# Patient Record
Sex: Female | Born: 1985 | Race: Black or African American | Hispanic: No | Marital: Married | Smoking: Never smoker
Health system: Southern US, Community
[De-identification: ages and names within clinical notes are randomized; demographics above are authoritative.]

## PROBLEM LIST (undated history)

## (undated) ENCOUNTER — Inpatient Hospital Stay (HOSPITAL_COMMUNITY): Payer: Self-pay

## (undated) DIAGNOSIS — F32A Depression, unspecified: Secondary | ICD-10-CM

## (undated) DIAGNOSIS — F419 Anxiety disorder, unspecified: Secondary | ICD-10-CM

## (undated) DIAGNOSIS — K219 Gastro-esophageal reflux disease without esophagitis: Secondary | ICD-10-CM

## (undated) DIAGNOSIS — R87629 Unspecified abnormal cytological findings in specimens from vagina: Secondary | ICD-10-CM

## (undated) DIAGNOSIS — B009 Herpesviral infection, unspecified: Secondary | ICD-10-CM

## (undated) HISTORY — DX: Gastro-esophageal reflux disease without esophagitis: K21.9

## (undated) HISTORY — DX: Anxiety disorder, unspecified: F41.9

## (undated) HISTORY — PX: WISDOM TOOTH EXTRACTION: SHX21

## (undated) HISTORY — PX: CERVICAL BIOPSY  W/ LOOP ELECTRODE EXCISION: SUR135

## (undated) HISTORY — DX: Herpesviral infection, unspecified: B00.9

---

## 2013-04-21 ENCOUNTER — Encounter (HOSPITAL_COMMUNITY): Payer: Self-pay | Admitting: Emergency Medicine

## 2013-04-21 ENCOUNTER — Emergency Department (HOSPITAL_COMMUNITY)
Admission: EM | Admit: 2013-04-21 | Discharge: 2013-04-21 | Disposition: A | Discharge status: Home / Self Care | Payer: 59 | Attending: Emergency Medicine | Admitting: Emergency Medicine

## 2013-04-21 DIAGNOSIS — Z3202 Encounter for pregnancy test, result negative: Secondary | ICD-10-CM | POA: Insufficient documentation

## 2013-04-21 DIAGNOSIS — R197 Diarrhea, unspecified: Secondary | ICD-10-CM | POA: Insufficient documentation

## 2013-04-21 DIAGNOSIS — B349 Viral infection, unspecified: Secondary | ICD-10-CM

## 2013-04-21 DIAGNOSIS — R5381 Other malaise: Secondary | ICD-10-CM | POA: Insufficient documentation

## 2013-04-21 DIAGNOSIS — R5383 Other fatigue: Secondary | ICD-10-CM

## 2013-04-21 DIAGNOSIS — B9789 Other viral agents as the cause of diseases classified elsewhere: Secondary | ICD-10-CM | POA: Insufficient documentation

## 2013-04-21 LAB — COMPREHENSIVE METABOLIC PANEL
ALBUMIN: 3.5 g/dL (ref 3.5–5.2)
ALT: 21 U/L (ref 0–35)
AST: 21 U/L (ref 0–37)
Alkaline Phosphatase: 57 U/L (ref 39–117)
BUN: 12 mg/dL (ref 6–23)
CALCIUM: 9.2 mg/dL (ref 8.4–10.5)
CO2: 21 meq/L (ref 19–32)
CREATININE: 0.77 mg/dL (ref 0.50–1.10)
Chloride: 99 mEq/L (ref 96–112)
GFR calc Af Amer: >90 mL/min (ref >90)
Glucose, Bld: 88 mg/dL (ref 70–99)
Potassium: 3.7 mEq/L (ref 3.7–5.3)
SODIUM: 136 meq/L — AB (ref 137–147)
TOTAL PROTEIN: 7.6 g/dL (ref 6.0–8.3)
Total Bilirubin: 0.2 mg/dL — ABNORMAL LOW (ref 0.3–1.2)

## 2013-04-21 LAB — CBC WITH DIFFERENTIAL/PLATELET
BASOS ABS: 0 10*3/uL (ref 0.0–0.1)
Basophils Relative: 0 % (ref 0–1)
EOS ABS: 0 10*3/uL (ref 0.0–0.7)
Eosinophils Relative: 1 % (ref 0–5)
HCT: 40.8 % (ref 36.0–46.0)
Hemoglobin: 14 g/dL (ref 12.0–15.0)
LYMPHS PCT: 25 % (ref 12–46)
Lymphs Abs: 1.6 10*3/uL (ref 0.7–4.0)
MCH: 30.2 pg (ref 26.0–34.0)
MCHC: 34.3 g/dL (ref 30.0–36.0)
MCV: 88.1 fL (ref 78.0–100.0)
Monocytes Absolute: 0.6 10*3/uL (ref 0.1–1.0)
Monocytes Relative: 10 % (ref 3–12)
Neutro Abs: 4.2 10*3/uL (ref 1.7–7.7)
Neutrophils Relative %: 64 % (ref 43–77)
PLATELETS: 191 10*3/uL (ref 150–400)
RBC: 4.63 MIL/uL (ref 3.87–5.11)
RDW: 12.4 % (ref 11.5–15.5)
WBC: 6.5 10*3/uL (ref 4.0–10.5)

## 2013-04-21 LAB — LIPASE, BLOOD: Lipase: 21 U/L (ref 11–59)

## 2013-04-21 LAB — URINALYSIS, ROUTINE W REFLEX MICROSCOPIC
Bilirubin Urine: NEGATIVE
Glucose, UA: NEGATIVE mg/dL
Hgb urine dipstick: NEGATIVE
Ketones, ur: NEGATIVE mg/dL
LEUKOCYTES UA: NEGATIVE
Nitrite: NEGATIVE
PH: 6 (ref 5.0–8.0)
PROTEIN: NEGATIVE mg/dL
Specific Gravity, Urine: 1.022 (ref 1.005–1.030)
Urobilinogen, UA: 0.2 mg/dL (ref 0.0–1.0)

## 2013-04-21 LAB — PREGNANCY, URINE: Preg Test, Ur: NEGATIVE

## 2013-04-21 MED ORDER — LOPERAMIDE HCL 2 MG PO CAPS: 2.0000 mg | ORAL_CAPSULE | ORAL | Status: DC | PRN

## 2013-04-21 MED ORDER — SODIUM CHLORIDE 0.9 % IV SOLN
INTRAVENOUS | Status: DC
  Administered 2013-04-21: 13:00:00 via INTRAVENOUS

## 2013-04-21 MED ORDER — ONDANSETRON 8 MG PO TBDP: 8.0000 mg | ORAL_TABLET | Freq: Three times a day (TID) | ORAL | Status: DC | PRN

## 2013-04-21 MED ORDER — ONDANSETRON HCL 4 MG/2ML IJ SOLN
4.0000 mg | Freq: Once | INTRAMUSCULAR | Status: AC
  Administered 2013-04-21: 4 mg via INTRAVENOUS
  Filled 2013-04-21: qty 2

## 2013-04-21 MED ORDER — SODIUM CHLORIDE 0.9 % IV BOLUS (SEPSIS)
1000.0000 mL | Freq: Once | INTRAVENOUS | Status: AC
  Administered 2013-04-21: 1000 mL via INTRAVENOUS

## 2013-04-21 NOTE — Discharge Instructions (Signed)
Viral Gastroenteritis Viral gastroenteritis is also known as stomach flu. This condition affects the stomach and intestinal tract. It can cause sudden diarrhea and vomiting. The illness typically lasts 3 to 8 days. Most people develop an immune response that eventually gets rid of the virus. While this natural response develops, the virus can make you quite ill. CAUSES  Many different viruses can cause gastroenteritis, such as rotavirus or noroviruses. You can catch one of these viruses by consuming contaminated food or water. You may also catch a virus by sharing utensils or other personal items with an infected person or by touching a contaminated surface. SYMPTOMS  The most common symptoms are diarrhea and vomiting. These problems can cause a severe loss of body fluids (dehydration) and a body salt (electrolyte) imbalance. Other symptoms may include:  Fever.  Headache.  Fatigue.  Abdominal pain. DIAGNOSIS  Your caregiver can usually diagnose viral gastroenteritis based on your symptoms and a physical exam. A stool sample may also be taken to test for the presence of viruses or other infections. TREATMENT  This illness typically goes away on its own. Treatments are aimed at rehydration. The most serious cases of viral gastroenteritis involve vomiting so severely that you are not able to keep fluids down. In these cases, fluids must be given through an intravenous line (IV). HOME CARE INSTRUCTIONS   Drink enough fluids to keep your urine clear or pale yellow. Drink small amounts of fluids frequently and increase the amounts as tolerated.  Ask your caregiver for specific rehydration instructions.  Avoid:  Foods high in sugar.  Alcohol.  Carbonated drinks.  Tobacco.  Juice.  Caffeine drinks.  Extremely hot or cold fluids.  Fatty, greasy foods.  Too much intake of anything at one time.  Dairy products until 24 to 48 hours after diarrhea stops.  You may consume probiotics.  Probiotics are active cultures of beneficial bacteria. They may lessen the amount and number of diarrheal stools in adults. Probiotics can be found in yogurt with active cultures and in supplements.  Wash your hands well to avoid spreading the virus.  Only take over-the-counter or prescription medicines for pain, discomfort, or fever as directed by your caregiver. Do not give aspirin to children. Antidiarrheal medicines are not recommended.  Ask your caregiver if you should continue to take your regular prescribed and over-the-counter medicines.  Keep all follow-up appointments as directed by your caregiver. SEEK IMMEDIATE MEDICAL CARE IF:   You are unable to keep fluids down.  You do not urinate at least once every 6 to 8 hours.  You develop shortness of breath.  You notice blood in your stool or vomit. This may look like coffee grounds.  You have abdominal pain that increases or is concentrated in one small area (localized).  You have persistent vomiting or diarrhea.  You have a fever.  The patient is a child younger than 3 months, and he or she has a fever.  The patient is a child older than 3 months, and he or she has a fever and persistent symptoms.  The patient is a child older than 3 months, and he or she has a fever and symptoms suddenly get worse.  The patient is a baby, and he or she has no tears when crying. MAKE SURE YOU:   Understand these instructions.  Will watch your condition.  Will get help right away if you are not doing well or get worse. Document Released: 02/05/2005 Document Revised: 04/30/2011 Document Reviewed: 11/22/2010   [employer] Patient Information 2014 [employer]  

## 2013-04-21 NOTE — ED Notes (Signed)
Pt states taht she has been having NV since Saturday and diarrhea that started today.

## 2013-04-21 NOTE — ED Provider Notes (Signed)
CSN: [id number]     Arrival date & time 04/21/13  1125 History   First MD Initiated Contact with Patient 04/21/13 1142     Chief Complaint  Patient presents with  . Nausea  . Emesis  . Diarrhea     (Consider location/radiation/quality/duration/timing/severity/associated sxs/prior Treatment) Patient is a 28 y.o. female presenting with vomiting and diarrhea. The history is provided by the patient.  Emesis Associated symptoms: diarrhea   Diarrhea Associated symptoms: vomiting    patient here with 3 days of watery diarrhea and emesis x1. Notes some weakness without fever or chills. No URI symptoms. Denies any dysuria or hematuria. Had a normal menstrual cycle week ago. Possible bad food exposure. Symptoms persisted and no treatment used for them prior to arrival. No prior history of same. Denies any rashes. Denies any syncope or near-syncope.  History reviewed. No pertinent past medical history. History reviewed. No pertinent past surgical history. History reviewed. No pertinent family history. History  Substance Use Topics  . Smoking status: Never Smoker   . Smokeless tobacco: Not on file  . Alcohol Use: No   OB History   Grav Para Term Preterm Abortions TAB SAB Ect Mult Living                 Review of Systems  Gastrointestinal: Positive for vomiting and diarrhea.  All other systems reviewed and are negative.      Allergies  Review of patient's allergies indicates no known allergies.  Home Medications   Current Outpatient Rx  Name  Route  Sig  Dispense  Refill  . etonogestrel-ethinyl estradiol (NUVARING) 0.12-0.015 MG/24HR vaginal ring   Vaginal   Place 1 each vaginally every 28 (twenty-eight) days. Insert vaginally and leave in place for 3 consecutive weeks, then remove for 1 week.         . Multiple Vitamins-Minerals (MULTIVITAMIN WITH MINERALS) tablet   Oral   Take 1 tablet by mouth daily.         . valACYclovir (VALTREX) 500 MG tablet   Oral   Take 500  mg by mouth daily as needed (outbreak).          BP 127/75  Pulse 106  Temp(Src) 98.4 F (36.9 C) (Oral)  Resp 18  SpO2 97%  LMP 04/16/2013 Physical Exam  Nursing note and vitals reviewed. Constitutional: She is oriented to person, place, and time. She appears well-developed and well-nourished.  Non-toxic appearance. No distress.  HENT:  Head: Normocephalic and atraumatic.  Eyes: Conjunctivae, EOM and lids are normal. Pupils are equal, round, and reactive to light.  Neck: Normal range of motion. Neck supple. No tracheal deviation present. No mass present.  Cardiovascular: Normal rate, regular rhythm and normal heart sounds.  Exam reveals no gallop.   No murmur heard. Pulmonary/Chest: Effort normal and breath sounds normal. No stridor. No respiratory distress. She has no decreased breath sounds. She has no wheezes. She has no rhonchi. She has no rales.  Abdominal: Soft. Normal appearance and bowel sounds are normal. She exhibits no distension. There is no tenderness. There is no rebound and no CVA tenderness.  Musculoskeletal: Normal range of motion. She exhibits no edema and no tenderness.  Neurological: She is alert and oriented to person, place, and time. She has normal strength. No cranial nerve deficit or sensory deficit. GCS eye subscore is 4. GCS verbal subscore is 5. GCS motor subscore is 6.  Skin: Skin is warm and dry. No abrasion and no rash noted.  Psychiatric: She has a normal mood and affect. Her speech is normal and behavior is normal.    ED Course  Procedures (including critical care time) Labs Review Labs Reviewed - No data to display Imaging Review No results found.   EKG Interpretation None      MDM   Final diagnoses:  None    She was given IV fluids and anti-medics here. She feels better at this time. Suspect viral illness and she is stable for discharge    Toy Baker, MD 04/21/13 1407

## 2013-12-04 ENCOUNTER — Other Ambulatory Visit (HOSPITAL_COMMUNITY)
Admission: RE | Admit: 2013-12-04 | Discharge: 2013-12-04 | Disposition: A | Discharge status: Home / Self Care | Payer: 59 | Source: Ambulatory Visit | Attending: Obstetrics & Gynecology | Admitting: Obstetrics & Gynecology

## 2013-12-04 ENCOUNTER — Other Ambulatory Visit: Payer: Self-pay | Admitting: Obstetrics & Gynecology

## 2013-12-04 DIAGNOSIS — Z113 Encounter for screening for infections with a predominantly sexual mode of transmission: Secondary | ICD-10-CM | POA: Diagnosis present

## 2013-12-04 DIAGNOSIS — Z01411 Encounter for gynecological examination (general) (routine) with abnormal findings: Secondary | ICD-10-CM | POA: Insufficient documentation

## 2013-12-04 DIAGNOSIS — Z1151 Encounter for screening for human papillomavirus (HPV): Secondary | ICD-10-CM | POA: Diagnosis present

## 2013-12-07 LAB — CYTOLOGY - PAP

## 2015-11-04 ENCOUNTER — Other Ambulatory Visit (INDEPENDENT_AMBULATORY_CARE_PROVIDER_SITE_OTHER): Payer: 59

## 2015-11-04 ENCOUNTER — Ambulatory Visit (INDEPENDENT_AMBULATORY_CARE_PROVIDER_SITE_OTHER): Payer: 59 | Admitting: Nurse Practitioner

## 2015-11-04 ENCOUNTER — Encounter: Payer: Self-pay | Admitting: Nurse Practitioner

## 2015-11-04 VITALS — BP 112/80 | HR 84 | Ht 64.0 in | Wt 184.0 lb

## 2015-11-04 DIAGNOSIS — G4700 Insomnia, unspecified: Secondary | ICD-10-CM

## 2015-11-04 DIAGNOSIS — Z0001 Encounter for general adult medical examination with abnormal findings: Secondary | ICD-10-CM | POA: Diagnosis not present

## 2015-11-04 DIAGNOSIS — R6889 Other general symptoms and signs: Secondary | ICD-10-CM

## 2015-11-04 DIAGNOSIS — Z0000 Encounter for general adult medical examination without abnormal findings: Secondary | ICD-10-CM

## 2015-11-04 DIAGNOSIS — F40243 Fear of flying: Secondary | ICD-10-CM

## 2015-11-04 DIAGNOSIS — F339 Major depressive disorder, recurrent, unspecified: Secondary | ICD-10-CM | POA: Insufficient documentation

## 2015-11-04 DIAGNOSIS — F411 Generalized anxiety disorder: Secondary | ICD-10-CM | POA: Insufficient documentation

## 2015-11-04 LAB — LIPID PANEL
CHOL/HDL RATIO: 3
Cholesterol: 180 mg/dL (ref 0–200)
HDL: 55.4 mg/dL (ref >39.00)
LDL CALC: 114 mg/dL — AB (ref 0–99)
NonHDL: 124.34
Triglycerides: 54 mg/dL (ref 0.0–149.0)
VLDL: 10.8 mg/dL (ref 0.0–40.0)

## 2015-11-04 LAB — TSH: TSH: 1.87 u[IU]/mL (ref 0.35–4.50)

## 2015-11-04 LAB — CBC WITH DIFFERENTIAL/PLATELET
BASOS ABS: 0 10*3/uL (ref 0.0–0.1)
Basophils Relative: 0.3 % (ref 0.0–3.0)
EOS ABS: 0.1 10*3/uL (ref 0.0–0.7)
Eosinophils Relative: 1.2 % (ref 0.0–5.0)
HCT: 40 % (ref 36.0–46.0)
Hemoglobin: 13.6 g/dL (ref 12.0–15.0)
Lymphocytes Relative: 38.1 % (ref 12.0–46.0)
Lymphs Abs: 3.5 10*3/uL (ref 0.7–4.0)
MCHC: 33.9 g/dL (ref 30.0–36.0)
MCV: 90 fl (ref 78.0–100.0)
MONOS PCT: 6.9 % (ref 3.0–12.0)
Monocytes Absolute: 0.6 10*3/uL (ref 0.1–1.0)
NEUTROS ABS: 4.9 10*3/uL (ref 1.4–7.7)
Neutrophils Relative %: 53.5 % (ref 43.0–77.0)
PLATELETS: 291 10*3/uL (ref 150.0–400.0)
RBC: 4.44 Mil/uL (ref 3.87–5.11)
RDW: 13.8 % (ref 11.5–15.5)
WBC: 9.2 10*3/uL (ref 4.0–10.5)

## 2015-11-04 LAB — COMPREHENSIVE METABOLIC PANEL
ALT: 12 U/L (ref 0–35)
AST: 14 U/L (ref 0–37)
Albumin: 4.1 g/dL (ref 3.5–5.2)
Alkaline Phosphatase: 58 U/L (ref 39–117)
BILIRUBIN TOTAL: 0.4 mg/dL (ref 0.2–1.2)
BUN: 11 mg/dL (ref 6–23)
CHLORIDE: 103 meq/L (ref 96–112)
CO2: 29 meq/L (ref 19–32)
Calcium: 9.2 mg/dL (ref 8.4–10.5)
Creatinine, Ser: 0.86 mg/dL (ref 0.40–1.20)
GFR: 100.75 mL/min (ref >60.00)
GLUCOSE: 89 mg/dL (ref 70–99)
Potassium: 4.3 mEq/L (ref 3.5–5.1)
Sodium: 139 mEq/L (ref 135–145)
Total Protein: 7.7 g/dL (ref 6.0–8.3)

## 2015-11-04 LAB — HEMOGLOBIN A1C: Hgb A1c MFr Bld: 5.4 % (ref 4.6–6.5)

## 2015-11-04 MED ORDER — SUVOREXANT 10 MG PO TABS: 10.0000 mg | ORAL_TABLET | Freq: Every evening | ORAL | 1 refills | Status: DC | PRN

## 2015-11-04 NOTE — Patient Instructions (Signed)
Insomnia Insomnia is a sleep disorder that makes it difficult to fall asleep or to stay asleep. Insomnia can cause tiredness (fatigue), low energy, difficulty concentrating, mood swings, and poor performance at work or school.  There are three different ways to classify insomnia:  Difficulty falling asleep.  Difficulty staying asleep.  Waking up too early in the morning. Any type of insomnia can be long-term (chronic) or short-term (acute). Both are common. Short-term insomnia usually lasts for three months or less. Chronic insomnia occurs at least three times a week for longer than three months. CAUSES  Insomnia may be caused by another condition, situation, or substance, such as:  Anxiety.  Certain medicines.  Gastroesophageal reflux disease (GERD) or other gastrointestinal conditions.  Asthma or other breathing conditions.  Restless legs syndrome, sleep apnea, or other sleep disorders.  Chronic pain.  Menopause. This may include hot flashes.  Stroke.  Abuse of alcohol, tobacco, or illegal drugs.  Depression.  Caffeine.   Neurological disorders, such as Alzheimer disease.  An overactive thyroid (hyperthyroidism). The cause of insomnia may not be known. RISK FACTORS Risk factors for insomnia include:  Gender. Women are more commonly affected than men.  Age. Insomnia is more common as you get older.  Stress. This may involve your professional or personal life.  Income. Insomnia is more common in people with lower income.  Lack of exercise.   Irregular work schedule or night shifts.  Traveling between different time zones. SIGNS AND SYMPTOMS If you have insomnia, trouble falling asleep or trouble staying asleep is the main symptom. This may lead to other symptoms, such as:  Feeling fatigued.  Feeling nervous about going to sleep.  Not feeling rested in the morning.  Having trouble concentrating.  Feeling irritable, anxious, or depressed. TREATMENT   Treatment for insomnia depends on the cause. If your insomnia is caused by an underlying condition, treatment will focus on addressing the condition. Treatment may also include:   Medicines to help you sleep.  Counseling or therapy.  Lifestyle adjustments. HOME CARE INSTRUCTIONS   Take medicines only as directed by your health care provider.  Keep regular sleeping and waking hours. Avoid naps.  Keep a sleep diary to help you and your health care provider figure out what could be causing your insomnia. Include:   When you sleep.  When you wake up during the night.  How well you sleep.   How rested you feel the next day.  Any side effects of medicines you are taking.  What you eat and drink.   Make your bedroom a comfortable place where it is easy to fall asleep:  Put up shades or special blackout curtains to block light from outside.  Use a white noise machine to block noise.  Keep the temperature cool.   Exercise regularly as directed by your health care provider. Avoid exercising right before bedtime.  Use relaxation techniques to manage stress. Ask your health care provider to suggest some techniques that may work well for you. These may include:  Breathing exercises.  Routines to release muscle tension.  Visualizing peaceful scenes.  Cut back on alcohol, caffeinated beverages, and cigarettes, especially close to bedtime. These can disrupt your sleep.  Do not overeat or eat spicy foods right before bedtime. This can lead to digestive discomfort that can make it hard for you to sleep.  Limit screen use before bedtime. This includes:  Watching TV.  Using your smartphone, tablet, and computer.  Stick to a routine. This  can help you fall asleep faster. Try to do a quiet activity, brush your teeth, and go to bed at the same time each night.  Get out of bed if you are still awake after 15 minutes of trying to sleep. Keep the lights down, but try reading or  doing a quiet activity. When you feel sleepy, go back to bed.  Make sure that you drive carefully. Avoid driving if you feel very sleepy.  Keep all follow-up appointments as directed by your health care provider. This is important. SEEK MEDICAL CARE IF:   You are tired throughout the day or have trouble in your daily routine due to sleepiness.  You continue to have sleep problems or your sleep problems get worse. SEEK IMMEDIATE MEDICAL CARE IF:   You have serious thoughts about hurting yourself or someone else.   This information is not intended to replace advice given to you by your health care provider. Make sure you discuss any questions you have with your health care provider.   Document Released: 02/03/2000 Document Revised: 10/27/2014 Document Reviewed: 11/06/2013 Elsevier Interactive Patient Education 2016 [place] Maintenance, Female Adopting a healthy lifestyle and getting preventive care can go a long way to promote health and wellness. Talk with your health care provider about what schedule of regular examinations is right for you. This is a good chance for you to check in with your provider about disease prevention and staying healthy. In between checkups, there are plenty of things you can do on your own. Experts have done a lot of research about which lifestyle changes and preventive measures are most likely to keep you healthy. Ask your health care provider for more information. WEIGHT AND DIET  Eat a healthy diet  Be sure to include plenty of vegetables, fruits, low-fat dairy products, and lean protein.  Do not eat a lot of foods high in solid fats, added sugars, or salt.  Get regular exercise. This is one of the most important things you can do for your health.  Most adults should exercise for at least 150 minutes each week. The exercise should increase your heart rate and make you sweat (moderate-intensity exercise).  Most adults should also do  strengthening exercises at least twice a week. This is in addition to the moderate-intensity exercise.  Maintain a healthy weight  Body mass index (BMI) is a measurement that can be used to identify possible weight problems. It estimates body fat based on height and weight. Your health care provider can help determine your BMI and help you achieve or maintain a healthy weight.  For females 68 years of age and older:   A BMI below 18.5 is considered underweight.  A BMI of 18.5 to 24.9 is normal.  A BMI of 25 to 29.9 is considered overweight.  A BMI of 30 and above is considered obese.  Watch levels of cholesterol and blood lipids  You should start having your blood tested for lipids and cholesterol at 28 years of age, then have this test every 5 years.  You may need to have your cholesterol levels checked more often if:  Your lipid or cholesterol levels are high.  You are older than 28 years of age.  You are at high risk for heart disease.  CANCER SCREENING   Lung Cancer  Lung cancer screening is recommended for adults 20-6 years old who are at high risk for lung cancer because of a history of smoking.  A yearly low-dose  CT scan of the lungs is recommended for people who:  Currently smoke.  Have quit within the past 15 years.  Have at least a 30-pack-year history of smoking. A pack year is smoking an average of one pack of cigarettes a day for 1 year.  Yearly screening should continue until it has been 15 years since you quit.  Yearly screening should stop if you develop a health problem that would prevent you from having lung cancer treatment.  Breast Cancer  Practice breast self-awareness. This means understanding how your breasts normally appear and feel.  It also means doing regular breast self-exams. Let your health care provider know about any changes, no matter how small.  If you are in your 20s or 30s, you should have a clinical breast exam (CBE) by a  health care provider every 1-3 years as part of a regular health exam.  If you are 9 or older, have a CBE every year. Also consider having a breast X-ray (mammogram) every year.  If you have a family history of breast cancer, talk to your health care provider about genetic screening.  If you are at high risk for breast cancer, talk to your health care provider about having an MRI and a mammogram every year.  Breast cancer gene (BRCA) assessment is recommended for women who have family members with BRCA-related cancers. BRCA-related cancers include:  Breast.  Ovarian.  Tubal.  Peritoneal cancers.  Results of the assessment will determine the need for genetic counseling and BRCA1 and BRCA2 testing. Cervical Cancer Your health care provider may recommend that you be screened regularly for cancer of the pelvic organs (ovaries, uterus, and vagina). This screening involves a pelvic examination, including checking for microscopic changes to the surface of your cervix (Pap test). You may be encouraged to have this screening done every 3 years, beginning at age 67.  For women ages 31-65, health care providers may recommend pelvic exams and Pap testing every 3 years, or they may recommend the Pap and pelvic exam, combined with testing for human papilloma virus (HPV), every 5 years. Some types of HPV increase your risk of cervical cancer. Testing for HPV may also be done on women of any age with unclear Pap test results.  Other health care providers may not recommend any screening for nonpregnant women who are considered low risk for pelvic cancer and who do not have symptoms. Ask your health care provider if a screening pelvic exam is right for you.  If you have had past treatment for cervical cancer or a condition that could lead to cancer, you need Pap tests and screening for cancer for at least 20 years after your treatment. If Pap tests have been discontinued, your risk factors (such as having a  new sexual partner) need to be reassessed to determine if screening should resume. Some women have medical problems that increase the chance of getting cervical cancer. In these cases, your health care provider may recommend more frequent screening and Pap tests. Colorectal Cancer  This type of cancer can be detected and often prevented.  Routine colorectal cancer screening usually begins at 28 years of age and continues through 28 years of age.  Your health care provider may recommend screening at an earlier age if you have risk factors for colon cancer.  Your health care provider may also recommend using home test kits to check for hidden blood in the stool.  A small camera at the end of a tube can be  used to examine your colon directly (sigmoidoscopy or colonoscopy). This is done to check for the earliest forms of colorectal cancer.  Routine screening usually begins at age 27.  Direct examination of the colon should be repeated every 5-10 years through 28 years of age. However, you may need to be screened more often if early forms of precancerous polyps or small growths are found. Skin Cancer  Check your skin from head to toe regularly.  Tell your health care provider about any new moles or changes in moles, especially if there is a change in a mole's shape or color.  Also tell your health care provider if you have a mole that is larger than the size of a pencil eraser.  Always use sunscreen. Apply sunscreen liberally and repeatedly throughout the day.  Protect yourself by wearing long sleeves, pants, a wide-brimmed hat, and sunglasses whenever you are outside. HEART DISEASE, DIABETES, AND HIGH BLOOD PRESSURE   High blood pressure causes heart disease and increases the risk of stroke. High blood pressure is more likely to develop in:  People who have blood pressure in the high end of the normal range (130-139/85-89 mm Hg).  People who are overweight or obese.  People who are  African American.  If you are 68-17 years of age, have your blood pressure checked every 3-5 years. If you are 107 years of age or older, have your blood pressure checked every year. You should have your blood pressure measured twice--once when you are at a hospital or clinic, and once when you are not at a hospital or clinic. Record the average of the two measurements. To check your blood pressure when you are not at a hospital or clinic, you can use:  An automated blood pressure machine at a pharmacy.  A home blood pressure monitor.  If you are between 1 years and 74 years old, ask your health care provider if you should take aspirin to prevent strokes.  Have regular diabetes screenings. This involves taking a blood sample to check your fasting blood sugar level.  If you are at a normal weight and have a low risk for diabetes, have this test once every three years after 28 years of age.  If you are overweight and have a high risk for diabetes, consider being tested at a younger age or more often. PREVENTING INFECTION  Hepatitis B  If you have a higher risk for hepatitis B, you should be screened for this virus. You are considered at high risk for hepatitis B if:  You were born in a country where hepatitis B is common. Ask your health care provider which countries are considered high risk.  Your parents were born in a high-risk country, and you have not been immunized against hepatitis B (hepatitis B vaccine).  You have HIV or AIDS.  You use needles to inject street drugs.  You live with someone who has hepatitis B.  You have had sex with someone who has hepatitis B.  You get hemodialysis treatment.  You take certain medicines for conditions, including cancer, organ transplantation, and autoimmune conditions. Hepatitis C  Blood testing is recommended for:  Everyone born from [birth year] through 1963.  Anyone with known risk factors for hepatitis C. Sexually transmitted infections  (STIs)  You should be screened for sexually transmitted infections (STIs) including gonorrhea and chlamydia if:  You are sexually active and are younger than 28 years of age.  You are older than 28 years of age and  your health care provider tells you that you are at risk for this type of infection.  Your sexual activity has changed since you were last screened and you are at an increased risk for chlamydia or gonorrhea. Ask your health care provider if you are at risk.  If you do not have HIV, but are at risk, it may be recommended that you take a prescription medicine daily to prevent HIV infection. This is called pre-exposure prophylaxis (PrEP). You are considered at risk if:  You are sexually active and do not regularly use condoms or know the HIV status of your partner(s).  You take drugs by injection.  You are sexually active with a partner who has HIV. Talk with your health care provider about whether you are at high risk of being infected with HIV. If you choose to begin PrEP, you should first be tested for HIV. You should then be tested every 3 months for as long as you are taking PrEP.  PREGNANCY   If you are premenopausal and you may become pregnant, ask your health care provider about preconception counseling.  If you may become pregnant, take 400 to 800 micrograms (mcg) of folic acid every day.  If you want to prevent pregnancy, talk to your health care provider about birth control (contraception). OSTEOPOROSIS AND MENOPAUSE   Osteoporosis is a disease in which the bones lose minerals and strength with aging. This can result in serious bone fractures. Your risk for osteoporosis can be identified using a bone density scan.  If you are 71 years of age or older, or if you are at risk for osteoporosis and fractures, ask your health care provider if you should be screened.  Ask your health care provider whether you should take a calcium or vitamin D supplement to lower your risk  for osteoporosis.  Menopause may have certain physical symptoms and risks.  Hormone replacement therapy may reduce some of these symptoms and risks. Talk to your health care provider about whether hormone replacement therapy is right for you.  HOME CARE INSTRUCTIONS   Schedule regular health, dental, and eye exams.  Stay current with your immunizations.   Do not use any tobacco products including cigarettes, chewing tobacco, or electronic cigarettes.  If you are pregnant, do not drink alcohol.  If you are breastfeeding, limit how much and how often you drink alcohol.  Limit alcohol intake to no more than 1 drink per day for nonpregnant women. One drink equals 12 ounces of beer, 5 ounces of wine, or 1 ounces of hard liquor.  Do not use street drugs.  Do not share needles.  Ask your health care provider for help if you need support or information about quitting drugs.  Tell your health care provider if you often feel depressed.  Tell your health care provider if you have ever been abused or do not feel safe at home.   This information is not intended to replace advice given to you by your health care provider. Make sure you discuss any questions you have with your health care provider.   Document Released: 08/21/2010 Document Revised: 02/26/2014 Document Reviewed: 01/07/2013 Elsevier Interactive Patient Education [employer].

## 2015-11-04 NOTE — Assessment & Plan Note (Signed)
Normal TSH, CBC and CMP. epworth sleepiness scale of 0. Start Belsomra. Follow-up in 6 weeks.

## 2015-11-04 NOTE — Assessment & Plan Note (Signed)
Prescribe Xanax 0.25mg  (#4tabs) during next office visit

## 2015-11-04 NOTE — Progress Notes (Signed)
Subjective:    Patient ID: Amy [name], female    DOB: 10/04/1985, 30 y.o.   MRN: [id number]  Patient presents today for complete physical or establish care (new patient) and insomnia  Insomnia  Primary symptoms: fragmented sleep, sleep disturbance, no difficulty falling asleep, no somnolence, frequent awakening, premature morning awakening, no malaise/fatigue, napping.   The current episode started more than one month (over 4yrs). The onset quality is gradual. The problem occurs nightly. The problem is unchanged. The symptoms are aggravated by work stress and caffeine. How many beverages per day that contain caffeine: 0 - 1.  Types of beverages you drink: coffee. Past treatments include medication (benadryl and melatonin). The treatment provided no relief. Typical bedtime:  Shift work-varies.  How long after going to bed to you fall asleep: 15-30 minutes.   Duration of naps:  Two to four hours.  PMH includes: no hypertension, no depression, no family stress or anxiety, no restless leg syndrome, work related stressors, no chronic pain, no apnea. Prior diagnostic workup includes:  No prior workup.  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 Has difficulty staying at asleep even on her days off.  Also reports the fear of flying; associated symptoms are diaphoresis, palpitations, throat fullness, and uncontrollable crying. Has an upcoming trip to last status in November, will like to have a prescription to help cope with trip. Denies any suicide or homicidal ideation, no history depression, no history of substance abuse.  Immunizations: (TDAP, Hep C screen, Pneumovax, Influenza, zoster)  Health Maintenance  Topic Date Due  . Flu Shot  05/19/2016*  . HIV Screening  10/20/2016*  . Tetanus Vaccine  10/26/2016*  . Pap Smear  11/21/2017  *Topic was postponed. The date shown is not the original due date.   Diet:heart healthy Weight: not concerned Wt Readings  from Last 3 Encounters:  11/04/15 184 lb (83.5 kg)   Exercise:3-4times a week, running, she is training for 5K coming up in October 2017 Home Safety:no concern Depression/Suicide:denies No flowsheet data found. No flowsheet data found. Pap Smear (every 75yrs for >21-29 without HPV, every 36yrs for >30-25yrs with HPV):up to date, last done 2016 (normal per patient), had an abnormal 67yrs ago, LIPS procedure done. Vision:up to date, corrective lens used Dental:up todate Sexual History (birth control, marital status, STD):married, no children, sexually active, use of condoms  Medications and allergies reviewed with patient and updated if appropriate.  Patient Active Problem List   Diagnosis Date Noted  . Insomnia 11/04/2015  . 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 Multiple Vitamins-Minerals (MULTIVITAMIN WITH MINERALS) tablet Take 1 tablet by mouth daily.    . valACYclovir (VALTREX) 500 MG tablet Take 500 mg by mouth daily as needed (outbreak).    . ondansetron (ZOFRAN ODT) 8 MG disintegrating tablet Take 1 tablet (8 mg total) by mouth every 8 (eight) hours as needed for nausea or vomiting. (Patient not taking: Reported on 11/04/2015) 20 tablet 0   No current facility-administered medications on file prior to visit.     Past Medical History:  Diagnosis Date  . Anxiety    with flying only  . Herpes     Past Surgical History:  Procedure Laterality Date  . CERVICAL BIOPSY  W/ LOOP ELECTRODE EXCISION      Social History   Social History  . Marital status: Married  Spouse name: N/A  . Number of children: N/A  . Years of education: N/A   Social History Main Topics  . Smoking status: Never Smoker  . Smokeless tobacco: Never Used  . Alcohol use Yes     Comment: social  . Drug use: No  . Sexual activity: Yes    Birth control/ protection: Condom   Other Topics Concern  . None   Social  History Narrative  . None    Family History  Problem Relation Age of Onset  . Hypertension Mother   . Hyperlipidemia Mother   . Hyperlipidemia Father   . Alcohol abuse Father   . Cirrhosis Father   . Hypertension Maternal Aunt   . Heart disease Paternal Grandmother         Review of Systems  Constitutional: Negative for fever, malaise/fatigue and weight loss.  HENT: Negative for congestion and sore throat.   Eyes:       Negative for visual changes  Respiratory: Negative for apnea, cough and shortness of breath.   Cardiovascular: Negative for chest pain, palpitations and leg swelling.  Gastrointestinal: Negative for abdominal pain, blood in stool, constipation, diarrhea, heartburn, melena, nausea and vomiting.  Genitourinary: Negative for dysuria, frequency and urgency.  Musculoskeletal: Negative for falls, joint pain and myalgias.  Skin: Negative for rash.  Neurological: Negative for dizziness, sensory change and headaches.  Endo/Heme/Allergies: Negative for environmental allergies. Does not bruise/bleed easily.  Psychiatric/Behavioral: Positive for sleep disturbance. Negative for depression, substance abuse and suicidal ideas. The patient is nervous/anxious and has insomnia.        Has anxiety and panic attacks when on plane. [place] upcoming [place]    Objective:   Vitals:   11/04/15 0858  BP: 112/80  Pulse: 84    Body mass index is 31.58 kg/m.   Physical Examination:  Physical Exam  Constitutional: She is oriented to person, place, and time and well-developed, well-nourished, and in no distress. No distress.  HENT:  Right Ear: External ear normal.  Left Ear: External ear normal.  Nose: Nose normal.  Mouth/Throat: No oropharyngeal exudate.  Eyes: Conjunctivae and EOM are normal. Pupils are equal, round, and reactive to light. No scleral icterus.  Neck: Normal range of motion. Neck supple. No thyromegaly present.  Cardiovascular: Normal  rate, regular rhythm, normal heart sounds and intact distal pulses.   Pulmonary/Chest: Effort normal and breath sounds normal. Right breast exhibits no mass, no nipple discharge and no skin change. Left breast exhibits no mass, no nipple discharge and no skin change. Breasts are symmetrical.  Abdominal: Soft. Bowel sounds are normal. She exhibits no distension. There is no tenderness.  Genitourinary: Rectum normal and vulva normal. Cervix exhibits no motion tenderness.  Musculoskeletal: Normal range of motion. She exhibits no edema or tenderness.  Lymphadenopathy:    She has no cervical adenopathy.  Neurological: She is alert and oriented to person, place, and time. Gait normal.  Skin: Skin is warm and dry.  Psychiatric: Affect and judgment normal.  Vitals reviewed.   ASSESSMENT and PLAN:  Amy [name] was seen today for new patient (initial visit).  Diagnoses and all orders for this visit:  Encounter for preventative adult health care exam with abnormal findings -     TSH; Future -     CBC w/Diff; Future -     Comprehensive metabolic panel; Future -     Lipid Profile; Future -     Hemoglobin A1c; Future  Insomnia -  Suvorexant (BELSOMRA) 10 MG TABS; Take 10 mg by mouth at bedtime as needed.  Fear of flying   Insomnia Normal TSH, CBC and CMP. epworth sleepiness scale of 0. Start Belsomra. Follow-up in 6 weeks.  Fear of flying Prescribe Xanax 0.25mg  (#4tabs) during next office visit     Follow up: Return in about 6 weeks (around 12/16/2015) for insominia, anxiety. Give prescription of xanax for panic attacks during flights.  Alysia Penna, NP

## 2015-11-04 NOTE — Progress Notes (Signed)
Abnormal Normal CBC. Normal CMP Hemoglobin A1c indicates prediabetes. Lipid panel normal except mild elevation of LDL. Encourage heart healthy diet, regular exercise. Will Repeat labs in 6 months.

## 2015-11-04 NOTE — Progress Notes (Signed)
Pre visit review using our clinic review tool, if applicable. No additional management support is needed unless otherwise documented below in the visit note. 

## 2015-12-16 ENCOUNTER — Ambulatory Visit (INDEPENDENT_AMBULATORY_CARE_PROVIDER_SITE_OTHER): Payer: 59 | Admitting: Nurse Practitioner

## 2015-12-16 ENCOUNTER — Encounter: Payer: Self-pay | Admitting: Nurse Practitioner

## 2015-12-16 VITALS — BP 108/76 | HR 88 | Temp 97.6°F | Ht 64.0 in | Wt 183.0 lb

## 2015-12-16 DIAGNOSIS — F40243 Fear of flying: Secondary | ICD-10-CM | POA: Diagnosis not present

## 2015-12-16 MED ORDER — ALPRAZOLAM 0.5 MG PO TABS: 0.5000 mg | ORAL_TABLET | Freq: Two times a day (BID) | ORAL | 0 refills | Status: DC | PRN

## 2015-12-16 NOTE — Progress Notes (Signed)
Subjective:  Patient ID: Amy [name], female    DOB: October 31, 1985  Age: 30 y.o. MRN: [id number]  CC: Medication Refill (Pt stated need medication )   Anxiety  Presents for initial visit. Onset was at an unknown time. The problem has been unchanged. Symptoms include feeling of choking, hyperventilation, muscle tension, nausea, nervous/anxious behavior, palpitations, panic and restlessness. Patient reports no suicidal ideas. Episode frequency: when flying. The severity of symptoms is causing significant distress. The symptoms are aggravated by specific phobias (flying). The quality of sleep is good. Nighttime awakenings: none.   There are no known risk factors. Her past medical history is significant for anxiety/panic attacks. There is no history of asthma, chronic lung disease, depression or suicide attempts.  Amy [name] has an upcoming flight in 2 weeks to [facility] and another trip to [place] next year. In the past she has not been able to tolerate trips by plane without significant distress.  Insomnia: Current use of Belsomra 10mg  which has been very helpful. She denies any adverse side effects. She reports improved sleep.  Outpatient Medications Prior to Visit  Medication Sig Dispense Refill  . Multiple Vitamins-Minerals (MULTIVITAMIN WITH MINERALS) tablet Take 1 tablet by mouth daily.    . Suvorexant (BELSOMRA) 10 MG TABS Take 10 mg by mouth at bedtime as needed. 30 tablet 1  . valACYclovir (VALTREX) 500 MG tablet Take 500 mg by mouth daily as needed (outbreak).    . ondansetron (ZOFRAN ODT) 8 MG disintegrating tablet Take 1 tablet (8 mg total) by mouth every 8 (eight) hours as needed for nausea or vomiting. (Patient not taking: Reported on 11/04/2015) 20 tablet 0   No facility-administered medications prior to visit.     ROS See HPI  Objective:  BP 108/76 (BP Location: Left Arm, Patient Position: Sitting, Cuff Size: Normal)   Pulse 88   Temp 97.6 F (36.4 C)   Ht  5\' 4"  (1.626 m)   Wt 183 lb (83 kg)   SpO2 99%   BMI 31.41 kg/m   BP Readings from Last 3 Encounters:  12/16/15 108/76  11/04/15 112/80  04/21/13 113/61    Wt Readings from Last 3 Encounters:  12/16/15 183 lb (83 kg)  11/04/15 184 lb (83.5 kg)    Physical Exam  Constitutional: She is oriented to person, place, and time.  Neck: Normal range of motion. Neck supple.  Cardiovascular: Normal rate.   Pulmonary/Chest: Effort normal.  Musculoskeletal: Normal range of motion. She exhibits no edema.  Neurological: She is alert and oriented to person, place, and time.  Skin: Skin is warm and dry.  Psychiatric: She has a normal mood and affect. Her behavior is normal.  Vitals reviewed.   Lab Results  Component Value Date   WBC 9.2 11/04/2015   HGB 13.6 11/04/2015   HCT 40.0 11/04/2015   PLT 291.0 11/04/2015   GLUCOSE 89 11/04/2015   CHOL 180 11/04/2015   TRIG 54.0 11/04/2015   HDL 55.40 11/04/2015   LDLCALC 114 (H) 11/04/2015   ALT 12 11/04/2015   AST 14 11/04/2015   NA 139 11/04/2015   K 4.3 11/04/2015   CL 103 11/04/2015   CREATININE 0.86 11/04/2015   BUN 11 11/04/2015   CO2 29 11/04/2015   TSH 1.87 11/04/2015   HGBA1C 5.4 11/04/2015    No results found.  Assessment & Plan:   Amy [name] was seen today for medication refill.  Diagnoses and all orders for this visit:  Fear of  flying -     ALPRAZolam (XANAX) 0.5 MG tablet; Take 1 tablet (0.5 mg total) by mouth 2 (two) times daily as needed for anxiety.   I have discontinued [name]'s ondansetron. I am also having her start on ALPRAZolam. Additionally, I am having her maintain her valACYclovir, multivitamin with minerals, and Suvorexant.  Meds ordered this encounter  Medications  . ALPRAZolam (XANAX) 0.5 MG tablet    Sig: Take 1 tablet (0.5 mg total) by mouth 2 (two) times daily as needed for anxiety.    Dispense:  10 tablet    Refill:  0    Order Specific Question:   Supervising Provider    Answer:    Tresa GarterPLOTNIKOV, ALEKSEI V [[id number]]    Follow-up: Return if symptoms worsen or fail to improve.  Amy Pennaharlotte Louden Houseworth, NP

## 2015-12-16 NOTE — Progress Notes (Signed)
Pre visit review using our clinic review tool, if applicable. No additional management support is needed unless otherwise documented below in the visit note. 

## 2016-01-02 ENCOUNTER — Telehealth: Payer: Self-pay | Admitting: Emergency Medicine

## 2016-01-02 NOTE — Telephone Encounter (Signed)
Pt called and stated some of her medications is working right. She asked that you give her a call back to go over this with her. I informed her [name] is out of the office but would let the nurse know. Please advise thanks.

## 2016-01-03 NOTE — Telephone Encounter (Signed)
Please advise patient to make an office visit appointment for reevaluation of anxiety. Thank you

## 2016-01-03 NOTE — Telephone Encounter (Signed)
Patient called back stating that the alprazolam was not working.  States she has already started doubling dosage and that is not helping.  Would like call back on what to do.

## 2016-01-04 NOTE — Telephone Encounter (Signed)
Left message asking patient to call back to schedule appt.

## 2016-01-09 ENCOUNTER — Ambulatory Visit (INDEPENDENT_AMBULATORY_CARE_PROVIDER_SITE_OTHER): Payer: 59 | Admitting: Nurse Practitioner

## 2016-01-09 ENCOUNTER — Encounter: Payer: Self-pay | Admitting: Nurse Practitioner

## 2016-01-09 VITALS — BP 122/82 | HR 76 | Temp 97.0°F | Ht 64.0 in | Wt 188.0 lb

## 2016-01-09 DIAGNOSIS — F411 Generalized anxiety disorder: Secondary | ICD-10-CM

## 2016-01-09 DIAGNOSIS — Z30015 Encounter for initial prescription of vaginal ring hormonal contraceptive: Secondary | ICD-10-CM | POA: Diagnosis not present

## 2016-01-09 LAB — POCT URINE PREGNANCY: Preg Test, Ur: NEGATIVE

## 2016-01-09 MED ORDER — ESCITALOPRAM OXALATE 10 MG PO TABS: 10.0000 mg | ORAL_TABLET | Freq: Every day | ORAL | 3 refills | Status: DC

## 2016-01-09 MED ORDER — ETONOGESTREL-ETHINYL ESTRADIOL 0.12-0.015 MG/24HR VA RING: VAGINAL_RING | VAGINAL | 12 refills | Status: DC

## 2016-01-09 MED ORDER — CLONAZEPAM 0.5 MG PO TABS: 0.5000 mg | ORAL_TABLET | Freq: Two times a day (BID) | ORAL | 0 refills | Status: DC | PRN

## 2016-01-09 NOTE — Progress Notes (Signed)
Reviewed with patient in office. See office note

## 2016-01-09 NOTE — Progress Notes (Signed)
Pre visit review using our clinic review tool, if applicable. No additional management support is needed unless otherwise documented below in the visit note. 

## 2016-01-09 NOTE — Patient Instructions (Signed)
I instructed pt to start 1/2 tablet once daily for 1 week and then increase to a full tablet once daily on week two as tolerated.   We discussed common side effects such as nausea, drowsiness and weight gain.  Also discussed rare but serious side effect of suicide ideation.  She is instructed to discontinue medication go directly to ED if this occurs.  Pt verbalizes understanding.   Plan follow up in 1month to evaluate progress.     

## 2016-01-09 NOTE — Assessment & Plan Note (Signed)
uncotrolled with xanax prn. Start lexapro 10mg  daily, use klonopin prn x 1week only, referral to psychology. F/up in 33month

## 2016-01-09 NOTE — Progress Notes (Signed)
Subjective:  Patient ID: Amy [name], female    DOB: [birth year]  Age: 30 y.o. MRN: [id number]  CC: Anxiety (Pt stated her anxiety is getting worse.)   Anxiety  Presents for follow-up visit. Symptoms include decreased concentration, depressed mood, excessive worry, hyperventilation, insomnia, irritability, nervous/anxious behavior, panic and restlessness. Patient reports no chest pain, compulsions, confusion, dizziness, dry mouth, feeling of choking, impotence, malaise, muscle tension, nausea, obsessions, palpitations, shortness of breath or suicidal ideas. Symptoms occur most days. The severity of symptoms is interfering with daily activities. The quality of sleep is fair. Nighttime awakenings: none (none with use of belsomra prn).   Compliance with medications is 76-100%.  Had to increase xanax dose to 1.5mg  during flight to and from [facility].  has not taken xanax since return from [facility] last Thursday. States she had hard time enjoying her stay in [place] due to uncontrollable crying and feeling withdrawn.   Outpatient Medications Prior to Visit  Medication Sig Dispense Refill  . Multiple Vitamins-Minerals (MULTIVITAMIN WITH MINERALS) tablet Take 1 tablet by mouth daily.    . Suvorexant (BELSOMRA) 10 MG TABS Take 10 mg by mouth at bedtime as needed. 30 tablet 1  . valACYclovir (VALTREX) 500 MG tablet Take 500 mg by mouth daily as needed (outbreak).    . ALPRAZolam (XANAX) 0.5 MG tablet Take 1 tablet (0.5 mg total) by mouth 2 (two) times daily as needed for anxiety. 10 tablet 0   No facility-administered medications prior to visit.     ROS See HPI  Objective:  BP 122/82 (BP Location: Left Arm, Patient Position: Sitting, Cuff Size: Normal)   Pulse 76   Temp 97 F (36.1 C)   Ht 5\' 4"  (1.626 m)   Wt 188 lb (85.3 kg)   LMP 01/02/2016 (Approximate)   SpO2 98%   BMI 32.27 kg/m   BP Readings from Last 3 Encounters:  01/09/16 122/82  12/16/15 108/76  11/04/15 112/80      Wt Readings from Last 3 Encounters:  01/09/16 188 lb (85.3 kg)  12/16/15 183 lb (83 kg)  11/04/15 184 lb (83.5 kg)    Physical Exam  Constitutional: She is oriented to person, place, and time.  Neck: Normal range of motion. Neck supple.  Cardiovascular: Normal rate and normal heart sounds.   Pulmonary/Chest: Effort normal and breath sounds normal.  Musculoskeletal: She exhibits no edema.  Neurological: She is alert and oriented to person, place, and time.  Skin: Skin is warm and dry.  Psychiatric:  tearful  Vitals reviewed.   Lab Results  Component Value Date   WBC 9.2 11/04/2015   HGB 13.6 11/04/2015   HCT 40.0 11/04/2015   PLT 291.0 11/04/2015   GLUCOSE 89 11/04/2015   CHOL 180 11/04/2015   TRIG 54.0 11/04/2015   HDL 55.40 11/04/2015   LDLCALC 114 (H) 11/04/2015   ALT 12 11/04/2015   AST 14 11/04/2015   NA 139 11/04/2015   K 4.3 11/04/2015   CL 103 11/04/2015   CREATININE 0.86 11/04/2015   BUN 11 11/04/2015   CO2 29 11/04/2015   TSH 1.87 11/04/2015   HGBA1C 5.4 11/04/2015    No results found.  Assessment & Plan:   Amy [name] was seen today for anxiety.  Diagnoses and all orders for this visit:  Generalized anxiety disorder -     clonazePAM (KLONOPIN) 0.5 MG tablet; Take 1 tablet (0.5 mg total) by mouth 2 (two) times daily as needed for anxiety. -  escitalopram (LEXAPRO) 10 MG tablet; Take 1 tablet (10 mg total) by mouth daily. -     Ambulatory referral to Psychology  Encounter for initial prescription of vaginal ring hormonal contraceptive -     POCT urine pregnancy -     etonogestrel-ethinyl estradiol (NUVARING) 0.12-0.015 MG/24HR vaginal ring; Insert vaginally and leave in place for 3 consecutive weeks, then remove for 1 week.   I have discontinued [name]'s ALPRAZolam. I am also having her start on clonazePAM, escitalopram, and etonogestrel-ethinyl estradiol. Additionally, I am having her maintain her valACYclovir, multivitamin with  minerals, and Suvorexant.  Meds ordered this encounter  Medications  . clonazePAM (KLONOPIN) 0.5 MG tablet    Sig: Take 1 tablet (0.5 mg total) by mouth 2 (two) times daily as needed for anxiety.    Dispense:  14 tablet    Refill:  0    Order Specific Question:   Supervising Provider    Answer:   Tresa GarterPLOTNIKOV, ALEKSEI V [[id number]]  . escitalopram (LEXAPRO) 10 MG tablet    Sig: Take 1 tablet (10 mg total) by mouth daily.    Dispense:  30 tablet    Refill:  3    Order Specific Question:   Supervising Provider    Answer:   Tresa GarterPLOTNIKOV, ALEKSEI V [[id number]]  . etonogestrel-ethinyl estradiol (NUVARING) 0.12-0.015 MG/24HR vaginal ring    Sig: Insert vaginally and leave in place for 3 consecutive weeks, then remove for 1 week.    Dispense:  3 each    Refill:  12    Order Specific Question:   Supervising Provider    Answer:   Tresa GarterPLOTNIKOV, ALEKSEI V [[id number]]   Recent Results (from the past 2160 hour(s))  TSH     Status: None   Collection Time: 11/04/15 10:02 AM  Result Value Ref Range   TSH 1.87 0.35 - 4.50 uIU/mL  CBC w/Diff     Status: None   Collection Time: 11/04/15 10:02 AM  Result Value Ref Range   WBC 9.2 4.0 - 10.5 K/uL   RBC 4.44 3.87 - 5.11 Mil/uL   Hemoglobin 13.6 12.0 - 15.0 g/dL   HCT 11.940.0 14.736.0 - 82.946.0 %   MCV 90.0 78.0 - 100.0 fl   MCHC 33.9 30.0 - 36.0 g/dL   RDW 56.213.8 13.011.5 - 86.515.5 %   Platelets 291.0 150.0 - 400.0 K/uL   Neutrophils Relative % 53.5 43.0 - 77.0 %   Lymphocytes Relative 38.1 12.0 - 46.0 %   Monocytes Relative 6.9 3.0 - 12.0 %   Eosinophils Relative 1.2 0.0 - 5.0 %   Basophils Relative 0.3 0.0 - 3.0 %   Neutro Abs 4.9 1.4 - 7.7 K/uL   Lymphs Abs 3.5 0.7 - 4.0 K/uL   Monocytes Absolute 0.6 0.1 - 1.0 K/uL   Eosinophils Absolute 0.1 0.0 - 0.7 K/uL   Basophils Absolute 0.0 0.0 - 0.1 K/uL  Comprehensive metabolic panel     Status: None   Collection Time: 11/04/15 10:02 AM  Result Value Ref Range   Sodium 139 135 - 145 mEq/L   Potassium 4.3 3.5 - 5.1 mEq/L   Chloride  103 96 - 112 mEq/L   CO2 29 19 - 32 mEq/L   Glucose, Bld 89 70 - 99 mg/dL   BUN 11 6 - 23 mg/dL   Creatinine, Ser 7.840.86 0.40 - 1.20 mg/dL   Total Bilirubin 0.4 0.2 - 1.2 mg/dL   Alkaline Phosphatase 58 39 - 117 U/L   AST 14 0 -  37 U/L   ALT 12 0 - 35 U/L   Total Protein 7.7 6.0 - 8.3 g/dL   Albumin 4.1 3.5 - 5.2 g/dL   Calcium 9.2 8.4 - 16.110.5 mg/dL   GFR 096.04100.75 >54.09>60.00 mL/min  Lipid Profile     Status: Abnormal   Collection Time: 11/04/15 10:02 AM  Result Value Ref Range   Cholesterol 180 0 - 200 mg/dL    Comment: ATP III Classification       Desirable:  < 200 mg/dL               Borderline High:  200 - 239 mg/dL          High:  > = 811240 mg/dL   Triglycerides 91.454.0 0.0 - 149.0 mg/dL    Comment: Normal:  <782<150 mg/dLBorderline High:  150 - 199 mg/dL   HDL 95.6255.40 >13.08>39.00 mg/dL   VLDL 65.710.8 0.0 - 84.640.0 mg/dL   LDL Cholesterol 962114 (H) 0 - 99 mg/dL   Total CHOL/HDL Ratio 3     Comment:                Men          Women1/2 Average Risk     3.4          3.3Average Risk          5.0          4.42X Average Risk          9.6          7.13X Average Risk          15.0          11.0                       NonHDL 124.34     Comment: NOTE:  Non-HDL goal should be 30 mg/dL higher than patient's LDL goal (i.e. LDL goal of < 70 mg/dL, would have non-HDL goal of < 100 mg/dL)  Hemoglobin X5MA1c     Status: None   Collection Time: 11/04/15 10:02 AM  Result Value Ref Range   Hgb A1c MFr Bld 5.4 4.6 - 6.5 %    Comment: Glycemic Control Guidelines for People with Diabetes:Non Diabetic:  <6%Goal of Therapy: <7%Additional Action Suggested:  >8%   POCT urine pregnancy     Status: Normal   Collection Time: 01/09/16  9:10 AM  Result Value Ref Range   Preg Test, Ur Negative Negative   Follow-up: Return in about 4 weeks (around 02/06/2016) for anxiety.  Alysia Pennaharlotte Regis Hinton, NP

## 2016-01-15 DIAGNOSIS — Z760 Encounter for issue of repeat prescription: Secondary | ICD-10-CM | POA: Diagnosis not present

## 2016-02-08 DIAGNOSIS — Z6831 Body mass index (BMI) 31.0-31.9, adult: Secondary | ICD-10-CM | POA: Diagnosis not present

## 2016-02-08 DIAGNOSIS — Z3044 Encounter for surveillance of vaginal ring hormonal contraceptive device: Secondary | ICD-10-CM | POA: Diagnosis not present

## 2016-02-08 DIAGNOSIS — Z01411 Encounter for gynecological examination (general) (routine) with abnormal findings: Secondary | ICD-10-CM | POA: Diagnosis not present

## 2016-02-08 DIAGNOSIS — A609 Anogenital herpesviral infection, unspecified: Secondary | ICD-10-CM | POA: Diagnosis not present

## 2016-02-08 DIAGNOSIS — E669 Obesity, unspecified: Secondary | ICD-10-CM | POA: Diagnosis not present

## 2016-02-15 MED FILL — NUVARING VAGINAL RING: 0.12-0.015 | 28 days supply | Qty: 3 | Fill #0

## 2016-02-15 MED FILL — ESCITALOPRAM 10 MG TABLET: 10 | 30 days supply | Qty: 30 | Fill #0

## 2016-02-15 MED FILL — VALACYCLOVIR HCL 500 MG TAB: 500 | 90 days supply | Qty: 90 | Fill #0

## 2016-02-17 ENCOUNTER — Ambulatory Visit (INDEPENDENT_AMBULATORY_CARE_PROVIDER_SITE_OTHER): Payer: 59 | Admitting: Nurse Practitioner

## 2016-02-17 ENCOUNTER — Encounter: Payer: Self-pay | Admitting: Nurse Practitioner

## 2016-02-17 VITALS — BP 102/68 | HR 85 | Temp 98.3°F | Resp 12 | Ht 64.0 in | Wt 186.4 lb

## 2016-02-17 DIAGNOSIS — G4700 Insomnia, unspecified: Secondary | ICD-10-CM

## 2016-02-17 DIAGNOSIS — F411 Generalized anxiety disorder: Secondary | ICD-10-CM | POA: Diagnosis not present

## 2016-02-17 MED ORDER — ESCITALOPRAM OXALATE 20 MG PO TABS: 20.0000 mg | ORAL_TABLET | Freq: Every day | ORAL | 3 refills | Status: DC

## 2016-02-17 MED ORDER — TEMAZEPAM 15 MG PO CAPS: 15.0000 mg | ORAL_CAPSULE | Freq: Every evening | ORAL | 1 refills | Status: DC | PRN

## 2016-02-17 MED FILL — TEMAZEPAM 15 MG CAPSULE: 15 | 30 days supply | Qty: 30 | Fill #0

## 2016-02-17 MED FILL — ESCITALOPRAM 20 MG TABLET: 20 | 30 days supply | Qty: 30 | Fill #0

## 2016-02-17 NOTE — Progress Notes (Signed)
Pre visit review using our clinic review tool, if applicable. No additional management support is needed unless otherwise documented below in the visit note. 

## 2016-02-17 NOTE — Progress Notes (Signed)
Subjective:  Patient ID: Amy [name], female    DOB: [birth year]  Age: 30 y.o. MRN: [id number]  CC: Follow-up (following up on anxiety - started lexapro, says she does not feel much different)   HPI  Anxiety: No change with current dose of lexapro. Has upcoming appt with psychologist 02/27/15. Denies any HI/SI. No change in sexual function.  Insomnia: No longer getting good result with belsomra 20mg , has to use melatonin 10mg  in order to go to sleep. No ETOH or caffeine use. No drug use.  Birth control Nuvaring: Using as prescribed Denies any side effects.  Outpatient Medications Prior to Visit  Medication Sig Dispense Refill  . etonogestrel-ethinyl estradiol (NUVARING) 0.12-0.015 MG/24HR vaginal ring Insert vaginally and leave in place for 3 consecutive weeks, then remove for 1 week. 3 each 12  . valACYclovir (VALTREX) 500 MG tablet Take 500 mg by mouth daily as needed (outbreak).    . clonazePAM (KLONOPIN) 0.5 MG tablet Take 1 tablet (0.5 mg total) by mouth 2 (two) times daily as needed for anxiety. 14 tablet 0  . escitalopram (LEXAPRO) 10 MG tablet Take 1 tablet (10 mg total) by mouth daily. 30 tablet 3  . Suvorexant (BELSOMRA) 10 MG TABS Take 10 mg by mouth at bedtime as needed. 30 tablet 1  . Multiple Vitamins-Minerals (MULTIVITAMIN WITH MINERALS) tablet Take 1 tablet by mouth daily.     No facility-administered medications prior to visit.     ROS See HPI  Objective:  BP 102/68   Pulse 85   Temp 98.3 F (36.8 C) (Oral)   Resp 12   Ht 5\' 4"  (1.626 m)   Wt 186 lb 6.4 oz (84.6 kg)   LMP 02/10/2016   SpO2 98%   BMI 32.00 kg/m   BP Readings from Last 3 Encounters:  02/17/16 102/68  01/09/16 122/82  12/16/15 108/76    Wt Readings from Last 3 Encounters:  02/17/16 186 lb 6.4 oz (84.6 kg)  01/09/16 188 lb (85.3 kg)  12/16/15 183 lb (83 kg)    Physical Exam  Constitutional: She is oriented to person, place, and time. No distress.  Neck: Normal range of  motion. Neck supple. No thyromegaly present.  Cardiovascular: Normal rate.   Pulmonary/Chest: Effort normal and breath sounds normal.  Musculoskeletal: Normal range of motion. She exhibits no edema.  Lymphadenopathy:    She has no cervical adenopathy.  Neurological: She is oriented to person, place, and time.  Skin: Skin is warm and dry.  Vitals reviewed.   Lab Results  Component Value Date   WBC 9.2 11/04/2015   HGB 13.6 11/04/2015   HCT 40.0 11/04/2015   PLT 291.0 11/04/2015   GLUCOSE 89 11/04/2015   CHOL 180 11/04/2015   TRIG 54.0 11/04/2015   HDL 55.40 11/04/2015   LDLCALC 114 (H) 11/04/2015   ALT 12 11/04/2015   AST 14 11/04/2015   NA 139 11/04/2015   K 4.3 11/04/2015   CL 103 11/04/2015   CREATININE 0.86 11/04/2015   BUN 11 11/04/2015   CO2 29 11/04/2015   TSH 1.87 11/04/2015   HGBA1C 5.4 11/04/2015    No results found.  Assessment & Plan:   Amy [name] was seen today for follow-up.  Diagnoses and all orders for this visit:  Generalized anxiety disorder -     escitalopram (LEXAPRO) 20 MG tablet; Take 1 tablet (20 mg total) by mouth daily.  Insomnia, unspecified type -     temazepam (RESTORIL) 15 MG capsule; Take  1 capsule (15 mg total) by mouth at bedtime as needed for sleep.   I have discontinued [name]'s Suvorexant, clonazePAM, and escitalopram. I am also having her start on temazepam and escitalopram. Additionally, I am having her maintain her valACYclovir, multivitamin with minerals, and etonogestrel-ethinyl estradiol.  Meds ordered this encounter  Medications  . temazepam (RESTORIL) 15 MG capsule    Sig: Take 1 capsule (15 mg total) by mouth at bedtime as needed for sleep.    Dispense:  30 capsule    Refill:  1    Order Specific Question:   Supervising Provider    Answer:   Tresa GarterPLOTNIKOV, ALEKSEI V [[id number]]  . escitalopram (LEXAPRO) 20 MG tablet    Sig: Take 1 tablet (20 mg total) by mouth daily.    Dispense:  30 tablet    Refill:  3    Order  Specific Question:   Supervising Provider    Answer:   Tresa GarterPLOTNIKOV, ALEKSEI V [[id number]]    Follow-up: Return in about 3 months (around 05/17/2016) for anxiety and insomnia.  Alysia Pennaharlotte Nche, NP

## 2016-02-17 NOTE — Patient Instructions (Addendum)
Maintain upcoming appt with psychologist. Consider referral to sleep study if no improvement in 3months.  Generalized Anxiety Disorder Generalized anxiety disorder (GAD) is a mental disorder. It interferes with life functions, including relationships, work, and school. GAD is different from normal anxiety, which everyone experiences at some point in their lives in response to specific life events and activities. Normal anxiety actually helps us prepare for and get through these life events and activities. Normal anxiety goes away after the event or activity is over.  GAD causes anxiety that is not necessarily related to specific events or activities. It also causes excess anxiety in proportion to specific events or activities. The anxiety associated with GAD is also difficult to control. GAD can vary from mild to severe. People with severe GAD can have intense waves of anxiety with physical symptoms (panic attacks).  SYMPTOMS The anxiety and worry associated with GAD are difficult to control. This anxiety and worry are related to many life events and activities and also occur more days than not for 6 months or longer. People with GAD also have three or more of the following symptoms (one or more in children):  Restlessness.   Fatigue.  Difficulty concentrating.   Irritability.  Muscle tension.  Difficulty sleeping or unsatisfying sleep. DIAGNOSIS GAD is diagnosed through an assessment by your health care provider. Your health care provider will ask you questions aboutyour mood,physical symptoms, and events in your life. Your health care provider may ask you about your medical history and use of alcohol or drugs, including prescription medicines. Your health care provider may also do a physical exam and blood tests. Certain medical conditions and the use of certain substances can cause symptoms similar to those associated with GAD. Your health care provider may refer you to a mental health  specialist for further evaluation. TREATMENT The following therapies are usually used to treat GAD:   Medication. Antidepressant medication usually is prescribed for long-term daily control. Antianxiety medicines may be added in severe cases, especially when panic attacks occur.   Talk therapy (psychotherapy). Certain types of talk therapy can be helpful in treating GAD by providing support, education, and guidance. A form of talk therapy called cognitive behavioral therapy can teach you healthy ways to think about and react to daily life events and activities.  Stress managementtechniques. These include yoga, meditation, and exercise and can be very helpful when they are practiced regularly. A mental health specialist can help determine which treatment is best for you. Some people see improvement with one therapy. However, other people require a combination of therapies. This information is not intended to replace advice given to you by your health care provider. Make sure you discuss any questions you have with your health care provider. Document Released: 06/02/2012 Document Revised: 02/26/2014 Document Reviewed: 06/02/2012 Elsevier Interactive Patient Education  2017 [employer]

## 2016-02-22 ENCOUNTER — Ambulatory Visit (INDEPENDENT_AMBULATORY_CARE_PROVIDER_SITE_OTHER): Payer: 59 | Admitting: Licensed Clinical Social Worker

## 2016-02-22 DIAGNOSIS — F410 Panic disorder [episodic paroxysmal anxiety] without agoraphobia: Secondary | ICD-10-CM

## 2016-03-12 ENCOUNTER — Ambulatory Visit (INDEPENDENT_AMBULATORY_CARE_PROVIDER_SITE_OTHER): Payer: 59 | Admitting: Licensed Clinical Social Worker

## 2016-03-12 DIAGNOSIS — F410 Panic disorder [episodic paroxysmal anxiety] without agoraphobia: Secondary | ICD-10-CM | POA: Diagnosis not present

## 2016-03-15 MED FILL — TEMAZEPAM 15 MG CAPSULE: 15 | 30 days supply | Qty: 30 | Fill #1

## 2016-04-05 MED FILL — ESCITALOPRAM 20 MG TABLET: 20 | 30 days supply | Qty: 30 | Fill #1

## 2016-04-16 ENCOUNTER — Ambulatory Visit (INDEPENDENT_AMBULATORY_CARE_PROVIDER_SITE_OTHER): Payer: 59 | Admitting: Licensed Clinical Social Worker

## 2016-04-16 DIAGNOSIS — F410 Panic disorder [episodic paroxysmal anxiety] without agoraphobia: Secondary | ICD-10-CM

## 2016-05-07 MED FILL — ESCITALOPRAM 20 MG TABLET: 20 | 30 days supply | Qty: 30 | Fill #2

## 2016-05-14 MED FILL — VALACYCLOVIR HCL 500 MG TAB: 500 | 90 days supply | Qty: 90 | Fill #1

## 2016-05-29 ENCOUNTER — Telehealth: Payer: Self-pay | Admitting: Nurse Practitioner

## 2016-05-29 NOTE — Telephone Encounter (Signed)
Pt called wanting a refill of clonazePAM (KLONOPIN) 0.5 MG. I told her it looks like you were taken off of that medication.  Wants to know if she needs to come in for more anxiety medication. Please advise.

## 2016-05-29 NOTE — Telephone Encounter (Signed)
Please advise, last ov was 02/17/16, no future appt made yet.

## 2016-05-29 NOTE — Telephone Encounter (Signed)
Pt aware appt set

## 2016-05-29 NOTE — Telephone Encounter (Signed)
No longer on klonopin. Was chnaged to restoril Needs OV to follow up on anxiety and insomnia

## 2016-05-31 ENCOUNTER — Ambulatory Visit (INDEPENDENT_AMBULATORY_CARE_PROVIDER_SITE_OTHER): Payer: 59 | Admitting: Nurse Practitioner

## 2016-05-31 ENCOUNTER — Encounter: Payer: Self-pay | Admitting: Nurse Practitioner

## 2016-05-31 VITALS — BP 104/80 | HR 64 | Temp 98.3°F | Ht 64.0 in | Wt 201.0 lb

## 2016-05-31 DIAGNOSIS — F411 Generalized anxiety disorder: Secondary | ICD-10-CM | POA: Diagnosis not present

## 2016-05-31 DIAGNOSIS — G4700 Insomnia, unspecified: Secondary | ICD-10-CM

## 2016-05-31 DIAGNOSIS — E7800 Pure hypercholesterolemia, unspecified: Secondary | ICD-10-CM

## 2016-05-31 DIAGNOSIS — F4001 Agoraphobia with panic disorder: Secondary | ICD-10-CM

## 2016-05-31 DIAGNOSIS — Z30011 Encounter for initial prescription of contraceptive pills: Secondary | ICD-10-CM | POA: Diagnosis not present

## 2016-05-31 DIAGNOSIS — R7303 Prediabetes: Secondary | ICD-10-CM

## 2016-05-31 LAB — POCT URINE PREGNANCY: PREG TEST UR: NEGATIVE

## 2016-05-31 MED ORDER — ESCITALOPRAM OXALATE 20 MG PO TABS: 20.0000 mg | ORAL_TABLET | Freq: Every day | ORAL | 1 refills | Status: DC

## 2016-05-31 MED ORDER — NORGESTIMATE-ETH ESTRADIOL 0.25-35 MG-MCG PO TABS: 1.0000 | ORAL_TABLET | Freq: Every day | ORAL | 11 refills | Status: DC

## 2016-05-31 MED ORDER — CLONAZEPAM 0.5 MG PO TABS: 0.5000 mg | ORAL_TABLET | Freq: Two times a day (BID) | ORAL | 1 refills | Status: DC | PRN

## 2016-05-31 MED ORDER — ZOLPIDEM TARTRATE 5 MG PO TABS: 5.0000 mg | ORAL_TABLET | Freq: Every evening | ORAL | 0 refills | Status: DC | PRN

## 2016-05-31 MED FILL — clonazePAM 0.5 MG TABS: 0.5 | 3 days supply | Qty: 6 | Fill #0

## 2016-05-31 MED FILL — ZOLPIDEM TARTRATE 5 MG TAB: 5 | 30 days supply | Qty: 30 | Fill #0

## 2016-05-31 MED FILL — MONO-LINYAH 28 TABLET: 0.25-35 | 84 days supply | Qty: 84 | Fill #0

## 2016-05-31 MED FILL — ESCITALOPRAM 20 MG TABLET: 20 | 90 days supply | Qty: 90 | Fill #0

## 2016-05-31 NOTE — Patient Instructions (Addendum)
Do not use Ambien and klonopin together. Due to risk of increase sedation.  Do not use Ambien, klonopin with alcohol or any other sedating agents.  Calorie Counting for Weight Loss Calories are units of energy. Your body needs a certain amount of calories from food to keep you going throughout the day. When you eat more calories than your body needs, your body stores the extra calories as fat. When you eat fewer calories than your body needs, your body burns fat to get the energy it needs. Calorie counting means keeping track of how many calories you eat and drink each day. Calorie counting can be helpful if you need to lose weight. If you make sure to eat fewer calories than your body needs, you should lose weight. Ask your health care provider what a healthy weight is for you. For calorie counting to work, you will need to eat the right number of calories in a day in order to lose a healthy amount of weight per week. A dietitian can help you determine how many calories you need in a day and will give you suggestions on how to reach your calorie goal.  A healthy amount of weight to lose per week is usually 1-2 lb (0.5-0.9 kg). This usually means that your daily calorie intake should be reduced by 500-750 calories.  Eating 1,200 - 1,500 calories per day can help most women lose weight.  Eating 1,500 - 1,800 calories per day can help most men lose weight. What is my plan? My goal is to have __________ calories per day. If I have this many calories per day, I should lose around __________ pounds per week. What do I need to know about calorie counting? In order to meet your daily calorie goal, you will need to:  Find out how many calories are in each food you would like to eat. Try to do this before you eat.  Decide how much of the food you plan to eat.  Write down what you ate and how many calories it had. Doing this is called keeping a food log. To successfully lose weight, it is important to  balance calorie counting with a healthy lifestyle that includes regular activity. Aim for 150 minutes of moderate exercise (such as walking) or 75 minutes of vigorous exercise (such as running) each week. Where do I find calorie information?   The number of calories in a food can be found on a Nutrition Facts label. If a food does not have a Nutrition Facts label, try to look up the calories online or ask your dietitian for help. Remember that calories are listed per serving. If you choose to have more than one serving of a food, you will have to multiply the calories per serving by the amount of servings you plan to eat. For example, the label on a package of bread might say that a serving size is 1 slice and that there are 90 calories in a serving. If you eat 1 slice, you will have eaten 90 calories. If you eat 2 slices, you will have eaten 180 calories. How do I keep a food log? Immediately after each meal, record the following information in your food log:  What you ate. Don't forget to include toppings, sauces, and other extras on the food.  How much you ate. This can be measured in cups, ounces, or number of items.  How many calories each food and drink had.  The total number of calories in  the meal. Keep your food log near you, such as in a small notebook in your pocket, or use a mobile app or website. Some programs will calculate calories for you and show you how many calories you have left for the day to meet your goal. What are some calorie counting tips?  Use your calories on foods and drinks that will fill you up and not leave you hungry:  Some examples of foods that fill you up are nuts and nut butters, vegetables, lean proteins, and high-fiber foods like whole grains. High-fiber foods are foods with more than 5 g fiber per serving.  Drinks such as sodas, specialty coffee drinks, alcohol, and juices have a lot of calories, yet do not fill you up.  Eat nutritious foods and avoid  empty calories. Empty calories are calories you get from foods or beverages that do not have many vitamins or protein, such as candy, sweets, and soda. It is better to have a nutritious high-calorie food (such as an avocado) than a food with few nutrients (such as a bag of chips).  Know how many calories are in the foods you eat most often. This will help you calculate calorie counts faster.  Pay attention to calories in drinks. Low-calorie drinks include water and unsweetened drinks.  Pay attention to nutrition labels for "low fat" or "fat free" foods. These foods sometimes have the same amount of calories or more calories than the full fat versions. They also often have added sugar, starch, or salt, to make up for flavor that was removed with the fat.  Find a way of tracking calories that works for you. Get creative. Try different apps or programs if writing down calories does not work for you. What are some portion control tips?  Know how many calories are in a serving. This will help you know how many servings of a certain food you can have.  Use a measuring cup to measure serving sizes. You could also try weighing out portions on a kitchen scale. With time, you will be able to estimate serving sizes for some foods.  Take some time to put servings of different foods on your favorite plates, bowls, and cups so you know what a serving looks like.  Try not to eat straight from a bag or box. Doing this can lead to overeating. Put the amount you would like to eat in a cup or on a plate to make sure you are eating the right portion.  Use smaller plates, glasses, and bowls to prevent overeating.  Try not to multitask (for example, watch TV or use your computer) while eating. If it is time to eat, sit down at a table and enjoy your food. This will help you to know when you are full. It will also help you to be aware of what you are eating and how much you are eating. What are tips for following  this plan? Reading food labels   Check the calorie count compared to the serving size. The serving size may be smaller than what you are used to eating.  Check the source of the calories. Make sure the food you are eating is high in vitamins and protein and low in saturated and trans fats. Shopping   Read nutrition labels while you shop. This will help you make healthy decisions before you decide to purchase your food.  Make a grocery list and stick to it. Cooking   Try to cook your favorite foods in  a healthier way. For example, try baking instead of frying.  Use low-fat dairy products. Meal planning   Use more fruits and vegetables. Half of your plate should be fruits and vegetables.  Include lean proteins like poultry and fish. How do I count calories when eating out?  Ask for smaller portion sizes.  Consider sharing an entree and sides instead of getting your own entree.  If you get your own entree, eat only half. Ask for a box at the beginning of your meal and put the rest of your entree in it so you are not tempted to eat it.  If calories are listed on the menu, choose the lower calorie options.  Choose dishes that include vegetables, fruits, whole grains, low-fat dairy products, and lean protein.  Choose items that are boiled, broiled, grilled, or steamed. Stay away from items that are buttered, battered, fried, or served with cream sauce. Items labeled "crispy" are usually fried, unless stated otherwise.  Choose water, low-fat milk, unsweetened iced tea, or other drinks without added sugar. If you want an alcoholic beverage, choose a lower calorie option such as a glass of wine or light beer.  Ask for dressings, sauces, and syrups on the side. These are usually high in calories, so you should limit the amount you eat.  If you want a salad, choose a garden salad and ask for grilled meats. Avoid extra toppings like bacon, cheese, or fried items. Ask for the dressing on  the side, or ask for olive oil and vinegar or lemon to use as dressing.  Estimate how many servings of a food you are given. For example, a serving of cooked rice is  cup or about the size of half a baseball. Knowing serving sizes will help you be aware of how much food you are eating at restaurants. The list below tells you how big or small some common portion sizes are based on everyday objects:  1 oz-4 stacked dice.  3 oz-1 deck of cards.  1 tsp-1 die.  1 Tbsp- a ping-pong ball.  2 Tbsp-1 ping-pong ball.   cup- baseball.  1 cup-1 baseball. Summary  Calorie counting means keeping track of how many calories you eat and drink each day. If you eat fewer calories than your body needs, you should lose weight.  A healthy amount of weight to lose per week is usually 1-2 lb (0.5-0.9 kg). This usually means reducing your daily calorie intake by 500-750 calories.  The number of calories in a food can be found on a Nutrition Facts label. If a food does not have a Nutrition Facts label, try to look up the calories online or ask your dietitian for help.  Use your calories on foods and drinks that will fill you up, and not on foods and drinks that will leave you hungry.  Use smaller plates, glasses, and bowls to prevent overeating. This information is not intended to replace advice given to you by your health care provider. Make sure you discuss any questions you have with your health care provider. Document Released: 02/05/2005 Document Revised: 01/06/2016 Document Reviewed: 01/06/2016 Elsevier Interactive Patient Education  2017 [employer].

## 2016-05-31 NOTE — Progress Notes (Signed)
Pre visit review using our clinic review tool, if applicable. No additional management support is needed unless otherwise documented below in the visit note. 

## 2016-05-31 NOTE — Progress Notes (Signed)
Subjective:  Patient ID: Amy [name], female    DOB: 1985-09-02  Age: 31 y.o. MRN: [id number]  CC: Follow-up (follow up/ birth control option?)   HPI   Anxiety: Improved with lexapro. She states she has noticed significant improvement in work performance and mood Denies any adverse side effects. Completed pschotherapy 03/2016. She is to return to therapy sessions after upcoming trip (cruise 06/01/2016-06/11/2016).  Upcoming cruise 06/01/2016 to 06/11/2016: Requesting for klonopin prescription to manage acute panic attacks.  Insomnia: No improvement with benadryl  or belsomra or restoril. Sleep in fragmented. She gets 4hrs of sleep, wakes up for 1hr and hard to return to sleep again. She works night shift.  Contraceptive change: She will like to switch to OCP. Vaginal ring caused increased vaginal discharge and irritation. She stopped using ring 3weeks ago.  Outpatient Medications Prior to Visit  Medication Sig Dispense Refill  . valACYclovir (VALTREX) 500 MG tablet Take 500 mg by mouth daily as needed (outbreak).    Marland Kitchen escitalopram (LEXAPRO) 20 MG tablet Take 1 tablet (20 mg total) by mouth daily. 30 tablet 3  . etonogestrel-ethinyl estradiol (NUVARING) 0.12-0.015 MG/24HR vaginal ring Insert vaginally and leave in place for 3 consecutive weeks, then remove for 1 week. 3 each 12  . temazepam (RESTORIL) 15 MG capsule Take 1 capsule (15 mg total) by mouth at bedtime as needed for sleep. 30 capsule 1  . Multiple Vitamins-Minerals (MULTIVITAMIN WITH MINERALS) tablet Take 1 tablet by mouth daily.     No facility-administered medications prior to visit.     ROS See HPI  Objective:  BP 104/80   Pulse 64   Temp 98.3 F (36.8 C)   Ht  (1.626 m)   Wt 201 lb (91.2 kg)   SpO2 99%   BMI 34.50 kg/m   BP Readings from Last 3 Encounters:  05/31/16 104/80  02/17/16 102/68  01/09/16 122/82    Wt Readings from Last 3 Encounters:  05/31/16 201 lb (91.2 kg)    02/17/16 186 lb 6.4 oz (84.6 kg)  01/09/16 188 lb (85.3 kg)    Physical Exam  Constitutional: She is oriented to person, place, and time. No distress.  Cardiovascular: Normal rate, regular rhythm and normal heart sounds.   Pulmonary/Chest: Effort normal and breath sounds normal.  Musculoskeletal: She exhibits no edema.  Neurological: She is alert and oriented to person, place, and time.  Skin: Skin is warm and dry.  Vitals reviewed.   Lab Results  Component Value Date   WBC 9.2 11/04/2015   HGB 13.6 11/04/2015   HCT 40.0 11/04/2015   PLT 291.0 11/04/2015   GLUCOSE 89 11/04/2015   CHOL 180 11/04/2015   TRIG 54.0 11/04/2015   HDL 55.40 11/04/2015   LDLCALC 114 (H) 11/04/2015   ALT 12 11/04/2015   AST 14 11/04/2015   NA 139 11/04/2015   K 4.3 11/04/2015   CL 103 11/04/2015   CREATININE 0.86 11/04/2015   BUN 11 11/04/2015   CO2 29 11/04/2015   TSH 1.87 11/04/2015   HGBA1C 5.4 11/04/2015    No results found.  Assessment & Plan:   Amy [name] was seen today for follow-up.  Diagnoses and all orders for this visit:  Generalized anxiety disorder -     escitalopram (LEXAPRO) 20 MG tablet; Take 1 tablet (20 mg total) by mouth daily.  Encounter for initial prescription of contraceptive pills -     POCT urine pregnancy -     norgestimate-ethinyl estradiol (ORTHO-CYCLEN,SPRINTEC,PREVIFEM)  0.25-35 MG-MCG tablet; Take 1 tablet by mouth daily.  Insomnia, unspecified type -     zolpidem (AMBIEN) 5 MG tablet; Take 1 tablet (5 mg total) by mouth at bedtime as needed for sleep.  Fear of travel with panic attacks -     clonazePAM (KLONOPIN) 0.5 MG tablet; Take 1 tablet (0.5 mg total) by mouth 2 (two) times daily as needed for anxiety.  Prediabetes -     Hemoglobin A1c; Future  Elevated LDL cholesterol level -     Lipid panel; Future   I have discontinued [name]'s etonogestrel-ethinyl estradiol and temazepam. I am also having her start on zolpidem, clonazePAM, and  norgestimate-ethinyl estradiol. Additionally, I am having her maintain her valACYclovir, multivitamin with minerals, and escitalopram.  Meds ordered this encounter  Medications  . zolpidem (AMBIEN) 5 MG tablet    Sig: Take 1 tablet (5 mg total) by mouth at bedtime as needed for sleep.    Dispense:  30 tablet    Refill:  0    Order Specific Question:   Supervising Provider    Answer:   Tresa Garter [[id number]]  . escitalopram (LEXAPRO) 20 MG tablet    Sig: Take 1 tablet (20 mg total) by mouth daily.    Dispense:  90 tablet    Refill:  1    Order Specific Question:   Supervising Provider    Answer:   Tresa Garter [[id number]]  . clonazePAM (KLONOPIN) 0.5 MG tablet    Sig: Take 1 tablet (0.5 mg total) by mouth 2 (two) times daily as needed for anxiety.    Dispense:  6 tablet    Refill:  1    Order Specific Question:   Supervising Provider    Answer:   Tresa Garter [[id number]]  . norgestimate-ethinyl estradiol (ORTHO-CYCLEN,SPRINTEC,PREVIFEM) 0.25-35 MG-MCG tablet    Sig: Take 1 tablet by mouth daily.    Dispense:  1 Package    Refill:  11    Order Specific Question:   Supervising Provider    Answer:   Tresa Garter [[id number]]    Follow-up: Return in about 6 months (around 11/30/2016), or if symptoms worsen or fail to improve, for CPE (fasting).  Alysia Penna, NP

## 2016-06-01 ENCOUNTER — Other Ambulatory Visit (INDEPENDENT_AMBULATORY_CARE_PROVIDER_SITE_OTHER): Payer: 59

## 2016-06-01 DIAGNOSIS — E7800 Pure hypercholesterolemia, unspecified: Secondary | ICD-10-CM

## 2016-06-01 DIAGNOSIS — R7303 Prediabetes: Secondary | ICD-10-CM | POA: Diagnosis not present

## 2016-06-01 LAB — LIPID PANEL
Cholesterol: 201 mg/dL — ABNORMAL HIGH (ref 0–200)
HDL: 55.2 mg/dL (ref >39.00)
LDL CALC: 132 mg/dL — AB (ref 0–99)
NonHDL: 145.37
Total CHOL/HDL Ratio: 4
Triglycerides: 66 mg/dL (ref 0.0–149.0)
VLDL: 13.2 mg/dL (ref 0.0–40.0)

## 2016-06-01 LAB — HEMOGLOBIN A1C: HEMOGLOBIN A1C: 5.5 % (ref 4.6–6.5)

## 2016-06-11 ENCOUNTER — Ambulatory Visit (INDEPENDENT_AMBULATORY_CARE_PROVIDER_SITE_OTHER): Payer: 59 | Admitting: Internal Medicine

## 2016-06-11 ENCOUNTER — Encounter: Payer: Self-pay | Admitting: Internal Medicine

## 2016-06-11 DIAGNOSIS — M25562 Pain in left knee: Secondary | ICD-10-CM | POA: Diagnosis not present
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
## 2016-06-11 NOTE — Patient Instructions (Signed)
You have likely injured the MCL tendon (median collateral ligament).   Combined Knee Ligament Sprain A ligament is a type of fibrous tissue that connects one bone to another bone. There are four ligaments in your knee. Together, they provide stability for your knee joint. A combined knee ligament sprain is an injury in which more than one knee ligament is severely stretched or torn. This kind of injury is also called an injury to multiple structures of the knee. What are the causes? This condition may be caused by:  A direct hit (trauma) to the knee.  Overextending the knee.  Twisting the knee. What increases the risk? This condition is more likely to develop in people who:  Participate in certain sports, including:  Contact sports, such as football, rugby, and lacrosse.  Sports that take place on uneven ground, such as soccer and cross country.  Sports that involve quick changes in position, such as basketball, dancing, gymnastics, and skiing.  Have poor strength or flexibility.  Are overweight.  Have overly flexible joints (joint laxity). What are the signs or symptoms? Symptoms of this condition include:  Swelling.  Pain with movement.  Pain when the injured area is touched.  Instability.  A popping sound that happens at the time of injury.  Inability to stand or use the injured knee to support (bear) one's body weight. How is this diagnosed? This condition is usually diagnosed with a physical exam. You may also have imaging tests, such as:  An X-ray.  MRI. How is this treated? This condition may be treated with:  Ice applied to the affected area.  Medicines for pain.  Placing the knee in a brace or splint to prevent movement.  Physical therapy to help strengthen and stabilize the knee joint.  Surgery to reconstruct a torn ligament. This may be done in severe cases. Follow these instructions at home: If you have a splint or brace:   Wear the splint  or brace as told by your health care provider. Remove it only as told by your health care provider.  Loosen the splint or brace if your toes become numb and tingle, or if they turn cold and blue.  Follow instructions from your health care provider about how to care for your splint or brace.  Keep the splint or brace clean. Managing pain, stiffness, and swelling   If directed, apply ice to the injured area.  Put ice in a plastic bag.  Place a towel between your skin and the bag.  Leave the ice on for 20 minutes, 2-3 times per day.  Move your toes often to avoid stiffness and to lessen swelling.  Raise (elevate) the injured area above the level of your heart while you are sitting or lying down. Driving   Do not drive or operate heavy machinery while taking prescription pain medicine.  Ask your health care provider when it is safe to drive if you have a splint or brace on your knee. Activity   Return to your normal activities as told by your health care provider. Ask your health care provider what activities are safe for you.  Perform exercises daily as told by your health care provider or physical therapist. General instructions   Take over-the-counter and prescription medicines only as told by your health care provider.  Do not take baths, swim, or use a hot tub until your health care provider approves. Ask your health care provider if you can take showers. You may only be allowed  to take sponge baths for bathing.  Do not use the injured limb to support your body weight until your health care provider says that you can.  Keep all follow-up visits as told by your health care provider. This is important. Contact a health care provider if:  Your symptoms do not improve.  Your symptoms get worse.  You develop tingling or numbness in the area of your injury. Get help right away if:  You develop severe numbness or tingling in your leg or foot.  Your foot turns blue, white,  or gray, and it feels cold. This information is not intended to replace advice given to you by your health care provider. Make sure you discuss any questions you have with your health care provider. Document Released: 02/05/2005 Document Revised: 07/14/2015 Document Reviewed: 04/09/2014 Elsevier Interactive Patient Education  2017 [employer].

## 2016-06-11 NOTE — Progress Notes (Signed)
Pre visit review using our clinic review tool, if applicable. No additional management support is needed unless otherwise documented below in the visit note. 

## 2016-06-12 ENCOUNTER — Ambulatory Visit (INDEPENDENT_AMBULATORY_CARE_PROVIDER_SITE_OTHER): Payer: 59 | Admitting: Licensed Clinical Social Worker

## 2016-06-12 ENCOUNTER — Telehealth: Payer: Self-pay | Admitting: Nurse Practitioner

## 2016-06-12 DIAGNOSIS — F410 Panic disorder [episodic paroxysmal anxiety] without agoraphobia: Secondary | ICD-10-CM | POA: Diagnosis not present

## 2016-06-12 DIAGNOSIS — M25562 Pain in left knee: Secondary | ICD-10-CM | POA: Insufficient documentation

## 2016-06-12 NOTE — Assessment & Plan Note (Signed)
Likely MCL sprain and talked to her about RICE. 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 Can use heat, tylenol or ibuprofen for pain. If no improvement in 2-3 weeks call back for sports medicine visit.

## 2016-06-12 NOTE — Telephone Encounter (Signed)
This would be up to her. We talked about the recovery time being average 2-4 weeks but that some people go back to full work sooner. She will need to base total time out on how she is feeling. She would have to use her time off first before FMLA would start up so she can use some of that to see how she is feeling and she can try to go back sooner if her knee if not hurting.

## 2016-06-12 NOTE — Telephone Encounter (Signed)
Pt came to see Dr Okey Dupre yesterday. She received a note requesting light duty at work for 2-4 weeks. She said that her manager can not offer her light duty and she would have to be out of work. Her manager recommended using FMLA to help with the time she will need to be out. She did not know what to do. The best number to reach here is [phone].

## 2016-06-12 NOTE — Telephone Encounter (Signed)
please advise if patient needs to be out on FMLA or if she can continue on to [employer]

## 2016-06-13 NOTE — Telephone Encounter (Signed)
Patient contacted and aware                                                                                                          

## 2016-06-20 ENCOUNTER — Encounter: Payer: Self-pay | Admitting: Sports Medicine

## 2016-06-20 ENCOUNTER — Ambulatory Visit (INDEPENDENT_AMBULATORY_CARE_PROVIDER_SITE_OTHER): Payer: 59

## 2016-06-20 ENCOUNTER — Ambulatory Visit (INDEPENDENT_AMBULATORY_CARE_PROVIDER_SITE_OTHER): Payer: 59 | Admitting: Sports Medicine

## 2016-06-20 VITALS — BP 118/78 | HR 97 | Temp 98.7°F | Ht 64.0 in | Wt 202.4 lb

## 2016-06-20 DIAGNOSIS — S8992XA Unspecified injury of left lower leg, initial encounter: Secondary | ICD-10-CM | POA: Diagnosis not present

## 2016-06-20 DIAGNOSIS — M25562 Pain in left knee: Secondary | ICD-10-CM

## 2016-06-20 NOTE — Progress Notes (Signed)
OFFICE VISIT NOTE Amy [name]. [name] Amy [name] at [facility] [phone]  Amy [name] - 31 y.o. female MRN [id number]  Date of birth: 04/10/1985  Visit Date: 06/20/2016  PCP: Anne Ng, NP   Referred by: Anne Ng, NP  Clovis Cao, cma acting as scribe for Dr. Berline Chough.  SUBJECTIVE:   Chief Complaint  Patient presents with  . Follow-up    knee pain    HPI: As below and per problem based documentation when appropriate.  Valgus stress injury over the knee.  She did haveachiness and stiffness.  It is improving on a daily basis. She is tried taking over-the-counter anti-inflammatories have not significantly beneficial.  She has been icing and avoiding exacerbating activities.    ROS  Otherwise per HPI.  HISTORY & PERTINENT PRIOR DATA:  No specialty comments available. She reports that she has never smoked. She has never used smokeless tobacco.   Recent Labs  11/04/15 1002 06/01/16 0842  HGBA1C 5.4 5.5   Medications & Allergies reviewed per EMR Patient Active Problem List   Diagnosis Date Noted  . Left knee pain 06/12/2016  . Prediabetes 05/31/2016  . Insomnia 11/04/2015  . Generalized anxiety disorder 11/04/2015   Past Medical History:  Diagnosis Date  . Anxiety    with flying only  . Herpes    Family History  Problem Relation Age of Onset  . Hypertension Mother   . Hyperlipidemia Mother   . Hyperlipidemia Father   . Alcohol abuse Father   . Cirrhosis Father   . Hypertension Maternal Aunt   . Heart disease Paternal Grandmother    Past Surgical History:  Procedure Laterality Date  . CERVICAL BIOPSY  W/ LOOP ELECTRODE EXCISION     Social History   Occupational History  . Not on file.   Social History Main Topics  . Smoking status: Never Smoker  . Smokeless tobacco: Never Used  . Alcohol use Yes     Comment: social  . Drug use: No  . Sexual activity: Yes    Birth control/  protection: Condom    OBJECTIVE:  VS:  HT:5\' 4"  (162.6 cm)   WT:202 lb 6.4 oz (91.8 kg)  BMI:34.8    BP:118/78  HR:97bpm  TEMP:98.7 F (37.1 C)(Oral)  RESP:97 % Physical Exam Ortho Exam Findings:  WDWN, NAD, Non-toxic appearing Alert & appropriately interactive Not depressed or anxious appearing No increased work of breathing. Pupils are equal. EOM intact without nystagmus No clubbing or cyanosis of the extremities appreciated No significant rashes/lesions/ulcerations overlying the examined area. DP & PT pulses 2+/4.  No significant pretibial edema.  No clubbing or cyanosis Sensation intact to light touch in lower extremities.  Left Knee: Overall joint is well aligned, no significant deformity.   No significant effusion.   ROM: 0 to 120.   Extensor mechanism intact Generalized medial joint line tenderness.  No focal pes tenderness..   Pain with valgus testing but no opening.  Ligamentously stable.  Anterior posterior drawer stable.  Normal Lachman's.    No pain with McMurray's.  Uncomfortable with Thessaly due to medial pain.  No mechanical symptoms.      Dg Knee 3 View Left  Result Date: 06/20/2016 CLINICAL DATA:  Fall 2 weeks ago with twisting injury to the knee and persistent pain, initial encounter EXAM: LEFT KNEE - 3 VIEW COMPARISON:  None. FINDINGS: Mild medial joint space narrowing is noted. No joint effusion is seen. No  acute fracture or dislocation is noted. IMPRESSION: No acute abnormality noted. Electronically Signed   By: Alcide Clever M.D.   On: 06/20/2016 15:47    ASSESSMENT & PLAN:   Problem List Items Addressed This Visit    Left knee pain - Primary    Only minimal pain with valgus testing of her ACL.  We discussed immobilization, strengthening and rice treatment. 4-week follow-up for reevaluation and consideration of further advanced imaging at that time if any persistent ongoing symptoms as I suspect this is an isolated injury given how quickly she is  improving.  +++++++++++++++++++++++++++++++++++++++++++++++++++++++++++++++ PROCEDURE NOTE: THERAPEUTIC EXERCISES (97110) 15 minutes spent for Therapeutic exercises as stated in above notes.  This included exercises focusing on stretching, strengthening, with significant focus on eccentric aspects.   Proper technique shown and discussed handout in great detail with ATC.  All questions were discussed and answered.        Relevant Orders   DG KNEE 3 VIEW LEFT (Completed)      Follow-up: Return in about 4 weeks (around 07/18/2016).   CMA/ATC served as Neurosurgeon during this visit. History, Physical, and Plan performed by medical provider. Documentation and orders reviewed and attested to.      Gaspar Bidding, DO    [facility]

## 2016-06-20 NOTE — Patient Instructions (Signed)
Please perform the exercise program that Fayrene Fearing has prepared for you and gone over in detail on a daily basis.  In addition to the handout you were provided you can access your program through: [email or web address]   Your unique program code is: [email or web address]

## 2016-06-27 ENCOUNTER — Other Ambulatory Visit: Payer: Self-pay | Admitting: Nurse Practitioner

## 2016-06-27 DIAGNOSIS — G4700 Insomnia, unspecified: Secondary | ICD-10-CM

## 2016-06-27 NOTE — Telephone Encounter (Signed)
Please advise 

## 2016-06-28 NOTE — Telephone Encounter (Signed)
rx faxed

## 2016-07-02 MED FILL — ZOLPIDEM TARTRATE 5 MG TAB: 5 | 30 days supply | Qty: 30 | Fill #0

## 2016-07-16 NOTE — Assessment & Plan Note (Addendum)
Only minimal pain with valgus testing of her ACL.  We discussed immobilization, strengthening and rice treatment. 4-week follow-up for reevaluation and consideration of further advanced imaging at that time if any persistent ongoing symptoms as I suspect this is an isolated injury given how quickly she is improving.  +++++++++++++++++++++++++++++++++++++++++++++++++++++++++++++++ PROCEDURE NOTE: THERAPEUTIC EXERCISES (97110) 15 minutes spent for Therapeutic exercises as stated in above notes.  This included exercises focusing on stretching, strengthening, with significant focus on eccentric aspects.   Proper technique shown and discussed handout in great detail with ATC.  All questions were discussed and answered.

## 2016-07-18 ENCOUNTER — Ambulatory Visit (INDEPENDENT_AMBULATORY_CARE_PROVIDER_SITE_OTHER): Payer: 59 | Admitting: Sports Medicine

## 2016-07-18 ENCOUNTER — Encounter: Payer: Self-pay | Admitting: Sports Medicine

## 2016-07-18 VITALS — BP 96/72 | HR 76 | Ht 64.0 in | Wt 202.2 lb

## 2016-07-18 DIAGNOSIS — M25562 Pain in left knee: Secondary | ICD-10-CM | POA: Diagnosis not present

## 2016-07-18 NOTE — Assessment & Plan Note (Signed)
Overall she is continuing to improve well.  She is able to return to activities as tolerated at this point I will plan to see her back in 6 additional weeks.  Overall this appears to be a grade 1 MCL strain based on clinical exam and given the overall continued improvement will defer any type of further imaging at this time.  Therapeutic exercises previously reviewed and should be continued at this time.
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
## 2016-07-27 ENCOUNTER — Encounter: Payer: Self-pay | Admitting: Nurse Practitioner

## 2016-07-27 ENCOUNTER — Ambulatory Visit (INDEPENDENT_AMBULATORY_CARE_PROVIDER_SITE_OTHER): Payer: 59 | Admitting: Nurse Practitioner

## 2016-07-27 VITALS — BP 120/84 | HR 62 | Temp 98.4°F | Ht 64.0 in | Wt 201.0 lb

## 2016-07-27 DIAGNOSIS — Z6834 Body mass index (BMI) 34.0-34.9, adult: Secondary | ICD-10-CM

## 2016-07-27 DIAGNOSIS — E6609 Other obesity due to excess calories: Secondary | ICD-10-CM

## 2016-07-27 MED ORDER — PHENTERMINE HCL 37.5 MG PO TABS: 37.5000 mg | ORAL_TABLET | Freq: Every day | ORAL | 0 refills | Status: DC

## 2016-07-27 MED FILL — PHENTERMINE 37.5 MG TABLET: 37.5 | 30 days supply | Qty: 30 | Fill #0

## 2016-07-27 NOTE — Progress Notes (Signed)
Subjective:  Patient ID: Amy [name], female    DOB: 1985/09/06  Age: 31 y.o. MRN: [id number]  CC: Follow-up (weight loss consult. working out and eat right but not lossing enough weight. )   Other  This is a chronic (encounter for weight loss) problem. The current episode started more than 1 year ago. The problem occurs constantly. The problem has been waxing and waning. Pertinent negatives include no abdominal pain, anorexia, change in bowel habit, chest pain, fatigue, fever, joint swelling, myalgias, nausea, numbness, rash, sore throat, swollen glands, urinary symptoms, vertigo or weakness. Treatments tried: started exercise regimen and changes to diet in last 3weeks. The treatment provided no relief.   Weight Loss: Exercise: elliptical, weight training 4x/week Diet: low fat and decreased calories. Changes started 3weeks ago.  Outpatient Medications Prior to Visit  Medication Sig Dispense Refill  . escitalopram (LEXAPRO) 20 MG tablet Take 1 tablet (20 mg total) by mouth daily. 90 tablet 1  . norgestimate-ethinyl estradiol (ORTHO-CYCLEN,SPRINTEC,PREVIFEM) 0.25-35 MG-MCG tablet Take 1 tablet by mouth daily. 1 Package 11  . valACYclovir (VALTREX) 500 MG tablet Take 500 mg by mouth daily.     Marland Kitchen. zolpidem (AMBIEN) 5 MG tablet TAKE 1 TABLET BY MOUTH AT BEDTIME AS NEEDED FOR SLEEP 30 tablet 0  . clonazePAM (KLONOPIN) 0.5 MG tablet Take 1 tablet (0.5 mg total) by mouth 2 (two) times daily as needed for anxiety. (Patient not taking: Reported on 07/27/2016) 6 tablet 1   No facility-administered medications prior to visit.     ROS See HPI  Objective:  BP 120/84   Pulse 62   Temp 98.4 F (36.9 C)   Ht 5\' 4"  (1.626 m)   Wt 201 lb (91.2 kg)   SpO2 98%   BMI 34.50 kg/m   BP Readings from Last 3 Encounters:  07/27/16 120/84  07/18/16 96/72  06/20/16 118/78    Wt Readings from Last 3 Encounters:  07/27/16 201 lb (91.2 kg)  07/18/16 202 lb 3.2 oz (91.7 kg)  06/20/16 202 lb 6.4  oz (91.8 kg)    Physical Exam  Constitutional: She is oriented to person, place, and time. No distress.  Eyes: No scleral icterus.  Neck: Normal range of motion. Neck supple.  Cardiovascular: Normal rate, regular rhythm and normal heart sounds.   Pulmonary/Chest: Effort normal and breath sounds normal. No respiratory distress.  Abdominal: Soft. She exhibits no distension.  Musculoskeletal: Normal range of motion. She exhibits no edema or tenderness.  Lymphadenopathy:    She has no cervical adenopathy.  Neurological: She is alert and oriented to person, place, and time.  Skin: Skin is warm and dry.  Psychiatric: She has a normal mood and affect. Her behavior is normal.    Lab Results  Component Value Date   WBC 9.2 11/04/2015   HGB 13.6 11/04/2015   HCT 40.0 11/04/2015   PLT 291.0 11/04/2015   GLUCOSE 89 11/04/2015   CHOL 201 (H) 06/01/2016   TRIG 66.0 06/01/2016   HDL 55.20 06/01/2016   LDLCALC 132 (H) 06/01/2016   ALT 12 11/04/2015   AST 14 11/04/2015   NA 139 11/04/2015   K 4.3 11/04/2015   CL 103 11/04/2015   CREATININE 0.86 11/04/2015   BUN 11 11/04/2015   CO2 29 11/04/2015   TSH 1.87 11/04/2015   HGBA1C 5.5 06/01/2016    No results found.  Assessment & Plan:   Amy [name] was seen today for follow-up.  Diagnoses and all orders for this visit:  Class 1 obesity due to excess calories without serious comorbidity in adult, unspecified BMI -     phentermine (ADIPEX-P) 37.5 MG tablet; Take 1 tablet (37.5 mg total) by mouth daily before breakfast.   I have discontinued [name]'s clonazePAM. I am also having her start on phentermine. Additionally, I am having her maintain her valACYclovir, escitalopram, norgestimate-ethinyl estradiol, and zolpidem.  Meds ordered this encounter  Medications  . phentermine (ADIPEX-P) 37.5 MG tablet    Sig: Take 1 tablet (37.5 mg total) by mouth daily before breakfast.    Dispense:  30 tablet    Refill:  0    Order Specific  Question:   Supervising Provider    Answer:   Tresa Garter [[id number]]    Follow-up: Return in about 4 weeks (around 08/24/2016), or if symptoms worsen or fail to improve, for weight loss.Alysia Penna, NP

## 2016-07-27 NOTE — Patient Instructions (Signed)

## 2016-07-30 ENCOUNTER — Other Ambulatory Visit: Payer: Self-pay | Admitting: Nurse Practitioner

## 2016-07-30 DIAGNOSIS — G4700 Insomnia, unspecified: Secondary | ICD-10-CM

## 2016-07-30 MED FILL — ZOLPIDEM TARTRATE 5 MG TAB: 5 | 30 days supply | Qty: 30 | Fill #0

## 2016-07-30 NOTE — Telephone Encounter (Signed)
Routing to [place], please advise, thanks 

## 2016-07-30 NOTE — Telephone Encounter (Signed)
rx faxed per pete/LPN

## 2016-08-09 MED FILL — VALACYCLOVIR HCL 500 MG TAB: 500 | 90 days supply | Qty: 90 | Fill #2

## 2016-08-27 ENCOUNTER — Encounter: Payer: Self-pay | Admitting: Nurse Practitioner

## 2016-08-27 ENCOUNTER — Ambulatory Visit (INDEPENDENT_AMBULATORY_CARE_PROVIDER_SITE_OTHER): Payer: 59 | Admitting: Nurse Practitioner

## 2016-08-27 VITALS — BP 110/78 | HR 92 | Temp 98.0°F | Ht 64.0 in | Wt 193.0 lb

## 2016-08-27 DIAGNOSIS — A6004 Herpesviral vulvovaginitis: Secondary | ICD-10-CM | POA: Insufficient documentation

## 2016-08-27 DIAGNOSIS — E6609 Other obesity due to excess calories: Secondary | ICD-10-CM | POA: Diagnosis not present

## 2016-08-27 DIAGNOSIS — G4700 Insomnia, unspecified: Secondary | ICD-10-CM | POA: Diagnosis not present

## 2016-08-27 MED ORDER — ZOLPIDEM TARTRATE 5 MG PO TABS: 5.0000 mg | ORAL_TABLET | Freq: Every evening | ORAL | 2 refills | Status: DC | PRN

## 2016-08-27 MED ORDER — VALACYCLOVIR HCL 500 MG PO TABS: 500.0000 mg | ORAL_TABLET | Freq: Every day | ORAL | 1 refills | Status: DC

## 2016-08-27 MED ORDER — PHENTERMINE HCL 37.5 MG PO TABS: 37.5000 mg | ORAL_TABLET | Freq: Every day | ORAL | 2 refills | Status: DC

## 2016-08-27 MED FILL — ZOLPIDEM TARTRATE 5 MG TAB: 5 | 30 days supply | Qty: 30 | Fill #0

## 2016-08-27 MED FILL — PHENTERMINE 37.5 MG TABLET: 37.5 | 30 days supply | Qty: 30 | Fill #0

## 2016-08-27 NOTE — Progress Notes (Signed)
Subjective:  Patient ID: Amy [name], female    DOB: [birth year]  Age: 31 y.o. MRN: [id number]  CC: Follow-up (1 mo fu/ambian consult/vaginal spotting/)   HPI  Contraceptive consult: She noticed spotting x 5days after missing 1dose of oral contraceptive.  Anxiety: Stable with lexapro.  Insomnia: Stable with ambien. She will be changing her schedule to daytime in 2weeks.  Weight Loss: Denies any adverse effects with phentermine.   Outpatient Medications Prior to Visit  Medication Sig Dispense Refill  . escitalopram (LEXAPRO) 20 MG tablet Take 1 tablet (20 mg total) by mouth daily. 90 tablet 1  . norgestimate-ethinyl estradiol (ORTHO-CYCLEN,SPRINTEC,PREVIFEM) 0.25-35 MG-MCG tablet Take 1 tablet by mouth daily. 1 Package 11  . phentermine (ADIPEX-P) 37.5 MG tablet Take 1 tablet (37.5 mg total) by mouth daily before breakfast. 30 tablet 0  . valACYclovir (VALTREX) 500 MG tablet Take 500 mg by mouth daily.     Marland Kitchen. zolpidem (AMBIEN) 5 MG tablet TAKE 1 TABLET BY MOUTH AT BEDTIME AS NEEDED FOR SLEEP. 30 tablet 0   No facility-administered medications prior to visit.     ROS Review of Systems  Constitutional: Negative for diaphoresis and malaise/fatigue.  Respiratory: Negative for cough and shortness of breath.   Cardiovascular: Negative for chest pain, palpitations and leg swelling.  Gastrointestinal: Negative for abdominal pain, constipation, diarrhea and nausea.  Musculoskeletal: Negative for falls, joint pain and myalgias.  Skin: Negative.   Neurological: Negative for dizziness, sensory change, weakness and headaches.  Psychiatric/Behavioral: Negative for memory loss, substance abuse and suicidal ideas. The patient is nervous/anxious and has insomnia.      Objective:  BP 110/78   Pulse 92   Temp 98 F (36.7 C)   Ht 5\' 4"  (1.626 m)   Wt 193 lb (87.5 kg)   SpO2 98%   BMI 33.13 kg/m   BP Readings from Last 3 Encounters:  08/27/16 110/78  07/27/16 120/84    07/18/16 96/72    Wt Readings from Last 3 Encounters:  08/27/16 193 lb (87.5 kg)  07/27/16 201 lb (91.2 kg)  07/18/16 202 lb 3.2 oz (91.7 kg)    Physical Exam  Constitutional: She is oriented to person, place, and time. No distress.  Neck: Normal range of motion. Neck supple. No thyromegaly present.  Cardiovascular: Normal rate, regular rhythm and normal heart sounds.   Pulmonary/Chest: Effort normal and breath sounds normal.  Musculoskeletal: She exhibits no edema.  Neurological: She is alert and oriented to person, place, and time.  Skin: Skin is warm and dry.  Vitals reviewed.   Lab Results  Component Value Date   WBC 9.2 11/04/2015   HGB 13.6 11/04/2015   HCT 40.0 11/04/2015   PLT 291.0 11/04/2015   GLUCOSE 89 11/04/2015   CHOL 201 (H) 06/01/2016   TRIG 66.0 06/01/2016   HDL 55.20 06/01/2016   LDLCALC 132 (H) 06/01/2016   ALT 12 11/04/2015   AST 14 11/04/2015   NA 139 11/04/2015   K 4.3 11/04/2015   CL 103 11/04/2015   CREATININE 0.86 11/04/2015   BUN 11 11/04/2015   CO2 29 11/04/2015   TSH 1.87 11/04/2015   HGBA1C 5.5 06/01/2016    No results found.  Assessment & Plan:   Amy [name] was seen today for follow-up.  Diagnoses and all orders for this visit:  Herpes simplex vulvovaginitis -     valACYclovir (VALTREX) 500 MG tablet; Take 1 tablet (500 mg total) by mouth daily.  Class 1 obesity due to  excess calories without serious comorbidity in adult, unspecified BMI -     phentermine (ADIPEX-P) 37.5 MG tablet; Take 1 tablet (37.5 mg total) by mouth daily before breakfast.  Insomnia, unspecified type -     zolpidem (AMBIEN) 5 MG tablet; Take 1 tablet (5 mg total) by mouth at bedtime as needed. for sleep   I have changed Ms. Portillo's zolpidem and valACYclovir. I am also having her maintain her escitalopram, norgestimate-ethinyl estradiol, clonazePAM, and phentermine.  Meds ordered this encounter  Medications  . clonazePAM (KLONOPIN) 0.5 MG tablet     Refill:  1  . phentermine (ADIPEX-P) 37.5 MG tablet    Sig: Take 1 tablet (37.5 mg total) by mouth daily before breakfast.    Dispense:  30 tablet    Refill:  2    Order Specific Question:   Supervising Provider    Answer:   Tresa Garter [[id number]]  . zolpidem (AMBIEN) 5 MG tablet    Sig: Take 1 tablet (5 mg total) by mouth at bedtime as needed. for sleep    Dispense:  30 tablet    Refill:  2    Order Specific Question:   Supervising Provider    Answer:   Tresa Garter [[id number]]  . valACYclovir (VALTREX) 500 MG tablet    Sig: Take 1 tablet (500 mg total) by mouth daily.    Dispense:  90 tablet    Refill:  1    Order Specific Question:   Supervising Provider    Answer:   Tresa Garter [[id number]]    Follow-up: Return in about 3 months (around 11/27/2016) for anxiety, insomnia.  Alysia Penna, NP

## 2016-08-27 NOTE — Patient Instructions (Signed)
Exercising to Lose Weight Exercising can help you to lose weight. In order to lose weight through exercise, you need to do vigorous-intensity exercise. You can tell that you are exercising with vigorous intensity if you are breathing very hard and fast and cannot hold a conversation while exercising. Moderate-intensity exercise helps to maintain your current weight. You can tell that you are exercising at a moderate level if you have a higher heart rate and faster breathing, but you are still able to hold a conversation. How often should I exercise? Choose an activity that you enjoy and set realistic goals. Your health care provider can help you to make an activity plan that works for you. Exercise regularly as directed by your health care provider. This may include:  Doing resistance training twice each week, such as: ? Push-ups. ? Sit-ups. ? Lifting weights. ? Using resistance bands.  Doing a given intensity of exercise for a given amount of time. Choose from these options: ? 150 minutes of moderate-intensity exercise every week. ? 75 minutes of vigorous-intensity exercise every week. ? A mix of moderate-intensity and vigorous-intensity exercise every week.  Children, pregnant women, people who are out of shape, people who are overweight, and older adults may need to consult a health care provider for individual recommendations. If you have any sort of medical condition, be sure to consult your health care provider before starting a new exercise program. What are some activities that can help me to lose weight?  Walking at a rate of at least 4.5 miles an hour.  Jogging or running at a rate of 5 miles per hour.  Biking at a rate of at least 10 miles per hour.  Lap swimming.  Roller-skating or in-line skating.  Cross-country skiing.  Vigorous competitive sports, such as football, basketball, and soccer.  Jumping rope.  Aerobic dancing. How can I be more active in my day-to-day  activities?  Use the stairs instead of the elevator.  Take a walk during your lunch break.  If you drive, park your car farther away from work or school.  If you take public transportation, get off one stop early and walk the rest of the way.  Make all of your phone calls while standing up and walking around.  Get up, stretch, and walk around every 30 minutes throughout the day. What guidelines should I follow while exercising?  Do not exercise so much that you hurt yourself, feel dizzy, or get very short of breath.  Consult your health care provider prior to starting a new exercise program.  Wear comfortable clothes and shoes with good support.  Drink plenty of water while you exercise to prevent dehydration or heat stroke. Body water is lost during exercise and must be replaced.  Work out until you breathe faster and your heart beats faster. This information is not intended to replace advice given to you by your health care provider. Make sure you discuss any questions you have with your health care provider. Document Released: 03/10/2010 Document Revised: 07/14/2015 Document Reviewed: 07/09/2013 Elsevier Interactive Patient Education  2018 [employer]  

## 2016-08-31 ENCOUNTER — Ambulatory Visit: Payer: Self-pay | Admitting: Sports Medicine

## 2016-08-31 MED FILL — NORG-ETHIN ESTRA 0.25-0.035: 0.25-35 | 84 days supply | Qty: 84 | Fill #1

## 2016-09-05 MED FILL — ESCITALOPRAM 20 MG TABLET: 20 | 90 days supply | Qty: 90 | Fill #1

## 2016-10-04 MED FILL — PHENTERMINE 37.5 MG TABLET: 37.5 | 30 days supply | Qty: 30 | Fill #1

## 2016-10-08 MED FILL — ZOLPIDEM TARTRATE 5 MG TAB: 5 | 30 days supply | Qty: 30 | Fill #1

## 2016-11-01 ENCOUNTER — Telehealth: Payer: Self-pay | Admitting: Nurse Practitioner

## 2016-11-01 NOTE — Telephone Encounter (Signed)
Patient wants to stop phentermine.  Is requesting call back in regard.  Patient can be reached at [phone].

## 2016-11-01 NOTE — Telephone Encounter (Signed)
Spoke with pt, pt report feeling dizzy when she forgot to take this med one day.   Amy [name] advise, if pt wants to stop she can--what she experiencing was a withdraw symptoms.   Pt verbalized understand.

## 2016-11-01 NOTE — Telephone Encounter (Signed)
Left vm for the pt to call back. Need more information about medication that she wants to stop.

## 2016-11-08 MED FILL — ZOLPIDEM TARTRATE 5 MG TABL: 5 | 30 days supply | Qty: 30 | Fill #2

## 2016-11-08 MED FILL — VALACYCLOVIR HCL 500 MG TAB: 500 | 90 days supply | Qty: 90 | Fill #3

## 2016-11-19 ENCOUNTER — Ambulatory Visit (INDEPENDENT_AMBULATORY_CARE_PROVIDER_SITE_OTHER): Payer: 59 | Admitting: Physician Assistant

## 2016-11-19 ENCOUNTER — Encounter: Payer: Self-pay | Admitting: Physician Assistant

## 2016-11-19 VITALS — BP 120/82 | HR 92 | Temp 98.5°F | Ht 64.0 in | Wt 206.4 lb

## 2016-11-19 DIAGNOSIS — J069 Acute upper respiratory infection, unspecified: Secondary | ICD-10-CM | POA: Diagnosis not present

## 2016-11-19 MED ORDER — AMOXICILLIN-POT CLAVULANATE 875-125 MG PO TABS: 1.0000 | ORAL_TABLET | Freq: Two times a day (BID) | ORAL | 0 refills | Status: DC

## 2016-11-19 MED ORDER — PREDNISONE 20 MG PO TABS: 40.0000 mg | ORAL_TABLET | Freq: Every day | ORAL | 0 refills | Status: DC

## 2016-11-19 MED FILL — NORG-ETHIN ESTRA 0.25-0.035: 0.25-35 | 84 days supply | Qty: 84 | Fill #2

## 2016-11-19 MED FILL — AMOX TR-K CLV 875-125 MG TA: 875-125 | 10 days supply | Qty: 20 | Fill #0

## 2016-11-19 MED FILL — predniSONE 20 MG TABS: 20 | 5 days supply | Qty: 10 | Fill #0

## 2016-11-19 NOTE — Progress Notes (Addendum)
Amy [name] is a 31 y.o. female here for a new problem.  I acted as a Neurosurgeon for [employer], PA-C Amy Mull, LPN  History of Present Illness:   Chief Complaint  Patient presents with  . Cough    x 2 weeks    Cough  This is a new problem. Episode onset: x 2 weeks. The problem has been gradually worsening. The problem occurs every few hours. The cough is productive of sputum (Tan / Yellow in color). Associated symptoms include headaches, nasal congestion, postnasal drip and a sore throat. Pertinent negatives include no chills, ear congestion, ear pain, fever, heartburn, shortness of breath or wheezing. Associated symptoms comments: Hoarse voice. The symptoms are aggravated by lying down and cold air. Risk factors for lung disease include occupational exposure. Treatments tried: Nyquil. The treatment provided no relief.   She is a Engineer, civil (consulting) for endoscopy. No sick contacts that she is aware of. Was having some head pressure at first, took generic sudafed without relief. No hx of PNA or asthma. Appetite is good. Trying to push more fluids. No recent travel.   Past Medical History:  Diagnosis Date  . Anxiety    with flying only  . Herpes      Social History   Social History  . Marital status: Married    Spouse name: N/A  . Number of children: N/A  . Years of education: N/A   Occupational History  . Not on file.   Social History Main Topics  . Smoking status: Never Smoker  . Smokeless tobacco: Never Used  . Alcohol use Yes     Comment: social  . Drug use: No  . Sexual activity: Yes    Birth control/ protection: Condom   Other Topics Concern  . Not on file   Social History Narrative  . No narrative on file    Past Surgical History:  Procedure Laterality Date  . CERVICAL BIOPSY  W/ LOOP ELECTRODE EXCISION      Family History  Problem Relation Age of Onset  . Hypertension Mother   . Hyperlipidemia Mother   . Hyperlipidemia Father   . Alcohol abuse  Father   . Cirrhosis Father   . Hypertension Maternal Aunt   . Heart disease Paternal Grandmother     No Known Allergies  Current Medications:   Current Outpatient Prescriptions:  .  escitalopram (LEXAPRO) 20 MG tablet, Take 1 tablet (20 mg total) by mouth daily., Disp: 90 tablet, Rfl: 1 .  norgestimate-ethinyl estradiol (ORTHO-CYCLEN,SPRINTEC,PREVIFEM) 0.25-35 MG-MCG tablet, Take 1 tablet by mouth daily., Disp: 1 Package, Rfl: 11 .  valACYclovir (VALTREX) 500 MG tablet, Take 1 tablet (500 mg total) by mouth daily., Disp: 90 tablet, Rfl: 1 .  zolpidem (AMBIEN) 5 MG tablet, Take 1 tablet (5 mg total) by mouth at bedtime as needed. for sleep, Disp: 30 tablet, Rfl: 2 .  amoxicillin-clavulanate (AUGMENTIN) 875-125 MG tablet, Take 1 tablet by mouth 2 (two) times daily., Disp: 20 tablet, Rfl: 0 .  phentermine (ADIPEX-P) 37.5 MG tablet, Take 1 tablet (37.5 mg total) by mouth daily before breakfast. (Patient not taking: Reported on 11/19/2016), Disp: 30 tablet, Rfl: 2 .  predniSONE (DELTASONE) 20 MG tablet, Take 2 tablets (40 mg total) by mouth daily., Disp: 10 tablet, Rfl: 0   Review of Systems:   Review of Systems  Constitutional: Negative for chills and fever.  HENT: Positive for postnasal drip and sore throat. Negative for ear pain.   Respiratory: Positive for  cough. Negative for shortness of breath and wheezing.   Gastrointestinal: Negative for heartburn.  Neurological: Positive for headaches.    Vitals:   Vitals:   11/19/16 1605  BP: 120/82  Pulse: 92  Temp: 98.5 F (36.9 C)  TempSrc: Oral  SpO2: 96%  Weight: 206 lb 6.1 oz (93.6 kg)  Height:  (1.626 m)     Body mass index is 35.43 kg/m.  Physical Exam:   Physical Exam  Constitutional: She appears well-developed. She is cooperative.  Non-toxic appearance. She does not have a sickly appearance. She does not appear ill. No distress.  Voice is hoarse   HENT:  Head: Normocephalic and atraumatic.  Right Ear: Tympanic  membrane, external ear and ear canal normal. Tympanic membrane is not erythematous, not retracted and not bulging.  Left Ear: Tympanic membrane, external ear and ear canal normal. Tympanic membrane is not erythematous, not retracted and not bulging.  Nose: Mucosal edema and rhinorrhea present. Right sinus exhibits frontal sinus tenderness. Right sinus exhibits no maxillary sinus tenderness. Left sinus exhibits frontal sinus tenderness. Left sinus exhibits no maxillary sinus tenderness.  Mouth/Throat: Uvula is midline and mucous membranes are normal. Posterior oropharyngeal erythema present. No posterior oropharyngeal edema. Tonsils are 1+ on the right. Tonsils are 1+ on the left. No tonsillar exudate.  Eyes: Conjunctivae and lids are normal.  Neck: Trachea normal.  Cardiovascular: Normal rate, regular rhythm, S1 normal, S2 normal, normal heart sounds and normal pulses.   No LE edema  Pulmonary/Chest: Effort normal and breath sounds normal. She has no decreased breath sounds. She has no wheezes. She has no rhonchi. She has no rales.  Lymphadenopathy:    She has no cervical adenopathy.  Neurological: She is alert. GCS eye subscore is 4. GCS verbal subscore is 5. GCS motor subscore is 6.  Skin: Skin is warm, dry and intact.  Psychiatric: She has a normal mood and affect. Her speech is normal and behavior is normal.  Nursing note and vitals reviewed.   Assessment and Plan:    Amy [name] was seen today for cough.  Diagnoses and all orders for this visit:  Upper respiratory tract infection, unspecified type Patient with upper respiratory infection signs and symptoms, symptoms have been ongoing and worsening for 10 days. She now has a hoarse voice. She has failed over-the-counter treatment. We'll start Augmentin and short course of prednisone per orders. Advised that she follow-up if symptoms worsen, she develops fever or shortness breath. Patient is agreeable to plan.  Other orders -      predniSONE (DELTASONE) 20 MG tablet; Take 2 tablets (40 mg total) by mouth daily. -     amoxicillin-clavulanate (AUGMENTIN) 875-125 MG tablet; Take 1 tablet by mouth 2 (two) times daily.    . Reviewed expectations re: course of current medical issues. . Discussed self-management of symptoms. . Outlined signs and symptoms indicating need for more acute intervention. . Patient verbalized understanding and all questions were answered. . See orders for this visit as documented in the electronic medical record. . Patient received an After-Visit Summary.  CMA or LPN served as scribe during this visit. History, Physical, and Plan performed by medical provider. Documentation and orders reviewed and attested to.  Jarold Motto, PA-C

## 2016-11-19 NOTE — Patient Instructions (Signed)
It was great to meet you!  Start the antibiotic and prednisone. Take with food.   If symptoms worsen or persist, let us know.

## 2016-11-26 ENCOUNTER — Ambulatory Visit: Payer: Self-pay | Admitting: Nurse Practitioner

## 2016-11-28 ENCOUNTER — Ambulatory Visit (INDEPENDENT_AMBULATORY_CARE_PROVIDER_SITE_OTHER): Payer: 59 | Admitting: Nurse Practitioner

## 2016-11-28 ENCOUNTER — Encounter: Payer: Self-pay | Admitting: Nurse Practitioner

## 2016-11-28 VITALS — BP 118/78 | HR 86 | Temp 97.8°F | Ht 64.0 in | Wt 206.0 lb

## 2016-11-28 DIAGNOSIS — F411 Generalized anxiety disorder: Secondary | ICD-10-CM | POA: Diagnosis not present

## 2016-11-28 DIAGNOSIS — G4700 Insomnia, unspecified: Secondary | ICD-10-CM | POA: Diagnosis not present

## 2016-11-28 DIAGNOSIS — F40243 Fear of flying: Secondary | ICD-10-CM | POA: Diagnosis not present

## 2016-11-28 MED ORDER — ESCITALOPRAM OXALATE 20 MG PO TABS: 20.0000 mg | ORAL_TABLET | Freq: Every day | ORAL | 1 refills | Status: DC

## 2016-11-28 MED ORDER — ZOLPIDEM TARTRATE 5 MG PO TABS: 5.0000 mg | ORAL_TABLET | Freq: Every evening | ORAL | 2 refills | Status: DC | PRN

## 2016-11-28 MED ORDER — DIAZEPAM 2 MG PO TABS: 1.0000 mg | ORAL_TABLET | Freq: Two times a day (BID) | ORAL | 0 refills | Status: DC | PRN

## 2016-11-28 MED FILL — ESCITALOPRAM 20 MG TABLET: 20 | 90 days supply | Qty: 90 | Fill #0

## 2016-11-28 NOTE — Patient Instructions (Signed)
Due to high risk of sedation and respiratory depression; do not take valium and ambien within 4hrs of each other.  Use valtrex BID x 5days then resume once a day regimen.  Calorie Counting for Weight Loss Calories are units of energy. Your body needs a certain amount of calories from food to keep you going throughout the day. When you eat more calories than your body needs, your body stores the extra calories as fat. When you eat fewer calories than your body needs, your body burns fat to get the energy it needs. Calorie counting means keeping track of how many calories you eat and drink each day. Calorie counting can be helpful if you need to lose weight. If you make sure to eat fewer calories than your body needs, you should lose weight. Ask your health care provider what a healthy weight is for you. For calorie counting to work, you will need to eat the right number of calories in a day in order to lose a healthy amount of weight per week. A dietitian can help you determine how many calories you need in a day and will give you suggestions on how to reach your calorie goal.  A healthy amount of weight to lose per week is usually 1-2 lb (0.5-0.9 kg). This usually means that your daily calorie intake should be reduced by 500-750 calories.  Eating 1,200 - 1,500 calories per day can help most women lose weight.  Eating 1,500 - 1,800 calories per day can help most men lose weight.  What is my plan? My goal is to have __1500________ calories per day. If I have this many calories per day, I should lose around _____1_____ pounds per week. What do I need to know about calorie counting? In order to meet your daily calorie goal, you will need to:  Find out how many calories are in each food you would like to eat. Try to do this before you eat.  Decide how much of the food you plan to eat.  Write down what you ate and how many calories it had. Doing this is called keeping a food log.  To  successfully lose weight, it is important to balance calorie counting with a healthy lifestyle that includes regular activity. Aim for 150 minutes of moderate exercise (such as walking) or 75 minutes of vigorous exercise (such as running) each week. Where do I find calorie information?  The number of calories in a food can be found on a Nutrition Facts label. If a food does not have a Nutrition Facts label, try to look up the calories online or ask your dietitian for help. Remember that calories are listed per serving. If you choose to have more than one serving of a food, you will have to multiply the calories per serving by the amount of servings you plan to eat. For example, the label on a package of bread might say that a serving size is 1 slice and that there are 90 calories in a serving. If you eat 1 slice, you will have eaten 90 calories. If you eat 2 slices, you will have eaten 180 calories. How do I keep a food log? Immediately after each meal, record the following information in your food log:  What you ate. Don't forget to include toppings, sauces, and other extras on the food.  How much you ate. This can be measured in cups, ounces, or number of items.  How many calories each food and drink had.  The total number of calories in the meal.  Keep your food log near you, such as in a small notebook in your pocket, or use a mobile app or website. Some programs will calculate calories for you and show you how many calories you have left for the day to meet your goal. What are some calorie counting tips?  Use your calories on foods and drinks that will fill you up and not leave you hungry: ? Some examples of foods that fill you up are nuts and nut butters, vegetables, lean proteins, and high-fiber foods like whole grains. High-fiber foods are foods with more than 5 g fiber per serving. ? Drinks such as sodas, specialty coffee drinks, alcohol, and juices have a lot of calories, yet do not  fill you up.  Eat nutritious foods and avoid empty calories. Empty calories are calories you get from foods or beverages that do not have many vitamins or protein, such as candy, sweets, and soda. It is better to have a nutritious high-calorie food (such as an avocado) than a food with few nutrients (such as a bag of chips).  Know how many calories are in the foods you eat most often. This will help you calculate calorie counts faster.  Pay attention to calories in drinks. Low-calorie drinks include water and unsweetened drinks.  Pay attention to nutrition labels for "low fat" or "fat free" foods. These foods sometimes have the same amount of calories or more calories than the full fat versions. They also often have added sugar, starch, or salt, to make up for flavor that was removed with the fat.  Find a way of tracking calories that works for you. Get creative. Try different apps or programs if writing down calories does not work for you. What are some portion control tips?  Know how many calories are in a serving. This will help you know how many servings of a certain food you can have.  Use a measuring cup to measure serving sizes. You could also try weighing out portions on a kitchen scale. With time, you will be able to estimate serving sizes for some foods.  Take some time to put servings of different foods on your favorite plates, bowls, and cups so you know what a serving looks like.  Try not to eat straight from a bag or box. Doing this can lead to overeating. Put the amount you would like to eat in a cup or on a plate to make sure you are eating the right portion.  Use smaller plates, glasses, and bowls to prevent overeating.  Try not to multitask (for example, watch TV or use your computer) while eating. If it is time to eat, sit down at a table and enjoy your food. This will help you to know when you are full. It will also help you to be aware of what you are eating and how much  you are eating. What are tips for following this plan? Reading food labels  Check the calorie count compared to the serving size. The serving size may be smaller than what you are used to eating.  Check the source of the calories. Make sure the food you are eating is high in vitamins and protein and low in saturated and trans fats. Shopping  Read nutrition labels while you shop. This will help you make healthy decisions before you decide to purchase your food.  Make a grocery list and stick to it. Cooking  Try to [employer]  your favorite foods in a healthier way. For example, try baking instead of frying.  Use low-fat dairy products. Meal planning  Use more fruits and vegetables. Half of your plate should be fruits and vegetables.  Include lean proteins like poultry and fish. How do I count calories when eating out?  Ask for smaller portion sizes.  Consider sharing an entree and sides instead of getting your own entree.  If you get your own entree, eat only half. Ask for a box at the beginning of your meal and put the rest of your entree in it so you are not tempted to eat it.  If calories are listed on the menu, choose the lower calorie options.  Choose dishes that include vegetables, fruits, whole grains, low-fat dairy products, and lean protein.  Choose items that are boiled, broiled, grilled, or steamed. Stay away from items that are buttered, battered, fried, or served with cream sauce. Items labeled "crispy" are usually fried, unless stated otherwise.  Choose water, low-fat milk, unsweetened iced tea, or other drinks without added sugar. If you want an alcoholic beverage, choose a lower calorie option such as a glass of wine or light beer.  Ask for dressings, sauces, and syrups on the side. These are usually high in calories, so you should limit the amount you eat.  If you want a salad, choose a garden salad and ask for grilled meats. Avoid extra toppings like bacon, cheese,  or fried items. Ask for the dressing on the side, or ask for olive oil and vinegar or lemon to use as dressing.  Estimate how many servings of a food you are given. For example, a serving of cooked rice is  cup or about the size of half a baseball. Knowing serving sizes will help you be aware of how much food you are eating at restaurants. The list below tells you how big or small some common portion sizes are based on everyday objects: ? 1 oz-4 stacked dice. ? 3 oz-1 deck of cards. ? 1 tsp-1 die. ? 1 Tbsp- a ping-pong ball. ? 2 Tbsp-1 ping-pong ball. ?  cup- baseball. ? 1 cup-1 baseball. Summary  Calorie counting means keeping track of how many calories you eat and drink each day. If you eat fewer calories than your body needs, you should lose weight.  A healthy amount of weight to lose per week is usually 1-2 lb (0.5-0.9 kg). This usually means reducing your daily calorie intake by 500-750 calories.  The number of calories in a food can be found on a Nutrition Facts label. If a food does not have a Nutrition Facts label, try to look up the calories online or ask your dietitian for help.  Use your calories on foods and drinks that will fill you up, and not on foods and drinks that will leave you hungry.  Use smaller plates, glasses, and bowls to prevent overeating. This information is not intended to replace advice given to you by your health care provider. Make sure you discuss any questions you have with your health care provider. Document Released: 02/05/2005 Document Revised: 01/06/2016 Document Reviewed: 01/06/2016 Elsevier Interactive Patient Education  2017 [employer].   Exercising to [employer] Exercising can help you to lose weight. In order to lose weight through exercise, you need to do vigorous-intensity exercise. You can tell that you are exercising with vigorous intensity if you are breathing very hard and fast and cannot hold a conversation while  exercising. Moderate-intensity exercise  helps to maintain your current weight. You can tell that you are exercising at a moderate level if you have a higher heart rate and faster breathing, but you are still able to hold a conversation. How often should I exercise? Choose an activity that you enjoy and set realistic goals. Your health care provider can help you to make an activity plan that works for you. Exercise regularly as directed by your health care provider. This may include:  Doing resistance training twice each week, such as: ? Push-ups. ? Sit-ups. ? Lifting weights. ? Using resistance bands.  Doing a given intensity of exercise for a given amount of time. Choose from these options: ? 150 minutes of moderate-intensity exercise every week. ? 75 minutes of vigorous-intensity exercise every week. ? A mix of moderate-intensity and vigorous-intensity exercise every week.  Children, pregnant women, people who are out of shape, people who are overweight, and older adults may need to consult a health care provider for individual recommendations. If you have any sort of medical condition, be sure to consult your health care provider before starting a new exercise program. What are some activities that can help me to lose weight?  Walking at a rate of at least 4.5 miles an hour.  Jogging or running at a rate of 5 miles per hour.  Biking at a rate of at least 10 miles per hour.  Lap swimming.  Roller-skating or in-line skating.  Cross-country skiing.  Vigorous competitive sports, such as football, basketball, and soccer.  Jumping rope.  Aerobic dancing. How can I be more active in my day-to-day activities?  Use the stairs instead of the elevator.  Take a walk during your lunch break.  If you drive, park your car farther away from work or school.  If you take public transportation, get off one stop early and walk the rest of the way.  Make all of your phone calls while  standing up and walking around.  Get up, stretch, and walk around every 30 minutes throughout the day. What guidelines should I follow while exercising?  Do not exercise so much that you hurt yourself, feel dizzy, or get very short of breath.  Consult your health care provider prior to starting a new exercise program.  Wear comfortable clothes and shoes with good support.  Drink plenty of water while you exercise to prevent dehydration or heat stroke. Body water is lost during exercise and must be replaced.  Work out until you breathe faster and your heart beats faster. This information is not intended to replace advice given to you by your health care provider. Make sure you discuss any questions you have with your health care provider. Document Released: 03/10/2010 Document Revised: 07/14/2015 Document Reviewed: 07/09/2013 Elsevier Interactive Patient Education  [employer].

## 2016-11-28 NOTE — Progress Notes (Signed)
Subjective:  Patient ID: Amy [name], female    DOB: 1985-12-26  Age: 31 y.o. MRN: [id number]  CC: Follow-up (3 mo fu/ ---stop phentermine--felt like its not working) and Vaginal Itching (itching,burning,had diarrhea when she put on abx last week. )   HPI  Weight management: Unable to tolerate phentermine. Has not made changes to diet, nor exercise regimen. Does not want to use another medication at this time.  Anxiety: Stable with lexapro. Has upcoming trip to [facility] (next week). Will like valium RX. Ativan and klonopin wre ineffective in past.  Insomnia: Improved with ambien. Denies any adverse effects.  Outpatient Medications Prior to Visit  Medication Sig Dispense Refill  . norgestimate-ethinyl estradiol (ORTHO-CYCLEN,SPRINTEC,PREVIFEM) 0.25-35 MG-MCG tablet Take 1 tablet by mouth daily. 1 Package 11  . valACYclovir (VALTREX) 500 MG tablet Take 1 tablet (500 mg total) by mouth daily. 90 tablet 1  . amoxicillin-clavulanate (AUGMENTIN) 875-125 MG tablet Take 1 tablet by mouth 2 (two) times daily. 20 tablet 0  . escitalopram (LEXAPRO) 20 MG tablet Take 1 tablet (20 mg total) by mouth daily. 90 tablet 1  . zolpidem (AMBIEN) 5 MG tablet Take 1 tablet (5 mg total) by mouth at bedtime as needed. for sleep 30 tablet 2  . phentermine (ADIPEX-P) 37.5 MG tablet Take 1 tablet (37.5 mg total) by mouth daily before breakfast. (Patient not taking: Reported on 11/19/2016) 30 tablet 2  . predniSONE (DELTASONE) 20 MG tablet Take 2 tablets (40 mg total) by mouth daily. (Patient not taking: Reported on 11/28/2016) 10 tablet 0   No facility-administered medications prior to visit.     ROS See HPI  Objective:  BP 118/78   Pulse 86   Temp 97.8 F (36.6 C)   Ht  (1.626 m)   Wt 206 lb (93.4 kg)   LMP 11/14/2016   SpO2 98%   BMI 35.36 kg/m   BP Readings from Last 3 Encounters:  11/28/16 118/78  11/19/16 120/82  08/27/16 110/78    Wt Readings from Last 3 Encounters:    11/28/16 206 lb (93.4 kg)  11/19/16 206 lb 6.1 oz (93.6 kg)  08/27/16 193 lb (87.5 kg)    Physical Exam  Constitutional: She is oriented to person, place, and time. No distress.  Pulmonary/Chest: Effort normal.  Musculoskeletal: She exhibits no edema.  Neurological: She is alert and oriented to person, place, and time.  Skin: Skin is warm and dry.  Vitals reviewed.   Lab Results  Component Value Date   WBC 9.2 11/04/2015   HGB 13.6 11/04/2015   HCT 40.0 11/04/2015   PLT 291.0 11/04/2015   GLUCOSE 89 11/04/2015   CHOL 201 (H) 06/01/2016   TRIG 66.0 06/01/2016   HDL 55.20 06/01/2016   LDLCALC 132 (H) 06/01/2016   ALT 12 11/04/2015   AST 14 11/04/2015   NA 139 11/04/2015   K 4.3 11/04/2015   CL 103 11/04/2015   CREATININE 0.86 11/04/2015   BUN 11 11/04/2015   CO2 29 11/04/2015   TSH 1.87 11/04/2015   HGBA1C 5.5 06/01/2016    No results found.  Assessment & Plan:   [name] was seen today for follow-up and vaginal itching.  Diagnoses and all orders for this visit:  Fear of flying -     diazepam (VALIUM) 2 MG tablet; Take 0.5-1 tablets (1-2 mg total) by mouth every 12 (twelve) hours as needed for anxiety.  Generalized anxiety disorder -     escitalopram (LEXAPRO) 20 MG  tablet; Take 1 tablet (20 mg total) by mouth daily.  Insomnia, unspecified type -     zolpidem (AMBIEN) 5 MG tablet; Take 1 tablet (5 mg total) by mouth at bedtime as needed. for sleep   I have discontinued Amy [name]'s phentermine, predniSONE, and amoxicillin-clavulanate. I am also having her start on diazepam. Additionally, I am having her maintain her norgestimate-ethinyl estradiol, valACYclovir, escitalopram, and zolpidem.  Meds ordered this encounter  Medications  . diazepam (VALIUM) 2 MG tablet    Sig: Take 0.5-1 tablets (1-2 mg total) by mouth every 12 (twelve) hours as needed for anxiety.    Dispense:  6 tablet    Refill:  0    Order Specific Question:   Supervising Provider     Answer:   Tresa Garter [[id number]]  . escitalopram (LEXAPRO) 20 MG tablet    Sig: Take 1 tablet (20 mg total) by mouth daily.    Dispense:  90 tablet    Refill:  1    Order Specific Question:   Supervising Provider    Answer:   Tresa Garter [[id number]]  . zolpidem (AMBIEN) 5 MG tablet    Sig: Take 1 tablet (5 mg total) by mouth at bedtime as needed. for sleep    Dispense:  30 tablet    Refill:  2    Order Specific Question:   Supervising Provider    Answer:   Tresa Garter [[id number]]    Follow-up: Return in about 3 months (around 02/28/2017) for CPE (fasting) and weight loss ([place] location).  Alysia Penna, NP

## 2016-11-30 MED FILL — diazePAM 2 MG TABS: 2 | 3 days supply | Qty: 6 | Fill #0

## 2016-12-24 MED FILL — ZOLPIDEM TARTRATE 5 MG TABL: 5 | 30 days supply | Qty: 30 | Fill #0

## 2017-01-03 MED FILL — PHENTERMINE 37.5 MG TABLET: 37.5 | 30 days supply | Qty: 30 | Fill #2

## 2017-01-28 ENCOUNTER — Encounter (HOSPITAL_COMMUNITY): Payer: Self-pay | Admitting: Emergency Medicine

## 2017-01-28 ENCOUNTER — Other Ambulatory Visit: Payer: Self-pay

## 2017-01-28 ENCOUNTER — Ambulatory Visit (HOSPITAL_COMMUNITY)
Admission: EM | Admit: 2017-01-28 | Discharge: 2017-01-28 | Disposition: A | Discharge status: Home / Self Care | Payer: 59 | Attending: Family Medicine | Admitting: Family Medicine

## 2017-01-28 DIAGNOSIS — S61211A Laceration without foreign body of left index finger without damage to nail, initial encounter: Secondary | ICD-10-CM | POA: Diagnosis not present

## 2017-01-28 NOTE — ED Triage Notes (Signed)
Patient was cutting bacon and cut corner of left index finger.

## 2017-01-28 NOTE — Discharge Instructions (Signed)
Silver nitrate cauterization today. Surgicel applied in case of bleed through. You can remove dressing in 24 hours. Refrain from soaking finger in water for the next 48 hours. Limit index finger use for the next 24-48 hours to prevent dislodging cauterization done. Monitor for spreading redness, increased warmth, fever, follow up here or with PCP for evaluation.

## 2017-01-28 NOTE — ED Provider Notes (Signed)
[facility]    CSN: [id number] Arrival date & time: 01/28/17  1740     History   Chief Complaint Chief Complaint  Patient presents with  . Laceration    HPI Amy [name] is a 31 y.o. female.   31 year old female comes in with few hour laceration of left index finger. Patient states she was cutting bacon, when she cut through a piece of the finger tip. She rinsed area with water and cleaned with hydrogen peroxide. She applied constant pressure, but it continued to bleed, so she came in for evaluation. She is up to date on tetanus injection. Denies numbness/tingling.       Past Medical History:  Diagnosis Date  . Anxiety    with flying only  . Herpes     Patient Active Problem List   Diagnosis Date Noted  . 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 Acute pain of left knee 06/12/2016  . Prediabetes 05/31/2016  . Insomnia 11/04/2015  . Generalized anxiety disorder 11/04/2015    Past Surgical History:  Procedure Laterality Date  . CERVICAL BIOPSY  W/ LOOP ELECTRODE EXCISION      OB History    No data available       Home Medications    Prior to Admission medications   Medication Sig Start Date End Date Taking? Authorizing Provider  diazepam (VALIUM) 2 MG tablet Take 0.5-1 tablets (1-2 mg total) by mouth every 12 (twelve) hours as needed for anxiety. 11/28/16   Nche, Bonna Gainsharlotte Lum, NP  escitalopram (LEXAPRO) 20 MG tablet Take 1 tablet (20 mg total) by mouth daily. 11/28/16   Nche, Bonna Gainsharlotte Lum, NP  norgestimate-ethinyl estradiol (ORTHO-CYCLEN,SPRINTEC,PREVIFEM) 0.25-35 MG-MCG tablet Take 1 tablet by mouth daily. 05/31/16   Nche, Bonna Gainsharlotte Lum, NP  valACYclovir (VALTREX) 500 MG tablet Take 1 tablet (500 mg total) by mouth daily. 08/27/16   Nche, Bonna Gainsharlotte Lum, NP  zolpidem (AMBIEN) 5 MG tablet Take 1 tablet (5 mg total) by mouth at bedtime as needed. for sleep 11/28/16   Nche, Bonna Gainsharlotte Lum, NP    Family History Family History  Problem  Relation Age of Onset  . Hypertension Mother   . Hyperlipidemia Mother   . Hyperlipidemia Father   . Alcohol abuse Father   . Cirrhosis Father   . Hypertension Maternal Aunt   . Heart disease Paternal Grandmother     Social History Social History   Tobacco Use  . Smoking status: Never Smoker  . Smokeless tobacco: Never Used  Substance Use Topics  . Alcohol use: Yes    Comment: social  . Drug use: No     Allergies   Patient has no known allergies.   Review of Systems Review of Systems  Reason unable to perform ROS: See HPI as above.     Physical Exam Triage Vital Signs ED Triage Vitals  Enc Vitals Group     BP 01/28/17 1754 118/77     Pulse Rate 01/28/17 1754 96     Resp 01/28/17 1754 18     Temp 01/28/17 1754 98.7 F (37.1 C)     Temp Source 01/28/17 1754 Oral     SpO2 01/28/17 1754 100 %     Weight --      Height --      Head Circumference --      Peak Flow --      Pain Score 01/28/17 1752 8     Pain Loc --  Pain Edu? --      Excl. in GC? --    No data found.  Updated Vital Signs BP 118/77 (BP Location: Right Arm)   Pulse 96   Temp 98.7 F (37.1 C) (Oral)   Resp 18   SpO2 100%   Physical Exam  Constitutional: She is oriented to person, place, and time. She appears well-developed and well-nourished. No distress.  HENT:  Head: Normocephalic and atraumatic.  Eyes: Conjunctivae are normal. Pupils are equal, round, and reactive to light.  Neurological: She is alert and oriented to person, place, and time.  Skin:  1cm x 0.5cm removal of tip of left index finger. Continued bleeding. Sensation intact.      UC Treatments / Results  Labs (all labs ordered are listed, but only abnormal results are displayed) Labs Reviewed - No data to display  EKG  EKG Interpretation None       Radiology No results found.  Procedures Laceration Repair Date/Time: 01/28/2017 6:52 PM Performed by: Belinda FisherYu, Khyrie Masi V, PA-C Authorized by: Elvina SidleLauenstein, Kurt, MD     Consent:    Consent obtained:  Verbal   Consent given by:  Patient   Risks discussed:  Infection, pain, poor cosmetic result and poor wound healing   Alternatives discussed:  No treatment Anesthesia (see MAR for exact dosages):    Anesthesia method:  Nerve block   Block location:  Finger   Block needle gauge:  27 G   Block anesthetic:  Bupivacaine 0.5% w/o epi and lidocaine 2% w/o epi   Block outcome:  Anesthesia achieved Laceration details:    Location:  Finger   Finger location:  L index finger   Wound length (cm): 1cm x 0.5cm.   Depth (mm):  1 Exploration:    Wound exploration: wound explored through full range of motion   Treatment:    Area cleansed with:  Saline   Irrigation solution:  Sterile saline Post-procedure details:    Dressing:  Non-adherent dressing   Patient tolerance of procedure:  Tolerated well, no immediate complications Comments:     Given abrasion like laceration, suture repair not indicated. Discussed surgicel application vs cautery with silver nitrate. Patient preferred cauterization. Silver nitrate applied after wound cleaning. Patient tolerated procedure well. Hemostasis was achieved. Surgicel applied in case of bleed through with finger motion. Nonadherent gauze applied.    (including critical care time)  Medications Ordered in UC Medications - No data to display   Initial Impression / Assessment and Plan / UC Course  I have reviewed the triage vital signs and the nursing notes.  Pertinent labs & imaging results that were available during my care of the patient were reviewed by me and considered in my medical decision making (see chart for details).    Patient tolerated procedure well. Wound care instructions given. Tetanus up to date. Return precautions given.   Final Clinical Impressions(s) / UC Diagnoses   Final diagnoses:  Laceration of left index finger without foreign body without damage to nail, initial encounter    ED Discharge  Orders    None        Lurline IdolYu, Brylin Stanislawski V, PA-C 01/28/17 1856

## 2017-02-01 ENCOUNTER — Ambulatory Visit: Payer: Self-pay | Admitting: Family Medicine

## 2017-02-04 ENCOUNTER — Other Ambulatory Visit: Payer: Self-pay | Admitting: Nurse Practitioner

## 2017-02-04 MED FILL — VALACYCLOVIR HCL 500 MG TAB: 500 | 90 days supply | Qty: 90 | Fill #0

## 2017-02-08 DIAGNOSIS — A609 Anogenital herpesviral infection, unspecified: Secondary | ICD-10-CM | POA: Diagnosis not present

## 2017-02-08 DIAGNOSIS — Z01419 Encounter for gynecological examination (general) (routine) without abnormal findings: Secondary | ICD-10-CM | POA: Diagnosis not present

## 2017-02-08 DIAGNOSIS — E669 Obesity, unspecified: Secondary | ICD-10-CM | POA: Diagnosis not present

## 2017-02-08 DIAGNOSIS — Z3009 Encounter for other general counseling and advice on contraception: Secondary | ICD-10-CM | POA: Diagnosis not present

## 2017-02-08 DIAGNOSIS — Z6836 Body mass index (BMI) 36.0-36.9, adult: Secondary | ICD-10-CM | POA: Diagnosis not present

## 2017-02-11 LAB — HM PAP SMEAR: HM Pap smear: NEGATIVE

## 2017-02-28 ENCOUNTER — Ambulatory Visit (INDEPENDENT_AMBULATORY_CARE_PROVIDER_SITE_OTHER): Payer: 59 | Admitting: Nurse Practitioner

## 2017-02-28 ENCOUNTER — Encounter: Payer: Self-pay | Admitting: Nurse Practitioner

## 2017-02-28 VITALS — BP 110/74 | HR 91 | Temp 97.6°F | Ht 64.0 in | Wt 212.0 lb

## 2017-02-28 DIAGNOSIS — Z114 Encounter for screening for human immunodeficiency virus [HIV]: Secondary | ICD-10-CM

## 2017-02-28 DIAGNOSIS — Z0001 Encounter for general adult medical examination with abnormal findings: Secondary | ICD-10-CM

## 2017-02-28 DIAGNOSIS — Z13220 Encounter for screening for lipoid disorders: Secondary | ICD-10-CM | POA: Diagnosis not present

## 2017-02-28 DIAGNOSIS — Z0000 Encounter for general adult medical examination without abnormal findings: Secondary | ICD-10-CM | POA: Diagnosis not present

## 2017-02-28 DIAGNOSIS — E669 Obesity, unspecified: Secondary | ICD-10-CM | POA: Diagnosis not present

## 2017-02-28 DIAGNOSIS — R7303 Prediabetes: Secondary | ICD-10-CM

## 2017-02-28 DIAGNOSIS — Z3041 Encounter for surveillance of contraceptive pills: Secondary | ICD-10-CM | POA: Diagnosis not present

## 2017-02-28 DIAGNOSIS — E559 Vitamin D deficiency, unspecified: Secondary | ICD-10-CM

## 2017-02-28 DIAGNOSIS — Z136 Encounter for screening for cardiovascular disorders: Secondary | ICD-10-CM

## 2017-02-28 DIAGNOSIS — F411 Generalized anxiety disorder: Secondary | ICD-10-CM

## 2017-02-28 LAB — POCT URINE PREGNANCY: PREG TEST UR: NEGATIVE

## 2017-02-28 MED ORDER — LORCASERIN HCL 10 MG PO TABS: 1.0000 | ORAL_TABLET | ORAL | 0 refills | Status: DC

## 2017-02-28 MED ORDER — NORGESTIMATE-ETH ESTRADIOL 0.25-35 MG-MCG PO TABS
1.0000 | ORAL_TABLET | Freq: Every day | ORAL | 11 refills | Status: DC
Start: 2017-02-28 — End: 2017-10-07

## 2017-02-28 MED ORDER — ESCITALOPRAM OXALATE 20 MG PO TABS: 20.0000 mg | ORAL_TABLET | Freq: Every day | ORAL | 1 refills | Status: DC

## 2017-02-28 MED FILL — ESCITALOPRAM 20 MG TABLET: 20 | 90 days supply | Qty: 90 | Fill #0

## 2017-02-28 MED FILL — NORG-ETHIN ESTRA 0.25-0.035: 0.25-35 | 84 days supply | Qty: 84 | Fill #0

## 2017-02-28 NOTE — Progress Notes (Signed)
Subjective:    Patient ID: Amy [name], female    DOB: [birth year], 32 y.o.   MRN: [id number]  Patient presents today for complete physical  HPI  Obesity: Has not made necessary changes with diet. States she has increased appetite and craving sweet items. Weight loss goal 60Ibs. Does not exercise consistently. Wt Readings from Last 3 Encounters:  02/28/17 212 lb (96.2 kg)  11/28/16 206 lb (93.4 kg)  11/19/16 206 lb 6.1 oz (93.6 kg)   Immunizations: (TDAP, Hep C screen, Pneumovax, Influenza, zoster)  Health Maintenance  Topic Date Due  . Pap Smear  11/21/2017  . Tetanus Vaccine  10/23/2026  . Flu Shot  Completed  . HIV Screening  Completed   Diet:regular.  Weight:  Wt Readings from Last 3 Encounters:  02/28/17 212 lb (96.2 kg)  11/28/16 206 lb (93.4 kg)  11/19/16 206 lb 6.1 oz (93.6 kg)   Exercise:none.  Fall Risk: Fall Risk  02/28/2017 11/28/2016  Falls in the past year? Yes No  Number falls in past yr: 1 -  Injury with Fall? Yes -  Comment knee injury -   Home Safety:home with husband.  Depression/Suicide: reports stable mood with lexapro Depression screen [facility] 2/9 02/28/2017 11/28/2016  Decreased Interest 0 0  Down, Depressed, Hopeless 0 0  PHQ - 2 Score 0 0   Pap Smear (every 36yrs for >21-29 without HPV, every 26yrs for >30-16yrs with HPV):up to date. Done by GYN, record requested.  Vision:up to date.  Dental:up to date.  Medications and allergies reviewed with patient and updated if appropriate.  Patient Active Problem List   Diagnosis Date Noted  . Vitamin D deficiency 03/04/2017  . Encounter for surveillance of contraceptive pills 03/04/2017  . Obesity (BMI 35.0-39.9 without comorbidity) 03/04/2017  . 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 Prediabetes 05/31/2016  . Insomnia 11/04/2015  . Generalized anxiety disorder 11/04/2015    Current Outpatient Medications on File Prior to Visit  Medication Sig Dispense Refill  . valACYclovir  (VALTREX) 500 MG tablet Take 1 tablet (500 mg total) by mouth daily. 90 tablet 1  . zolpidem (AMBIEN) 5 MG tablet Take 1 tablet (5 mg total) by mouth at bedtime as needed. for sleep 30 tablet 2   No current facility-administered medications on file prior to visit.     Past Medical History:  Diagnosis Date  . Anxiety    with flying only  . Herpes     Past Surgical History:  Procedure Laterality Date  . CERVICAL BIOPSY  W/ LOOP ELECTRODE EXCISION      Social History   Socioeconomic History  . Marital status: Married    Spouse name: None  . Number of children: None  . Years of education: None  . Highest education level: None  Social Needs  . Financial resource strain: None  . Food insecurity - worry: None  . Food insecurity - inability: None  . Transportation needs - medical: None  . Transportation needs - non-medical: None  Occupational History  . None  Tobacco Use  . Smoking status: Never Smoker  . Smokeless tobacco: Never Used  Substance and Sexual Activity  . Alcohol use: Yes    Comment: social  . Drug use: No  . Sexual activity: Yes    Birth control/protection: Condom  Other Topics Concern  . None  Social History Narrative  . None    Family History  Problem Relation Age of Onset  . Hypertension Mother   .  Hyperlipidemia Mother   . Hyperlipidemia Father   . Alcohol abuse Father   . Cirrhosis Father   . Hypertension Maternal Aunt   . Heart disease Paternal Grandmother        Review of Systems  Constitutional: Negative for fever, malaise/fatigue and weight loss.  HENT: Negative for congestion and sore throat.   Eyes:       Negative for visual changes  Respiratory: Negative for cough and shortness of breath.   Cardiovascular: Negative for chest pain, palpitations and leg swelling.  Gastrointestinal: Negative for blood in stool, constipation, diarrhea and heartburn.  Genitourinary: Negative for dysuria, frequency and urgency.  Musculoskeletal:  Negative for falls, joint pain and myalgias.  Skin: Negative for rash.  Neurological: Negative for dizziness, sensory change and headaches.  Endo/Heme/Allergies: Does not bruise/bleed easily.  Psychiatric/Behavioral: Negative for depression, substance abuse and suicidal ideas. The patient is not nervous/anxious and does not have insomnia.     Objective:   Vitals:   02/28/17 1548  BP: 110/74  Pulse: 91  Temp: 97.6 F (36.4 C)  SpO2: 98%    Body mass index is 36.39 kg/m.   Physical Examination:  Physical Exam  Constitutional: She is oriented to person, place, and time and well-developed, well-nourished, and in no distress. No distress.  HENT:  Right Ear: External ear normal.  Left Ear: External ear normal.  Nose: Nose normal.  Mouth/Throat: Oropharynx is clear and moist. No oropharyngeal exudate.  Eyes: Conjunctivae and EOM are normal. Pupils are equal, round, and reactive to light. No scleral icterus.  Neck: Normal range of motion. Neck supple. No thyromegaly present.  Cardiovascular: Normal rate, normal heart sounds and intact distal pulses.  Pulmonary/Chest: Effort normal and breath sounds normal. She exhibits no tenderness.  Abdominal: Soft. Bowel sounds are normal. She exhibits no distension. There is no tenderness.  Musculoskeletal: Normal range of motion. She exhibits no edema or tenderness.  Lymphadenopathy:    She has no cervical adenopathy.  Neurological: She is alert and oriented to person, place, and time. Gait normal.  Skin: Skin is warm and dry.  Psychiatric: Affect and judgment normal.    ASSESSMENT and PLAN:  Bentleigh was seen today for annual exam.  Diagnoses and all orders for this visit:  Encounter for preventative adult health care exam with abnormal findings -     POCT urine pregnancy -     CBC with Differential/Platelet -     Comprehensive metabolic panel -     TSH -     VITAMIN D 25 Hydroxy (Vit-D Deficiency, Fractures) -     Lipid panel -      HIV antibody  Generalized anxiety disorder -     escitalopram (LEXAPRO) 20 MG tablet; Take 1 tablet (20 mg total) by mouth daily.  Prediabetes -     Hemoglobin A1c  Obesity (BMI 35.0-39.9 without comorbidity) -     Hemoglobin A1c -     Lorcaserin HCl 10 MG TABS; Take 1 tablet by mouth every morning. With food  Encounter for surveillance of contraceptive pills -     POCT urine pregnancy -     norgestimate-ethinyl estradiol (ORTHO-CYCLEN,SPRINTEC,PREVIFEM) 0.25-35 MG-MCG tablet; Take 1 tablet by mouth daily.  Encounter for lipid screening for cardiovascular disease -     Lipid panel  Encounter for screening for HIV -     HIV antibody  Vitamin D deficiency -     Vitamin D, Ergocalciferol, (DRISDOL) 50000 units CAPS capsule; Take 1 capsule (  50,000 Units total) by mouth every 7 (seven) days.   Obesity (BMI 35.0-39.9 without comorbidity) Unable to tolerate phentermine in past due to side effects (insomnia, dizziness). Provided anticipatory guidance about weight loss medications (belviq, contrave, and Qsymia). We discussed possible side effects, importance to combine with healthy diet, decrease portion and daily exercise regimen. She decided to try Belviq  Vitamin D deficiency Start vit D 50,000IU once a week x 2months. Repeat levels in 3months  Generalized anxiety disorder Mood is stable. Maintain lexapro 20mg .     Follow up: Return in about 4 weeks (around 03/28/2017) for weight loss.  Alysia Penna, NP

## 2017-02-28 NOTE — Patient Instructions (Addendum)
Labs are stable except very low vitamin D and elevated cholesterol. Vitamin D Rx sent. Need to to maintain healthy diet to improve lipid panel numbers   Maintenance, Female Adopting a healthy lifestyle and getting preventive care can go a long way to promote health and wellness. Talk with your health care provider about what schedule of regular examinations is right for you. This is a good chance for you to check in with your provider about disease prevention and staying healthy. In between checkups, there are plenty of things you can do on your own. Experts have done a lot of research about which lifestyle changes and preventive measures are most likely to keep you healthy. Ask your health care provider for more information. Weight and diet Eat a healthy diet  Be sure to include plenty of vegetables, fruits, low-fat dairy products, and lean protein.  Do not eat a lot of foods high in solid fats, added sugars, or salt.  Get regular exercise. This is one of the most important things you can do for your health. ? Most adults should exercise for at least 150 minutes each week. The exercise should increase your heart rate and make you sweat (moderate-intensity exercise). ? Most adults should also do strengthening exercises at least twice a week. This is in addition to the moderate-intensity exercise.  Maintain a healthy weight  Body mass index (BMI) is a measurement that can be used to identify possible weight problems. It estimates body fat based on height and weight. Your health care provider can help determine your BMI and help you achieve or maintain a healthy weight.  For females 49 years of age and older: ? A BMI below 18.5 is considered underweight. ? A BMI of 18.5 to 24.9 is normal. ? A BMI of 25 to 29.9 is considered overweight. ? A BMI of 30 and above is considered obese.  Watch levels of cholesterol and blood lipids  You should start having your blood tested for lipids and  cholesterol at 30 years of age, then have this test every 5 years.  You may need to have your cholesterol levels checked more often if: ? Your lipid or cholesterol levels are high. ? You are older than 30 years of age. ? You are at high risk for heart disease.  Cancer screening Lung Cancer  Lung cancer screening is recommended for adults 55-65 years old who are at high risk for lung cancer because of a history of smoking.  A yearly low-dose CT scan of the lungs is recommended for people who: ? Currently smoke. ? Have quit within the past 15 years. ? Have at least a 30-pack-year history of smoking. A pack year is smoking an average of one pack of cigarettes a day for 1 year.  Yearly screening should continue until it has been 15 years since you quit.  Yearly screening should stop if you develop a health problem that would prevent you from having lung cancer treatment.  Breast Cancer  Practice breast self-awareness. This means understanding how your breasts normally appear and feel.  It also means doing regular breast self-exams. Let your health care provider know about any changes, no matter how small.  If you are in your 20s or 30s, you should have a clinical breast exam (CBE) by a health care provider every 1-3 years as part of a regular health exam.  If you are 66 or older, have a CBE every year. Also consider having a breast X-ray (mammogram) every  year.  If you have a family history of breast cancer, talk to your health care provider about genetic screening.  If you are at high risk for breast cancer, talk to your health care provider about having an MRI and a mammogram every year.  Breast cancer gene (BRCA) assessment is recommended for women who have family members with BRCA-related cancers. BRCA-related cancers include: ? Breast. ? Ovarian. ? Tubal. ? Peritoneal cancers.  Results of the assessment will determine the need for genetic counseling and BRCA1 and BRCA2  testing.  Cervical Cancer Your health care provider may recommend that you be screened regularly for cancer of the pelvic organs (ovaries, uterus, and vagina). This screening involves a pelvic examination, including checking for microscopic changes to the surface of your cervix (Pap test). You may be encouraged to have this screening done every 3 years, beginning at age 54.  For women ages 31-65, health care providers may recommend pelvic exams and Pap testing every 3 years, or they may recommend the Pap and pelvic exam, combined with testing for human papilloma virus (HPV), every 5 years. Some types of HPV increase your risk of cervical cancer. Testing for HPV may also be done on women of any age with unclear Pap test results.  Other health care providers may not recommend any screening for nonpregnant women who are considered low risk for pelvic cancer and who do not have symptoms. Ask your health care provider if a screening pelvic exam is right for you.  If you have had past treatment for cervical cancer or a condition that could lead to cancer, you need Pap tests and screening for cancer for at least 20 years after your treatment. If Pap tests have been discontinued, your risk factors (such as having a new sexual partner) need to be reassessed to determine if screening should resume. Some women have medical problems that increase the chance of getting cervical cancer. In these cases, your health care provider may recommend more frequent screening and Pap tests.  Colorectal Cancer  This type of cancer can be detected and often prevented.  Routine colorectal cancer screening usually begins at 30 years of age and continues through 30 years of age.  Your health care provider may recommend screening at an earlier age if you have risk factors for colon cancer.  Your health care provider may also recommend using home test kits to check for hidden blood in the stool.  A small camera at the end of a  tube can be used to examine your colon directly (sigmoidoscopy or colonoscopy). This is done to check for the earliest forms of colorectal cancer.  Routine screening usually begins at age 36.  Direct examination of the colon should be repeated every 5-10 years through 30 years of age. However, you may need to be screened more often if early forms of precancerous polyps or small growths are found.  Skin Cancer  Check your skin from head to toe regularly.  Tell your health care provider about any new moles or changes in moles, especially if there is a change in a mole's shape or color.  Also tell your health care provider if you have a mole that is larger than the size of a pencil eraser.  Always use sunscreen. Apply sunscreen liberally and repeatedly throughout the day.  Protect yourself by wearing long sleeves, pants, a wide-brimmed hat, and sunglasses whenever you are outside.  Heart disease, diabetes, and high blood pressure  High blood pressure causes heart  disease and increases the risk of stroke. High blood pressure is more likely to develop in: ? People who have blood pressure in the high end of the normal range (130-139/85-89 mm Hg). ? People who are overweight or obese. ? People who are African American.  If you are 84-59 years of age, have your blood pressure checked every 3-5 years. If you are 61 years of age or older, have your blood pressure checked every year. You should have your blood pressure measured twice-once when you are at a hospital or clinic, and once when you are not at a hospital or clinic. Record the average of the two measurements. To check your blood pressure when you are not at a hospital or clinic, you can use: ? An automated blood pressure machine at a pharmacy. ? A home blood pressure monitor.  If you are between 66 years and 20 years old, ask your health care provider if you should take aspirin to prevent strokes.  Have regular diabetes screenings. This  involves taking a blood sample to check your fasting blood sugar level. ? If you are at a normal weight and have a low risk for diabetes, have this test once every three years after 30 years of age. ? If you are overweight and have a high risk for diabetes, consider being tested at a younger age or more often. Preventing infection Hepatitis B  If you have a higher risk for hepatitis B, you should be screened for this virus. You are considered at high risk for hepatitis B if: ? You were born in a country where hepatitis B is common. Ask your health care provider which countries are considered high risk. ? Your parents were born in a high-risk country, and you have not been immunized against hepatitis B (hepatitis B vaccine). ? You have HIV or AIDS. ? You use needles to inject street drugs. ? You live with someone who has hepatitis B. ? You have had sex with someone who has hepatitis B. ? You get hemodialysis treatment. ? You take certain medicines for conditions, including cancer, organ transplantation, and autoimmune conditions.  Hepatitis C  Blood testing is recommended for: ? Everyone born from [birth year] through 1963. ? Anyone with known risk factors for hepatitis C.  Sexually transmitted infections (STIs)  You should be screened for sexually transmitted infections (STIs) including gonorrhea and chlamydia if: ? You are sexually active and are younger than 30 years of age. ? You are older than 30 years of age and your health care provider tells you that you are at risk for this type of infection. ? Your sexual activity has changed since you were last screened and you are at an increased risk for chlamydia or gonorrhea. Ask your health care provider if you are at risk.  If you do not have HIV, but are at risk, it may be recommended that you take a prescription medicine daily to prevent HIV infection. This is called pre-exposure prophylaxis (PrEP). You are considered at risk if: ? You are  sexually active and do not regularly use condoms or know the HIV status of your partner(s). ? You take drugs by injection. ? You are sexually active with a partner who has HIV.  Talk with your health care provider about whether you are at high risk of being infected with HIV. If you choose to begin PrEP, you should first be tested for HIV. You should then be tested every 3 months for as long as you  are taking PrEP. Pregnancy  If you are premenopausal and you may become pregnant, ask your health care provider about preconception counseling.  If you may become pregnant, take 400 to 800 micrograms (mcg) of folic acid every day.  If you want to prevent pregnancy, talk to your health care provider about birth control (contraception). Osteoporosis and menopause  Osteoporosis is a disease in which the bones lose minerals and strength with aging. This can result in serious bone fractures. Your risk for osteoporosis can be identified using a bone density scan.  If you are 34 years of age or older, or if you are at risk for osteoporosis and fractures, ask your health care provider if you should be screened.  Ask your health care provider whether you should take a calcium or vitamin D supplement to lower your risk for osteoporosis.  Menopause may have certain physical symptoms and risks.  Hormone replacement therapy may reduce some of these symptoms and risks. Talk to your health care provider about whether hormone replacement therapy is right for you. Follow these instructions at home:  Schedule regular health, dental, and eye exams.  Stay current with your immunizations.  Do not use any tobacco products including cigarettes, chewing tobacco, or electronic cigarettes.  If you are pregnant, do not drink alcohol.  If you are breastfeeding, limit how much and how often you drink alcohol.  Limit alcohol intake to no more than 1 drink per day for nonpregnant women. One drink equals 12 ounces of  beer, 5 ounces of wine, or 1 ounces of hard liquor.  Do not use street drugs.  Do not share needles.  Ask your health care provider for help if you need support or information about quitting drugs.  Tell your health care provider if you often feel depressed.  Tell your health care provider if you have ever been abused or do not feel safe at home. This information is not intended to replace advice given to you by your health care provider. Make sure you discuss any questions you have with your health care provider. Document Released: 08/21/2010 Document Revised: 07/14/2015 Document Reviewed: 11/09/2014 Elsevier Interactive Patient Education  [employer].

## 2017-03-01 MED FILL — ZOLPIDEM TARTRATE 5 MG TABL: 5 | 30 days supply | Qty: 30 | Fill #1

## 2017-03-02 LAB — CBC WITH DIFFERENTIAL/PLATELET
BASOS ABS: 29 {cells}/uL (ref 0–200)
Basophils Relative: 0.3 %
EOS PCT: 0.7 %
Eosinophils Absolute: 68 cells/uL (ref 15–500)
HCT: 45.1 % — ABNORMAL HIGH (ref 35.0–45.0)
Hemoglobin: 14.7 g/dL (ref 11.7–15.5)
Lymphs Abs: 3201 cells/uL (ref 850–3900)
MCH: 29.4 pg (ref 27.0–33.0)
MCHC: 32.6 g/dL (ref 32.0–36.0)
MCV: 90.2 fL (ref 80.0–100.0)
MONOS PCT: 7.7 %
MPV: 9.8 fL (ref 7.5–12.5)
NEUTROS PCT: 58.3 %
Neutro Abs: 5655 cells/uL (ref 1500–7800)
PLATELETS: 261 10*3/uL (ref 140–400)
RBC: 5 10*6/uL (ref 3.80–5.10)
RDW: 13.2 % (ref 11.0–15.0)
TOTAL LYMPHOCYTE: 33 %
WBC mixed population: 747 cells/uL (ref 200–950)
WBC: 9.7 10*3/uL (ref 3.8–10.8)

## 2017-03-02 LAB — HEMOGLOBIN A1C
Hgb A1c MFr Bld: 5.3 % of total Hgb (ref <5.7)
Mean Plasma Glucose: 105 (calc)
eAG (mmol/L): 5.8 (calc)

## 2017-03-02 LAB — COMPREHENSIVE METABOLIC PANEL
AG Ratio: 1.2 (calc) (ref 1.0–2.5)
ALT: 19 U/L (ref 6–29)
AST: 18 U/L (ref 10–30)
Albumin: 4.3 g/dL (ref 3.6–5.1)
Alkaline phosphatase (APISO): 72 U/L (ref 33–115)
BUN: 16 mg/dL (ref 7–25)
CO2: 23 mmol/L (ref 20–32)
CREATININE: 0.78 mg/dL (ref 0.50–1.10)
Calcium: 9.1 mg/dL (ref 8.6–10.2)
Chloride: 100 mmol/L (ref 98–110)
GLUCOSE: 83 mg/dL (ref 65–99)
Globulin: 3.6 g/dL (calc) (ref 1.9–3.7)
Potassium: 4.1 mmol/L (ref 3.5–5.3)
SODIUM: 135 mmol/L (ref 135–146)
TOTAL PROTEIN: 7.9 g/dL (ref 6.1–8.1)
Total Bilirubin: 0.2 mg/dL (ref 0.2–1.2)

## 2017-03-02 LAB — LIPID PANEL
CHOL/HDL RATIO: 4.1 (calc) (ref <5.0)
CHOLESTEROL: 239 mg/dL — AB (ref <200)
HDL: 58 mg/dL (ref >50)
LDL Cholesterol (Calc): 161 mg/dL (calc) — ABNORMAL HIGH
NON-HDL CHOLESTEROL (CALC): 181 mg/dL — AB (ref <130)
TRIGLYCERIDES: 96 mg/dL (ref <150)

## 2017-03-02 LAB — HIV ANTIBODY (ROUTINE TESTING W REFLEX): HIV: NONREACTIVE

## 2017-03-02 LAB — TSH: TSH: 1.67 mIU/L

## 2017-03-02 LAB — VITAMIN D 25 HYDROXY (VIT D DEFICIENCY, FRACTURES): VIT D 25 HYDROXY: 14 ng/mL — AB (ref 30–100)

## 2017-03-04 ENCOUNTER — Encounter: Payer: Self-pay | Admitting: Nurse Practitioner

## 2017-03-04 DIAGNOSIS — Z3041 Encounter for surveillance of contraceptive pills: Secondary | ICD-10-CM | POA: Insufficient documentation

## 2017-03-04 DIAGNOSIS — E559 Vitamin D deficiency, unspecified: Secondary | ICD-10-CM | POA: Insufficient documentation

## 2017-03-04 DIAGNOSIS — E669 Obesity, unspecified: Secondary | ICD-10-CM | POA: Insufficient documentation

## 2017-03-04 MED ORDER — VITAMIN D (ERGOCALCIFEROL) 1.25 MG (50000 UNIT) PO CAPS: 50000.0000 [IU] | ORAL_CAPSULE | ORAL | 0 refills | Status: DC

## 2017-03-04 NOTE — Assessment & Plan Note (Signed)
Start vit D 50,000IU once a week x 2months. Repeat levels in 3months

## 2017-03-04 NOTE — Assessment & Plan Note (Signed)
Mood is stable. Maintain lexapro 20mg .

## 2017-03-04 NOTE — Assessment & Plan Note (Signed)
Unable to tolerate phentermine in past due to side effects (insomnia, dizziness). Provided anticipatory guidance about weight loss medications (belviq, contrave, and Qsymia). We discussed possible side effects, importance to combine with healthy diet, decrease portion and daily exercise regimen. She decided to try Belviq

## 2017-03-05 ENCOUNTER — Telehealth: Payer: Self-pay | Admitting: Nurse Practitioner

## 2017-03-05 MED FILL — VIT D2 1.25 MG (50,000 UNIT: 1.25 MG | 56 days supply | Qty: 8 | Fill #0

## 2017-03-05 NOTE — Telephone Encounter (Signed)
PA for Belviq 10 mg started. Waiting to response.   Emaleigh Haste (Key: [id number])

## 2017-03-08 MED FILL — BELVIQ 10 MG TABLET: 10 | 30 days supply | Qty: 30 | Fill #0

## 2017-03-08 NOTE — Telephone Encounter (Signed)
Notify pharmacy of the PA approval, and left detail massage inform pt.

## 2017-04-05 ENCOUNTER — Ambulatory Visit: Payer: Self-pay | Admitting: Nurse Practitioner

## 2017-05-13 MED FILL — VALACYCLOVIR HCL 500 MG TAB: 500 | 90 days supply | Qty: 90 | Fill #1

## 2017-05-13 MED FILL — ZOLPIDEM TARTRATE 5 MG TABL: 5 | 30 days supply | Qty: 30 | Fill #2

## 2017-05-22 ENCOUNTER — Encounter: Payer: Self-pay | Admitting: Nurse Practitioner

## 2017-05-22 ENCOUNTER — Telehealth: Payer: Self-pay | Admitting: Nurse Practitioner

## 2017-05-22 ENCOUNTER — Ambulatory Visit: Payer: 59 | Admitting: Nurse Practitioner

## 2017-05-22 VITALS — BP 112/76 | HR 72 | Temp 97.7°F | Ht 64.0 in | Wt 212.8 lb

## 2017-05-22 DIAGNOSIS — E669 Obesity, unspecified: Secondary | ICD-10-CM

## 2017-05-22 DIAGNOSIS — F411 Generalized anxiety disorder: Secondary | ICD-10-CM

## 2017-05-22 DIAGNOSIS — E559 Vitamin D deficiency, unspecified: Secondary | ICD-10-CM | POA: Diagnosis not present

## 2017-05-22 MED ORDER — VITAMIN D (CHOLECALCIFEROL) 25 MCG (1000 UT) PO CAPS: 1.0000 | ORAL_CAPSULE | Freq: Two times a day (BID) | ORAL | Status: DC

## 2017-05-22 MED ORDER — LORCASERIN HCL ER 20 MG PO TB24: 1.0000 | ORAL_TABLET | Freq: Every day | ORAL | 0 refills | Status: DC

## 2017-05-22 MED ORDER — BUPROPION HCL 75 MG PO TABS: 75.0000 mg | ORAL_TABLET | Freq: Every day | ORAL | 0 refills | Status: DC

## 2017-05-22 MED FILL — buPROPion HCL 75 MG TABS: 75 | 15 days supply | Qty: 15 | Fill #0

## 2017-05-22 MED FILL — BELVIQ XR 20 MG TABLET: 20 | 30 days supply | Qty: 30 | Fill #0

## 2017-05-22 NOTE — Assessment & Plan Note (Signed)
Start wellbutrin stop lexapro due to current use of belviq and continuous weight gain. F/up in 2weeks

## 2017-05-22 NOTE — Telephone Encounter (Signed)
Faxed information to pharmacy. TLG

## 2017-05-22 NOTE — Telephone Encounter (Signed)
Information was faxed to pharmacy.TLG

## 2017-05-22 NOTE — Patient Instructions (Signed)
Stop lexapro Start wellbutrin.  Continue belviq as prescribed  Continue weight watcher diet and regular exercise.

## 2017-05-22 NOTE — Telephone Encounter (Signed)
Copied from CRM [id number]. Topic: Quick Communication - Rx Refill/Question >> May 22, 2017 10:21 AM Crist InfanteHarrald, Kathy J wrote: Medication: Lorcaserin HCl ER (BELVIQ XR) 20 MG TB24  Pt wants you to be aware this med requires a prior auth, and hopes you can get this done quickly please. [facility] [place] [address] [phone] (Phone) [phone] (Fax)

## 2017-05-22 NOTE — Progress Notes (Signed)
Subjective:  Patient ID: Amy [name], female    DOB: 09/19/1985  Age: 32 y.o. MRN: [id number]  CC: Follow-up (F/U for meds. stopped Belviq at first and then started back. weight went up and then has been going down again since taking.)   HPI Obesity: BMI 36.5 Highest weight 220lbs. Started belviq, weight watchers, and exercise regimen 2.5weeks ago  exercise 3days a week (cycle, group exercise, weight training). Denies any adverse effects with  belviq  Anxiety and Depression: Stable to lexapro, but has noticed weight gain.  Outpatient Medications Prior to Visit  Medication Sig Dispense Refill  . norgestimate-ethinyl estradiol (ORTHO-CYCLEN,SPRINTEC,PREVIFEM) 0.25-35 MG-MCG tablet Take 1 tablet by mouth daily. 1 Package 11  . valACYclovir (VALTREX) 500 MG tablet Take 1 tablet (500 mg total) by mouth daily. 90 tablet 1  . zolpidem (AMBIEN) 5 MG tablet Take 1 tablet (5 mg total) by mouth at bedtime as needed. for sleep 30 tablet 2  . escitalopram (LEXAPRO) 20 MG tablet Take 1 tablet (20 mg total) by mouth daily. 90 tablet 1  . Lorcaserin HCl 10 MG TABS Take 1 tablet by mouth every morning. With food 30 tablet 0  . Vitamin D, Ergocalciferol, (DRISDOL) 50000 units CAPS capsule Take 1 capsule (50,000 Units total) by mouth every 7 (seven) days. 8 capsule 0   No facility-administered medications prior to visit.     ROS See HPI  Objective:  BP 112/76 (BP Location: Left Arm, Patient Position: Sitting, Cuff Size: Normal)   Pulse 72   Temp 97.7 F (36.5 C) (Oral)   Ht 5\' 4"  (1.626 m)   Wt 212 lb 12.8 oz (96.5 kg)   SpO2 96%   BMI 36.53 kg/m   BP Readings from Last 3 Encounters:  05/22/17 112/76  02/28/17 110/74  01/28/17 118/77    Wt Readings from Last 3 Encounters:  05/22/17 212 lb 12.8 oz (96.5 kg)  02/28/17 212 lb (96.2 kg)  11/28/16 206 lb (93.4 kg)    Physical Exam  Constitutional: She is oriented to person, place, and time. No distress.  Cardiovascular:  Regular rhythm and normal heart sounds.  Pulmonary/Chest: Effort normal and breath sounds normal.  Musculoskeletal: She exhibits no edema.  Neurological: She is alert and oriented to person, place, and time.  Psychiatric: She has a normal mood and affect. Her behavior is normal. Thought content normal.  Vitals reviewed.   Lab Results  Component Value Date   WBC 9.7 02/28/2017   HGB 14.7 02/28/2017   HCT 45.1 (H) 02/28/2017   PLT 261 02/28/2017   GLUCOSE 83 02/28/2017   CHOL 239 (H) 02/28/2017   TRIG 96 02/28/2017   HDL 58 02/28/2017   LDLCALC 161 (H) 02/28/2017   ALT 19 02/28/2017   AST 18 02/28/2017   NA 135 02/28/2017   K 4.1 02/28/2017   CL 100 02/28/2017   CREATININE 0.78 02/28/2017   BUN 16 02/28/2017   CO2 23 02/28/2017   TSH 1.67 02/28/2017   HGBA1C 5.3 02/28/2017    Assessment & Plan:   Sway was seen today for follow-up.  Diagnoses and all orders for this visit:  Generalized anxiety disorder -     buPROPion (WELLBUTRIN) 75 MG tablet; Take 1 tablet (75 mg total) by mouth daily. -     Ambulatory referral to Psychology  Obesity (BMI 35.0-39.9 without comorbidity) -     Lorcaserin HCl ER (BELVIQ XR) 20 MG TB24; Take 1 tablet by mouth daily after breakfast.  Vitamin  D deficiency -     Vitamin D, Cholecalciferol, 1000 units CAPS; Take 1 capsule by mouth 2 (two) times daily.   I have discontinued Paz V. Panepinto's Lorcaserin HCl, escitalopram, and Vitamin D (Ergocalciferol). I am also having her start on Lorcaserin HCl ER, buPROPion, and Vitamin D (Cholecalciferol). Additionally, I am having her maintain her valACYclovir, zolpidem, and norgestimate-ethinyl estradiol.  Meds ordered this encounter  Medications  . Lorcaserin HCl ER (BELVIQ XR) 20 MG TB24    Sig: Take 1 tablet by mouth daily after breakfast.    Dispense:  30 tablet    Refill:  0    Order Specific Question:   Supervising Provider    Answer:   Dianne DunARON, TALIA M [[id number]]  . buPROPion (WELLBUTRIN) 75  MG tablet    Sig: Take 1 tablet (75 mg total) by mouth daily.    Dispense:  15 tablet    Refill:  0    Order Specific Question:   Supervising Provider    Answer:   Dianne DunARON, TALIA M [[id number]]  . Vitamin D, Cholecalciferol, 1000 units CAPS    Sig: Take 1 capsule by mouth 2 (two) times daily.    Dispense:  60 capsule    Order Specific Question:   Supervising Provider    Answer:   Dianne DunARON, TALIA M [[id number]]    Follow-up: Return in about 2 weeks (around 06/05/2017) for anxiety and weight loss counsel (check BMP and vitamin D).  Alysia Pennaharlotte Traniece Boffa, NP

## 2017-05-28 ENCOUNTER — Ambulatory Visit (INDEPENDENT_AMBULATORY_CARE_PROVIDER_SITE_OTHER): Payer: 59 | Admitting: Licensed Clinical Social Worker

## 2017-05-28 DIAGNOSIS — F411 Generalized anxiety disorder: Secondary | ICD-10-CM | POA: Diagnosis not present

## 2017-06-05 ENCOUNTER — Encounter: Payer: Self-pay | Admitting: Nurse Practitioner

## 2017-06-05 ENCOUNTER — Ambulatory Visit: Payer: 59 | Admitting: Nurse Practitioner

## 2017-06-05 DIAGNOSIS — F411 Generalized anxiety disorder: Secondary | ICD-10-CM | POA: Diagnosis not present

## 2017-06-05 MED ORDER — BUPROPION HCL 75 MG PO TABS: 75.0000 mg | ORAL_TABLET | Freq: Two times a day (BID) | ORAL | 0 refills | Status: DC

## 2017-06-05 MED FILL — buPROPion HCL 75 MG TABS: 75 | 30 days supply | Qty: 60 | Fill #0

## 2017-06-05 NOTE — Progress Notes (Signed)
Subjective:  Patient ID: Amy [name], female    DOB: 21-Feb-1985  Age: 32 y.o. MRN: [id number]  CC: Follow-up (F/U for anxiety. seeing a counselor, has appt tomorrow.)  Anxiety  Presents for follow-up visit. Symptoms include depressed mood, excessive worry, insomnia, muscle tension, nervous/anxious behavior and restlessness. Patient reports no suicidal ideas. Symptoms occur most days. The severity of symptoms is causing significant distress and interfering with daily activities. The quality of sleep is fair. Nighttime awakenings: occasional.    has discontinued lexapro. Started Wellbutrin. Denies any adverse effects. Started counseling sessions with psychologist.  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 Worried about not having enough money to do the things she like: traveling and going out with friends. Pressure from family about starting a family.  Outpatient Medications Prior to Visit  Medication Sig Dispense Refill  . Lorcaserin HCl ER (BELVIQ XR) 20 MG TB24 Take 1 tablet by mouth daily after breakfast. 30 tablet 0  . norgestimate-ethinyl estradiol (ORTHO-CYCLEN,SPRINTEC,PREVIFEM) 0.25-35 MG-MCG tablet Take 1 tablet by mouth daily. 1 Package 11  . valACYclovir (VALTREX) 500 MG tablet Take 1 tablet (500 mg total) by mouth daily. 90 tablet 1  . Vitamin D, Cholecalciferol, 1000 units CAPS Take 1 capsule by mouth 2 (two) times daily. 60 capsule   . zolpidem (AMBIEN) 5 MG tablet Take 1 tablet (5 mg total) by mouth at bedtime as needed. for sleep 30 tablet 2  . buPROPion (WELLBUTRIN) 75 MG tablet Take 1 tablet (75 mg total) by mouth daily. 15 tablet 0   No facility-administered medications prior to visit.     ROS See HPI  Objective:  BP 110/72 (BP Location: Left Arm, Patient Position: Sitting, Cuff Size: Normal)   Pulse 70   Temp 98.2 F (36.8 C) (Oral)   Ht 5\' 4"  (1.626 m)   Wt 212  lb 12.8 oz (96.5 kg)   SpO2 98%   BMI 36.53 kg/m   BP Readings from Last 3 Encounters:  06/05/17 110/72  05/22/17 112/76  02/28/17 110/74    Wt Readings from Last 3 Encounters:  06/05/17 212 lb 12.8 oz (96.5 kg)  05/22/17 212 lb 12.8 oz (96.5 kg)  02/28/17 212 lb (96.2 kg)    Physical Exam  Constitutional: No distress.  Cardiovascular: Normal rate.  Pulmonary/Chest: Effort normal.  Psychiatric: Judgment and thought content normal. Her mood appears anxious. She is agitated. Cognition and memory are normal.  Intermittent crying during OV.  Vitals reviewed.   Lab Results  Component Value Date   WBC 9.7 02/28/2017   HGB 14.7 02/28/2017   HCT 45.1 (H) 02/28/2017   PLT 261 02/28/2017   GLUCOSE 83 02/28/2017   CHOL 239 (H) 02/28/2017   TRIG 96 02/28/2017   HDL 58 02/28/2017   LDLCALC 161 (H) 02/28/2017   ALT 19 02/28/2017   AST 18 02/28/2017   NA 135 02/28/2017   K 4.1 02/28/2017   CL 100 02/28/2017   CREATININE 0.78 02/28/2017   BUN 16 02/28/2017   CO2 23 02/28/2017   TSH 1.67 02/28/2017   HGBA1C 5.3 02/28/2017    No results found.  Assessment & Plan:   Mersadie was seen today for follow-up.  Diagnoses and all orders for this visit:  Generalized anxiety disorder -     buPROPion (WELLBUTRIN) 75 MG tablet; Take 1 tablet (75 mg total) by mouth 2 (two) times daily.  I have changed Laketia V. Entwistle's buPROPion. I am also having her maintain her valACYclovir, zolpidem, norgestimate-ethinyl estradiol, Lorcaserin HCl ER, and Vitamin D (Cholecalciferol).  Meds ordered this encounter  Medications  . buPROPion (WELLBUTRIN) 75 MG tablet    Sig: Take 1 tablet (75 mg total) by mouth 2 (two) times daily.    Dispense:  60 tablet    Refill:  0    Order Specific Question:   Supervising Provider    Answer:   Dianne DunARON, TALIA M [[id number]]    Follow-up: Return in about 4 weeks (around 07/01/2017) for weight loss and anxiety.  Alysia Pennaharlotte Anacaren Kohan, NP

## 2017-06-05 NOTE — Patient Instructions (Signed)

## 2017-06-06 ENCOUNTER — Other Ambulatory Visit: Payer: Self-pay | Admitting: Nurse Practitioner

## 2017-06-06 ENCOUNTER — Ambulatory Visit (INDEPENDENT_AMBULATORY_CARE_PROVIDER_SITE_OTHER): Payer: 59 | Admitting: Licensed Clinical Social Worker

## 2017-06-06 DIAGNOSIS — G4700 Insomnia, unspecified: Secondary | ICD-10-CM

## 2017-06-06 DIAGNOSIS — F419 Anxiety disorder, unspecified: Secondary | ICD-10-CM | POA: Diagnosis not present

## 2017-06-06 NOTE — Telephone Encounter (Signed)
refill 

## 2017-06-06 NOTE — Telephone Encounter (Signed)
Rx faxed to pt pharmacy. TLG

## 2017-06-10 ENCOUNTER — Telehealth: Payer: Self-pay | Admitting: Nurse Practitioner

## 2017-06-10 NOTE — Telephone Encounter (Signed)
Copied from CRM [phone]. Topic: Quick Communication - See Telephone Encounter >> Jun 10, 2017  2:48 PM Clack, Princella PellegriniJessica D wrote: CRM for notification. See Telephone encounter for: 06/10/17.  [facility] with like Alysia Pennaharlotte Nche to give her a call. States she has some questions about the pt.  Contact # [phone]

## 2017-06-11 ENCOUNTER — Encounter: Payer: Self-pay | Admitting: Nurse Practitioner

## 2017-06-11 MED FILL — PROMETHAZINE-DM SYRUP: 6.25-15 | 4 days supply | Qty: 150 | Fill #0

## 2017-06-11 MED FILL — OSELTAMIVIR PHOSPHATE 75 MG: 75 | 5 days supply | Qty: 10 | Fill #0

## 2017-06-21 ENCOUNTER — Ambulatory Visit: Payer: 59 | Admitting: Nurse Practitioner

## 2017-06-21 ENCOUNTER — Encounter: Payer: Self-pay | Admitting: Nurse Practitioner

## 2017-06-21 VITALS — BP 110/74 | HR 81 | Temp 98.0°F | Ht 64.0 in | Wt 208.8 lb

## 2017-06-21 DIAGNOSIS — E6609 Other obesity due to excess calories: Secondary | ICD-10-CM | POA: Diagnosis not present

## 2017-06-21 DIAGNOSIS — F411 Generalized anxiety disorder: Secondary | ICD-10-CM | POA: Diagnosis not present

## 2017-06-21 DIAGNOSIS — Z6835 Body mass index (BMI) 35.0-35.9, adult: Secondary | ICD-10-CM

## 2017-06-21 DIAGNOSIS — J00 Acute nasopharyngitis [common cold]: Secondary | ICD-10-CM

## 2017-06-21 MED ORDER — BUPROPION HCL 75 MG PO TABS: 75.0000 mg | ORAL_TABLET | Freq: Two times a day (BID) | ORAL | 0 refills | Status: DC

## 2017-06-21 MED ORDER — CHLORPHEN-PE-ACETAMINOPHEN 4-10-325 MG PO TABS: 1.0000 | ORAL_TABLET | Freq: Three times a day (TID) | ORAL | 0 refills | Status: DC

## 2017-06-21 MED ORDER — BENZONATATE 100 MG PO CAPS: 100.0000 mg | ORAL_CAPSULE | Freq: Three times a day (TID) | ORAL | 0 refills | Status: DC | PRN

## 2017-06-21 MED ORDER — FLUTICASONE PROPIONATE 50 MCG/ACT NA SUSP: 2.0000 | Freq: Every day | NASAL | 0 refills | Status: DC

## 2017-06-21 MED ORDER — LORCASERIN HCL ER 20 MG PO TB24: 1.0000 | ORAL_TABLET | Freq: Every day | ORAL | 0 refills | Status: DC

## 2017-06-21 NOTE — Patient Instructions (Signed)
Upper Respiratory Infection, Adult Most upper respiratory infections (URIs) are caused by a virus. A URI affects the nose, throat, and upper air passages. The most common type of URI is often called "the common cold." Follow these instructions at home:  Take medicines only as told by your doctor.  Gargle warm saltwater or take cough drops to comfort your throat as told by your doctor.  Use a warm mist humidifier or inhale steam from a shower to increase air moisture. This may make it easier to breathe.  Drink enough fluid to keep your pee (urine) clear or pale yellow.  Eat soups and other clear broths.  Have a healthy diet.  Rest as needed.  Go back to work when your fever is gone or your doctor says it is okay. ? You may need to stay home longer to avoid giving your URI to others. ? You can also wear a face mask and wash your hands often to prevent spread of the virus.  Use your inhaler more if you have asthma.  Do not use any tobacco products, including cigarettes, chewing tobacco, or electronic cigarettes. If you need help quitting, ask your doctor. Contact a doctor if:  You are getting worse, not better.  Your symptoms are not helped by medicine.  You have chills.  You are getting more short of breath.  You have brown or red mucus.  You have yellow or brown discharge from your nose.  You have pain in your face, especially when you bend forward.  You have a fever.  You have puffy (swollen) neck glands.  You have pain while swallowing.  You have white areas in the back of your throat. Get help right away if:  You have very bad or constant: ? Headache. ? Ear pain. ? Pain in your forehead, behind your eyes, and over your cheekbones (sinus pain). ? Chest pain.  You have long-lasting (chronic) lung disease and any of the following: ? Wheezing. ? Long-lasting cough. ? Coughing up blood. ? A change in your usual mucus.  You have a stiff neck.  You have  changes in your: ? Vision. ? Hearing. ? Thinking. ? Mood. This information is not intended to replace advice given to you by your health care provider. Make sure you discuss any questions you have with your health care provider. Document Released: 07/25/2007 Document Revised: 10/09/2015 Document Reviewed: 05/13/2013 Elsevier Interactive Patient Education  2018 Elsevier Inc.  

## 2017-06-21 NOTE — Progress Notes (Signed)
Subjective:  Patient ID: Amy [name], female    DOB: 01-05-1986  Age: 32 y.o. MRN: [id number]  CC: Cough (coughing,sore throat,painful to swallow. dx with flu 1 wk ago)  HPI   Cough: Onset 1week ago Symptoms: Sinus congestion, chills, fever 101 and Headache. Consulted with E-visit 5days ago.  treated for flu like symptoms Prescribed tamiflu, and promethazine DM. Moderate symptoms relief, but persistent cough and nasal congestion, worse at night and when talking. Has Also used mucinex, robitussin OTC with minal relief. No fever for last 48hrs.  Anxiety: Denies any adverse effects with wellbutrin. Stable mood.  Outpatient Medications Prior to Visit  Medication Sig Dispense Refill  . norgestimate-ethinyl estradiol (ORTHO-CYCLEN,SPRINTEC,PREVIFEM) 0.25-35 MG-MCG tablet Take 1 tablet by mouth daily. 1 Package 11  . valACYclovir (VALTREX) 500 MG tablet Take 1 tablet (500 mg total) by mouth daily. 90 tablet 1  . Vitamin D, Cholecalciferol, 1000 units CAPS Take 1 capsule by mouth 2 (two) times daily. 60 capsule   . zolpidem (AMBIEN) 5 MG tablet TAKE 1 TABLET BY MOUTH AT BEDTIME AS NEEDED FOR SLEEP 30 tablet 0  . buPROPion (WELLBUTRIN) 75 MG tablet Take 1 tablet (75 mg total) by mouth 2 (two) times daily. 60 tablet 0  . Lorcaserin HCl ER (BELVIQ XR) 20 MG TB24 Take 1 tablet by mouth daily after breakfast. 30 tablet 0   No facility-administered medications prior to visit.     ROS Review of Systems  Constitutional: Negative for chills, diaphoresis and fever.  HENT: Positive for congestion, sinus pain and sore throat.   Respiratory: Positive for cough and sputum production. Negative for shortness of breath.   Cardiovascular: Negative for chest pain and leg swelling.  Musculoskeletal: Negative for joint pain.  Skin: Negative.     Objective:  BP 110/74   Pulse 81   Temp 98 F (36.7 C) (Oral)   Ht  (1.626 m)   Wt 208 lb 12.8 oz (94.7 kg)   SpO2 97%   BMI 35.84 kg/m    BP Readings from Last 3 Encounters:  06/21/17 110/74  06/05/17 110/72  05/22/17 112/76    Wt Readings from Last 3 Encounters:  06/21/17 208 lb 12.8 oz (94.7 kg)  06/05/17 212 lb 12.8 oz (96.5 kg)  05/22/17 212 lb 12.8 oz (96.5 kg)    Physical Exam  Constitutional: No distress.  HENT:  Right Ear: Tympanic membrane, external ear and ear canal normal. No middle ear effusion.  Left Ear: Tympanic membrane, external ear and ear canal normal.  No middle ear effusion.  Nose: Mucosal edema and rhinorrhea present. Right sinus exhibits no maxillary sinus tenderness and no frontal sinus tenderness. Left sinus exhibits no maxillary sinus tenderness and no frontal sinus tenderness.  Mouth/Throat: No oropharyngeal exudate.  Eyes: Pupils are equal, round, and reactive to light. Conjunctivae and EOM are normal.  Neck: Normal range of motion. Neck supple.  Cardiovascular: Normal rate, regular rhythm and normal heart sounds.  Pulmonary/Chest: Effort normal and breath sounds normal. No respiratory distress.  Lymphadenopathy:    She has cervical adenopathy.  Skin: Skin is warm and dry.  Psychiatric: She has a normal mood and affect. Her behavior is normal.  Vitals reviewed.   Lab Results  Component Value Date   WBC 9.7 02/28/2017   HGB 14.7 02/28/2017   HCT 45.1 (H) 02/28/2017   PLT 261 02/28/2017   GLUCOSE 83 02/28/2017   CHOL 239 (H) 02/28/2017   TRIG 96 02/28/2017   HDL  58 02/28/2017   LDLCALC 161 (H) 02/28/2017   ALT 19 02/28/2017   AST 18 02/28/2017   NA 135 02/28/2017   K 4.1 02/28/2017   CL 100 02/28/2017   CREATININE 0.78 02/28/2017   BUN 16 02/28/2017   CO2 23 02/28/2017   TSH 1.67 02/28/2017   HGBA1C 5.3 02/28/2017    Assessment & Plan:   Amy [name] was seen today for cough.  Diagnoses and all orders for this visit:  Acute nasopharyngitis -     fluticasone (FLONASE) 50 MCG/ACT nasal spray; Place 2 sprays into both nostrils daily. -     Chlorphen-PE-Acetaminophen  4-10-325 MG TABS; Take 1 tablet by mouth every 8 (eight) hours. -     benzonatate (TESSALON) 100 MG capsule; Take 1 capsule (100 mg total) by mouth 3 (three) times daily as needed for cough.  Generalized anxiety disorder -     buPROPion (WELLBUTRIN) 75 MG tablet; Take 1 tablet (75 mg total) by mouth 2 (two) times daily.  Class 2 obesity due to excess calories without serious comorbidity with body mass index (BMI) of 35.0 to 35.9 in adult -     Lorcaserin HCl ER (BELVIQ XR) 20 MG TB24; Take 1 tablet by mouth daily after breakfast.   I am having Amy [name] start on fluticasone, Chlorphen-PE-Acetaminophen, and benzonatate. I am also having her maintain her valACYclovir, norgestimate-ethinyl estradiol, Vitamin D (Cholecalciferol), zolpidem, buPROPion, and Lorcaserin HCl ER.  Meds ordered this encounter  Medications  . fluticasone (FLONASE) 50 MCG/ACT nasal spray    Sig: Place 2 sprays into both nostrils daily.    Dispense:  16 g    Refill:  0    Order Specific Question:   Supervising Provider    Answer:   Dianne Dun [[id number]]  . Chlorphen-PE-Acetaminophen 4-10-325 MG TABS    Sig: Take 1 tablet by mouth every 8 (eight) hours.    Dispense:  6 tablet    Refill:  0    Order Specific Question:   Supervising Provider    Answer:   Dianne Dun [[id number]]  . benzonatate (TESSALON) 100 MG capsule    Sig: Take 1 capsule (100 mg total) by mouth 3 (three) times daily as needed for cough.    Dispense:  20 capsule    Refill:  0    Order Specific Question:   Supervising Provider    Answer:   Dianne Dun [[id number]]  . buPROPion (WELLBUTRIN) 75 MG tablet    Sig: Take 1 tablet (75 mg total) by mouth 2 (two) times daily.    Dispense:  60 tablet    Refill:  0    Order Specific Question:   Supervising Provider    Answer:   Dianne Dun [[id number]]  . Lorcaserin HCl ER (BELVIQ XR) 20 MG TB24    Sig: Take 1 tablet by mouth daily after breakfast.    Dispense:  30 tablet    Refill:  0    Order Specific  Question:   Supervising Provider    Answer:   Dianne Dun [[id number]]   Follow-up: Return in about 2 weeks (around 07/08/2017) for weight loss and anxiety.  Amy Penna, NP

## 2017-06-28 ENCOUNTER — Ambulatory Visit: Payer: Self-pay | Admitting: Nurse Practitioner

## 2017-07-02 ENCOUNTER — Ambulatory Visit (INDEPENDENT_AMBULATORY_CARE_PROVIDER_SITE_OTHER): Payer: 59 | Admitting: Licensed Clinical Social Worker

## 2017-07-02 DIAGNOSIS — F419 Anxiety disorder, unspecified: Secondary | ICD-10-CM | POA: Diagnosis not present

## 2017-07-10 ENCOUNTER — Encounter: Payer: Self-pay | Admitting: Nurse Practitioner

## 2017-07-10 ENCOUNTER — Ambulatory Visit: Payer: 59 | Admitting: Nurse Practitioner

## 2017-07-10 ENCOUNTER — Other Ambulatory Visit: Payer: Self-pay | Admitting: Nurse Practitioner

## 2017-07-10 DIAGNOSIS — E6609 Other obesity due to excess calories: Secondary | ICD-10-CM

## 2017-07-10 DIAGNOSIS — F411 Generalized anxiety disorder: Secondary | ICD-10-CM | POA: Diagnosis not present

## 2017-07-10 DIAGNOSIS — Z6835 Body mass index (BMI) 35.0-35.9, adult: Principal | ICD-10-CM

## 2017-07-10 MED ORDER — BUPROPION HCL ER (XL) 150 MG PO TB24: 150.0000 mg | ORAL_TABLET | Freq: Every day | ORAL | 1 refills | Status: DC

## 2017-07-10 MED FILL — buPROPion HCL ER (XL) 150 M: 150 | 90 days supply | Qty: 90 | Fill #0

## 2017-07-10 MED FILL — ZOLPIDEM TARTRATE 5 MG TAB: 5 | 30 days supply | Qty: 30 | Fill #0

## 2017-07-10 NOTE — Progress Notes (Signed)
Subjective:  Patient ID: Amy [name], female    DOB: May 25, 1985  Age: 32 y.o. MRN: [id number]  CC: Follow-up (weight loss,anxiety/wellbutrin dosage cosult. just got back from vacation)  HPI  Obesity: Did not start belviq due to recent travel out of state. Continue small portions and healthy food choices, exercise. Wt Readings from Last 3 Encounters:  07/10/17 212 lb 12.8 oz (96.5 kg)  06/21/17 208 lb 12.8 oz (94.7 kg)  06/05/17 212 lb 12.8 oz (96.5 kg)   Anxiety: Improved mood with wellbutrin Reports forgetting second dose, hence will like to take XL tabs. Denies any adverse side effects.  Reviewed social hx.  Outpatient Medications Prior to Visit  Medication Sig Dispense Refill  . Lorcaserin HCl ER (BELVIQ XR) 20 MG TB24 Take 1 tablet by mouth daily after breakfast. 30 tablet 0  . norgestimate-ethinyl estradiol (ORTHO-CYCLEN,SPRINTEC,PREVIFEM) 0.25-35 MG-MCG tablet Take 1 tablet by mouth daily. 1 Package 11  . valACYclovir (VALTREX) 500 MG tablet Take 1 tablet (500 mg total) by mouth daily. 90 tablet 1  . Vitamin D, Cholecalciferol, 1000 units CAPS Take 1 capsule by mouth 2 (two) times daily. 60 capsule   . zolpidem (AMBIEN) 5 MG tablet TAKE 1 TABLET BY MOUTH AT BEDTIME AS NEEDED FOR SLEEP 30 tablet 0  . buPROPion (WELLBUTRIN) 75 MG tablet Take 1 tablet (75 mg total) by mouth 2 (two) times daily. 60 tablet 0  . Chlorphen-PE-Acetaminophen 4-10-325 MG TABS Take 1 tablet by mouth every 8 (eight) hours. 6 tablet 0  . benzonatate (TESSALON) 100 MG capsule Take 1 capsule (100 mg total) by mouth 3 (three) times daily as needed for cough. (Patient not taking: Reported on 07/10/2017) 20 capsule 0  . fluticasone (FLONASE) 50 MCG/ACT nasal spray Place 2 sprays into both nostrils daily. (Patient not taking: Reported on 07/10/2017) 16 g 0   No facility-administered medications prior to visit.     ROS See HPI  Objective:  BP 108/72   Pulse 84   Temp 98.4 F (36.9 C) (Oral)   Ht   (1.626 m)   Wt 212 lb 12.8 oz (96.5 kg)   SpO2 98%   BMI 36.53 kg/m   BP Readings from Last 3 Encounters:  07/10/17 108/72  06/21/17 110/74  06/05/17 110/72    Wt Readings from Last 3 Encounters:  07/10/17 212 lb 12.8 oz (96.5 kg)  06/21/17 208 lb 12.8 oz (94.7 kg)  06/05/17 212 lb 12.8 oz (96.5 kg)    Physical Exam  Constitutional: No distress.  Cardiovascular: Normal rate.  Pulmonary/Chest: Effort normal.  Musculoskeletal: She exhibits no edema.  Psychiatric: She has a normal mood and affect. Her behavior is normal. Thought content normal.  Vitals reviewed.   Lab Results  Component Value Date   WBC 9.7 02/28/2017   HGB 14.7 02/28/2017   HCT 45.1 (H) 02/28/2017   PLT 261 02/28/2017   GLUCOSE 83 02/28/2017   CHOL 239 (H) 02/28/2017   TRIG 96 02/28/2017   HDL 58 02/28/2017   LDLCALC 161 (H) 02/28/2017   ALT 19 02/28/2017   AST 18 02/28/2017   NA 135 02/28/2017   K 4.1 02/28/2017   CL 100 02/28/2017   CREATININE 0.78 02/28/2017   BUN 16 02/28/2017   CO2 23 02/28/2017   TSH 1.67 02/28/2017   HGBA1C 5.3 02/28/2017    Assessment & Plan:   Finnley was seen today for follow-up.  Diagnoses and all orders for this visit:  Generalized anxiety disorder -  buPROPion (WELLBUTRIN XL) 150 MG 24 hr tablet; Take 1 tablet (150 mg total) by mouth daily.   I have discontinued Britanie V. Orourke's fluticasone, Chlorphen-PE-Acetaminophen, benzonatate, and buPROPion. I am also having her start on buPROPion. Additionally, I am having her maintain her valACYclovir, norgestimate-ethinyl estradiol, Vitamin D (Cholecalciferol), zolpidem, and Lorcaserin HCl ER.  Meds ordered this encounter  Medications  . buPROPion (WELLBUTRIN XL) 150 MG 24 hr tablet    Sig: Take 1 tablet (150 mg total) by mouth daily.    Dispense:  90 tablet    Refill:  1    Order Specific Question:   Supervising Provider    Answer:   Dianne Dun [[id number]]    Follow-up: Return in about 7 weeks  (around 08/26/2017) for anxiety and weight loss.  Alysia Penna, NP

## 2017-07-10 NOTE — Patient Instructions (Signed)
Stop wellbutrin  BID Start wellbutrin  once a day.

## 2017-07-11 ENCOUNTER — Telehealth: Payer: Self-pay | Admitting: Nurse Practitioner

## 2017-07-11 NOTE — Telephone Encounter (Signed)
Copied from CRM [id number]. Topic: Quick Communication - Rx Refill/Question >> Jul 11, 2017  3:07 PM Amy [name] wrote: Medication: Lorcaserin HCl ER (BELVIQ XR) 20 MG TB24  Has the patient contacted their pharmacy? yes (Agent: If no, request that the patient contact the pharmacy for the refill.) (Agent: If yes, when and what did the pharmacy advise?) she contacted them yesterday they told her to take the previous prescription first   Preferred Pharmacy (with phone number or street name): [facility] - [place], [place] - [address] [place]  Agent: Please be advised that RX refills may take up to 3 business days. We ask that you follow-up with your pharmacy.

## 2017-07-11 NOTE — Telephone Encounter (Signed)
Spoke with pt regarding medication request; she states that she never picked up the original prescription because she had a flight to catch; instructed the pt to call the pharmacy back and give them this information; she verbalized understanding; will also route to office to notify them of this encounter.

## 2017-07-18 ENCOUNTER — Ambulatory Visit: Payer: Self-pay | Admitting: Licensed Clinical Social Worker

## 2017-08-14 ENCOUNTER — Ambulatory Visit: Payer: 59 | Admitting: Family Medicine

## 2017-08-14 ENCOUNTER — Encounter: Payer: Self-pay | Admitting: Family Medicine

## 2017-08-14 VITALS — BP 102/70 | HR 98 | Temp 98.2°F | Ht 64.0 in | Wt 217.0 lb

## 2017-08-14 DIAGNOSIS — M2141 Flat foot [pes planus] (acquired), right foot: Secondary | ICD-10-CM | POA: Diagnosis not present

## 2017-08-14 DIAGNOSIS — S93491A Sprain of other ligament of right ankle, initial encounter: Secondary | ICD-10-CM

## 2017-08-14 MED ORDER — MELOXICAM 15 MG PO TABS: 15.0000 mg | ORAL_TABLET | Freq: Every day | ORAL | 0 refills | Status: DC

## 2017-08-14 NOTE — Progress Notes (Signed)
Subjective:  Patient ID: Amy [name], female    DOB: 06/12/1985  Age: 32 y.o. MRN: [id number]  CC: Ankle Pain (right ankle pain--cant turn certain way--shooting pain goes up the leg/ going on 1 mo/ walk and stand alot of work/ )   HPI Amy [name] presents for evaluation of treatment of a 2-week history of right lateral ankle foot pain.  Denies any recent history of ankle or foot injury.  She does remember that she was at [facility] about 6 weeks ago and did experience a mild inversion injury to this ankle.  As far as she knows she has recovered from this.  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  She also does a lot of walking on her job and at the gym.  Her shoes are many months old.  Outpatient Medications Prior to Visit  Medication Sig Dispense Refill  . buPROPion (WELLBUTRIN XL) 150 MG 24 hr tablet Take 1 tablet (150 mg total) by mouth daily. 90 tablet 1  . Lorcaserin HCl ER (BELVIQ XR) 20 MG TB24 Take 1 tablet by mouth daily after breakfast. 30 tablet 0  . norgestimate-ethinyl estradiol (ORTHO-CYCLEN,SPRINTEC,PREVIFEM) 0.25-35 MG-MCG tablet Take 1 tablet by mouth daily. 1 Package 11  . valACYclovir (VALTREX) 500 MG tablet Take 1 tablet (500 mg total) by mouth daily. 90 tablet 1  . Vitamin D, Cholecalciferol, 1000 units CAPS Take 1 capsule by mouth 2 (two) times daily. 60 capsule   . zolpidem (AMBIEN) 5 MG tablet TAKE 1 TABLET BY MOUTH AT BEDTIME AS NEEDED FOR SLEEP 30 tablet 0   No facility-administered medications prior to visit.     ROS Review of Systems  Constitutional: Negative.   Respiratory: Negative.   Cardiovascular: Negative.   Gastrointestinal: Negative.   Musculoskeletal: Positive for arthralgias and gait problem. Negative for joint swelling.  Skin: Negative.   Neurological: Negative for weakness and numbness.  Hematological: Does not bruise/bleed easily.  Psychiatric/Behavioral: Negative.     Objective:  BP 102/70    Pulse 98   Temp 98.2 F (36.8 C) (Oral)   Ht 5\' 4"  (1.626 m)   Wt 217 lb (98.4 kg)   LMP 07/23/2017   SpO2 98%   BMI 37.25 kg/m   BP Readings from Last 3 Encounters:  08/14/17 102/70  07/10/17 108/72  06/21/17 110/74    Wt Readings from Last 3 Encounters:  08/14/17 217 lb (98.4 kg)  07/10/17 212 lb 12.8 oz (96.5 kg)  06/21/17 208 lb 12.8 oz (94.7 kg)    Physical Exam  Constitutional: She is oriented to person, place, and time. She appears well-developed and well-nourished. No distress.  HENT:  Head: Normocephalic and atraumatic.  Right Ear: External ear normal.  Left Ear: External ear normal.  Eyes: Right eye exhibits no discharge. Left eye exhibits no discharge. No scleral icterus.  Neck: No JVD present. No tracheal deviation present.  Cardiovascular:  Pulses:      Dorsalis pedis pulses are 2+ on the right side.       Posterior tibial pulses are 2+ on the right side.  Pulmonary/Chest: Breath sounds normal.  Musculoskeletal:       Right foot: There is tenderness. There is normal range of motion, no bony tenderness, no swelling and normal capillary refill.       Feet:  Neurological: She is alert and oriented to person, place, and time.  Skin: Skin is warm and dry. She is not  diaphoretic.  Psychiatric: She has a normal mood and affect. Her behavior is normal.    Lab Results  Component Value Date   WBC 9.7 02/28/2017   HGB 14.7 02/28/2017   HCT 45.1 (H) 02/28/2017   PLT 261 02/28/2017   GLUCOSE 83 02/28/2017   CHOL 239 (H) 02/28/2017   TRIG 96 02/28/2017   HDL 58 02/28/2017   LDLCALC 161 (H) 02/28/2017   ALT 19 02/28/2017   AST 18 02/28/2017   NA 135 02/28/2017   K 4.1 02/28/2017   CL 100 02/28/2017   CREATININE 0.78 02/28/2017   BUN 16 02/28/2017   CO2 23 02/28/2017   TSH 1.67 02/28/2017   HGBA1C 5.3 02/28/2017    No results found.  Assessment & Plan:   Amy [name] was seen today for ankle pain.  Diagnoses and all orders for this visit:  Sprain of  anterior talofibular ligament of right ankle, initial encounter -     meloxicam (MOBIC) 15 MG tablet; Take 1 tablet (15 mg total) by mouth daily. -     Ambulatory referral to Sports Medicine  Pes planus of right foot -     Ambulatory referral to Sports Medicine   I am having Amy [name] start on meloxicam. I am also having her maintain her valACYclovir, norgestimate-ethinyl estradiol, Vitamin D (Cholecalciferol), zolpidem, Lorcaserin HCl ER, and buPROPion.  Meds ordered this encounter  Medications  . meloxicam (MOBIC) 15 MG tablet    Sig: Take 1 tablet (15 mg total) by mouth daily.    Dispense:  30 tablet    Refill:  0   Patient was given information on ankle sprains and suggestions for rehabilitation exercises.  She was also given information on pes planus feet.  It was recommended that she change her shoes to those that might do better job of supporting her fallen arch.  Referral to sports medicine for consideration of custom inserts.  She will take the Mobic for 2 weeks and then back off to as needed.  Follow-up: No follow-ups on file.  Mliss SaxWilliam Alfred Weda Baumgarner, MD

## 2017-08-14 NOTE — Patient Instructions (Signed)
Ankle Sprain, Phase II Rehab Ask your health care provider which exercises are safe for you. Do exercises exactly as told by your health care provider and adjust them as directed. It is normal to feel mild stretching, pulling, tightness, or discomfort as you do these exercises, but you should stop right away if you feel sudden pain or your pain gets worse.Do not begin these exercises until told by your health care provider. Stretching and range of motion exercises These exercises warm up your muscles and joints and improve the movement and flexibility of your lower leg and ankle. These exercises also help to relieve pain and stiffness. Exercise A: Gastroc stretch, standing  1. Stand with your hands against a wall. 2. Extend your left / right leg behind you, and bend your front knee slightly. Your heels should be on the floor. 3. Keeping your heels on the floor and your back knee straight, shift your weight toward the wall. You should feel a gentle stretch in the back of your lower leg (calf). 4. Hold this position for __________ seconds. Repeat __________ times. Complete this exercise __________ times a day. Exercise B: Soleus stretch, standing 1. Stand with your hands against a wall. 2. Extend your left / right leg behind you, and bend your front knee slightly. Both of your heels should be on the floor. 3. Keeping your heels on the floor, bend your back knee and shift your weight slightly over your back leg. You should feel a gentle stretch deep in your calf. 4. Hold this position for __________ seconds. Repeat __________ times. Complete this exercise __________ times a day. Strengthening exercises These exercises build strength and endurance in your lower leg. Endurance is the ability to use your muscles for a long time, even after they get tired. Exercise C: Heel walking ( dorsiflexion) Walk on your heels for __________ seconds or ___________ ft. Keep your toes as high as possible. Repeat  __________ times. Complete this exercise __________ times a day. Balance exercises These exercises improve your balance and the reaction and control of your ankle to help improve stability. Exercise D: Multi-angle lunge 1. Stand with your feet together. 2. Take a step forward with your left / right leg, and shift your weight onto that leg. Your back heel will come off the floor, and your back toes will stay in place. 3. Push off your front leg to return your front foot to the starting position next to your other foot. 4. Repeat to the side, to the back, and any other directions as told by your health care provider. Repeat in each direction __________ times. Complete this exercise __________ times a day. Exercise E: Single leg stand 1. Without shoes, stand near a railing or in a door frame. Hold onto the railing or door frame as needed. 2. Stand on your left / right foot. Keep your big toe down on the floor and try to keep your arch lifted. 3. Hold this position for __________ seconds. Repeat __________ times. Complete this exercise __________ times a day. If this exercise is too easy, you can try it with your eyes closed or while standing on a pillow. Exercise F: Inversion/eversion  You will need a balance board for this exercise. Ask your health care provider where you can get a balance board or how you can make one. 1. Stand on a non-carpeted surface near a countertop or wall. 2. Step onto the balance board so your feet are hip-width apart. 3. Keep your feet  in place and keep your upper body and hips steady. Using only your feet and ankles to move the board, do one or both of the following exercises as told by your health care provider: ? Tip the board side to side as far as you can, alternating between tipping to the left and tipping to the right. If you can, tip the board so it silently taps the floor. Do not let the board forcefully hit the floor. From time to time, pause to hold a steady  position. ? Tip the board side to side so the board does not hit the floor at all. From time to time, pause to hold a steady position. Repeat the movement for each exercise __________ times. Complete each exercise __________ times a day. Exercise G: Plantar flexion/dorsiflexion  You will need a balance board for this exercise. Ask your health care provider where you can get a balance board or how you can make one. 1. Stand on a non-carpeted surface near a countertop or wall. 2. Step onto the balance board so your feet are hip-width apart. 3. Keep your feet in place and keep your upper body and hips steady. Using only your feet and ankles to move the board, do one or both of the following exercises as told by your health care provider: ? Tip the board forward and backward so the board silently taps the floor. Do not let the board forcefully hit the floor. From time to time, pause to hold a steady position. ? Tip the board forward and backward so the board does not hit the floor at all. From time to time, pause to hold a steady position. Repeat the movement for each exercise __________ times. Complete each exercise __________ times a day. This information is not intended to replace advice given to you by your health care provider. Make sure you discuss any questions you have with your health care provider. Document Released: 05/28/2005 Document Revised: 10/13/2015 Document Reviewed: 12/20/2014 Elsevier Interactive Patient Education  2018 [employer].  Flat Feet, Adult Normally, a foot has a curve, called an arch, on its inner side. The arch creates a gap between the foot and the ground. Flat feet is a common condition in which one or both feet do not have an arch. What are the causes? This condition may be caused by:  Failure of a normal arch to develop during childhood.  An injury to tendons and ligaments in the foot, such as to the tendon that supports the arch (posterior tibial  tendon).  Loose tendons or ligaments in the foot.  A wearing down of the arch over time.  Injury to bones in the foot.  An abnormality in the bones of the foot, called tarsal coalition. This happens when two or more bones in the foot are joined together (fused) before birth.  What increases the risk? This condition is more likely to develop in:  Females.  Adults age 107 or older.  People who: ? Have a family history of flat feet. ? Have a history of childhood flexible flatfoot. ? Are obese. ? Have diabetes. ? Have high blood pressure. ? Participate in high-impact sports. ? Have inflammatory arthritis. ? Have a history of broken (fractured) or dislocated bones in the foot.  What are the signs or symptoms? Symptoms of this condition include:  Pain or tightness along the bottom of the foot.  Foot pain that gets worse with activity.  Swelling of the inner side of the foot.  Swelling of the ankle.  Pain on the outer side of the ankle.  Changes in the way that you walk (gait).  Pronation. This is when the foot and ankle lean inward when you are standing.  Bony bumps on the top or inner side of the foot.  How is this diagnosed? This condition is diagnosed with a physical exam of your foot and ankle. Your health care provider may also:  Look at your shoes for patterns of wear on the soles.  Order imaging tests, such as X-rays, a CT scan, or an MRI.  Refer you to a health care provider who specializes in feet (podiatrist) or a physical therapist.  How is this treated? This condition may be treated with:  Stretching exercises or physical therapy. This helps to increase range of motion and relieve pain.  A shoe insert (orthotic). This helps to support the arch of your foot. Orthotics can be purchased from a store or can be custom-made by your health care provider.  Wearing shoes with appropriate arch support. This is especially important for athletes.  Medicines.  These may be prescribed to relieve pain.  An ankle brace, boot, or cast. These may be used to relieve pressure on your foot. You may be given crutches if walking is painful.  Surgery. This may be done to improve the alignment of your foot. This is only needed if your posterior tibial tendon is torn or if you have tarsal coalition.  Follow these instructions at home: Activity  Do any exercises as told by your health care provider.  If an activity causes pain, avoid it or try to find another activity that does not cause pain. General instructions  Wear orthotics and appropriate shoes as told by your health care provider.  Take over-the-counter and prescription medicines only as told by your health care provider.  Wear an ankle brace, boot, or cast as told by your health care provider.  Use crutches as told by your health care provider.  Keep all follow-up visits as told by your health care provider. This is important. How is this prevented? To prevent the condition from getting worse:  Wear comfortable, supportive shoes that are appropriate for your activities.  Maintain a healthy weight.  Stay active in a way that your health care provider recommends. This will help to keep your feet flexible and strong.  Manage long-term (chronic) health conditions, such as diabetes, high blood pressure, and inflammatory arthritis.  Work with a health care provider if you have concerns about your feet or shoes.  Contact a health care provider if:  You have pain in your foot or lower leg that gets worse or does not improve with medicine.  You have pain or difficulty when walking.  You have problems with your orthotics. Summary  Flat feet is a common condition in which one or both feet do not have a curve, called an arch, on the inner side.  Your health care provider may recommend a shoe insert (orthotic) or shoes with the appropriate arch support.  Other treatments may include  stretching exercises or physical therapy, medicines to relieve pain, and wearing an ankle brace, boot, or cast.  Surgery may be done if you have a tear in the tendon that supports your arch (posterior tibial tendon) or if two or more of your foot bones were joined together (fused)  before birth (tarsal coalition). This information is not intended to replace advice given to you by your health care provider. Make  sure you discuss any questions you have with your health care provider. Document Released: 12/03/2008 Document Revised: 04/18/2016 Document Reviewed: 04/18/2016 Elsevier Interactive Patient Education  [employer]2018 Elsevier Inc.

## 2017-08-27 ENCOUNTER — Encounter: Payer: Self-pay | Admitting: Family Medicine

## 2017-08-27 ENCOUNTER — Ambulatory Visit: Payer: 59 | Admitting: Family Medicine

## 2017-08-27 VITALS — BP 124/62 | HR 86 | Ht 64.0 in

## 2017-08-27 DIAGNOSIS — S93491A Sprain of other ligament of right ankle, initial encounter: Secondary | ICD-10-CM

## 2017-08-27 MED ORDER — DICLOFENAC SODIUM 2 % TD SOLN: 1.0000 "application " | Freq: Two times a day (BID) | TRANSDERMAL | 3 refills | Status: DC

## 2017-08-27 NOTE — Assessment & Plan Note (Signed)
Likely a grade 1 sprain. Has a subtalar shift on the right. - counseled on HEP  - pennsaid  - can try a lace up brace  - if no improvement consider PT, imaging.

## 2017-08-27 NOTE — Patient Instructions (Addendum)
Nice to meet you  Please try the exercises  Please try a lace up ankle brace.  Please try the medication if there is pain  Please follow up with me in 3 weeks if your pain hasn't improved.

## 2017-08-27 NOTE — Progress Notes (Signed)
Amy [name] - 32 y.o. female MRN [id number]  Date of birth: 12/06/1985  SUBJECTIVE:  Including CC & ROS.  Chief Complaint  Patient presents with  . Ankle Pain    Amy [name] is a 32 y.o. female that is presenting with right ankle pain. [place] had [place]. She states the pain has improved. She was seen on 08/14/17 and prescribed Mobic for th pain. She did not need the medication. Admits pain with plantar flexion, denies pain with dorsiflexion. She reports the pain had subsided.  Denies pain when standing or ambulating. Denies swelling and tenderness. She is a Engineer, civil (consulting)nurse at [employer]. No significant swelling or bruising when the injury first occurred. Pain is worse at the end of the day.   Review of Systems  Constitutional: Negative for fever.  HENT: Negative for congestion.   Respiratory: Negative for cough.   Cardiovascular: Negative for chest pain.  Gastrointestinal: Negative for abdominal pain.  Musculoskeletal: Negative for gait problem.  Skin: Negative for color change.  Hematological: Negative for adenopathy.  Psychiatric/Behavioral: Negative for agitation.    HISTORY: Past Medical, Surgical, Social, and Family History Reviewed & Updated per EMR.   Pertinent Historical Findings include:  Past Medical History:  Diagnosis Date  . Anxiety    with flying only  . Herpes     Past Surgical History:  Procedure Laterality Date  . CERVICAL BIOPSY  W/ LOOP ELECTRODE EXCISION      No Known Allergies  Family History  Problem Relation Age of Onset  . Hypertension Mother   . Hyperlipidemia Mother   . Hyperlipidemia Father   . Alcohol abuse Father   . Cirrhosis Father   . Hypertension Maternal Aunt   . Heart disease Paternal Grandmother      Social History   Socioeconomic History  . Marital status: Married    Spouse name: Not on file  . Number of children: Not on file  . Years of education: Not on file  . Highest education level: Not  on file  Occupational History  . Not on file  Social Needs  . Financial resource strain: Not on file  . Food insecurity:    Worry: Not on file    Inability: Not on file  . Transportation needs:    Medical: Not on file    Non-medical: Not on file  Tobacco Use  . Smoking status: Never Smoker  . Smokeless tobacco: Never Used  Substance and Sexual Activity  . Alcohol use: Yes    Comment: social  . Drug use: No  . Sexual activity: Yes    Birth control/protection: Condom  Lifestyle  . Physical activity:    Days per week: Not on file    Minutes per session: Not on file  . Stress: Not on file  Relationships  . Social connections:    Talks on phone: Not on file    Gets together: Not on file    Attends religious service: Not on file    Active member of club or organization: Not on file    Attends meetings of clubs or organizations: Not on file    Relationship status: Not on file  . Intimate partner violence:    Fear of current or ex partner: Not on file    Emotionally abused: Not on file    Physically abused: Not on file    Forced sexual activity: Not on file  Other Topics Concern  .  Not on file  Social History Narrative  . Not on file     PHYSICAL EXAM:  VS: BP 124/62 (BP Location: Left Arm, Patient Position: Sitting, Cuff Size: Normal)   Pulse 86   Ht 5\' 4"  (1.626 m)   BMI 37.25 kg/m  Physical Exam Gen: NAD, alert, cooperative with exam, well-appearing ENT: normal lips, normal nasal mucosa,  Eye: normal EOM, normal conjunctiva and lids CV:  no edema, +2 pedal pulses   Resp: no accessory muscle use, non-labored,  Skin: no rashes, no areas of induration  Neuro: normal tone, normal sensation to touch Psych:  normal insight, alert and oriented MSK:  Right ankle:  No swelling or ecchymosis. Tenderness to palpation of the ATFL. Normal ankle range of motion. Subtalar shift medially. Accessory navicular bone. No tenderness to palpation at the base of the fifth  metatarsal, medial malleolus, or navicular. Tenderness palpation of the lateral malleolus. Normal range of motion. Normal strength resistance in all range of motion.  Normal anterior drawer. Neuro vascular intact     ASSESSMENT & PLAN:   Sprain of anterior talofibular ligament of right ankle Likely a grade 1 sprain. Has a subtalar shift on the right. - counseled on HEP  - pennsaid  - can try a lace up brace  - if no improvement consider PT, imaging.

## 2017-09-10 ENCOUNTER — Other Ambulatory Visit: Payer: Self-pay | Admitting: Nurse Practitioner

## 2017-09-10 DIAGNOSIS — G4700 Insomnia, unspecified: Secondary | ICD-10-CM

## 2017-09-10 DIAGNOSIS — A6004 Herpesviral vulvovaginitis: Secondary | ICD-10-CM

## 2017-09-10 MED FILL — ZOLPIDEM TARTRATE 5 MG TAB: 5 | 30 days supply | Qty: 30 | Fill #0

## 2017-09-10 MED FILL — VALACYCLOVIR HCL 500 MG TAB: 500 | 90 days supply | Qty: 90 | Fill #0

## 2017-10-02 ENCOUNTER — Telehealth: Payer: Self-pay | Admitting: Nurse Practitioner

## 2017-10-02 NOTE — Telephone Encounter (Signed)
Spoke with the pt, she said she stop taking Wellbutrin for 2 days now because she is not sure if she is pregnant or not.   Amy [name] advise is for the pt to take the home pregnancy test and make an appt for medications option consult with her. Pt is aware.

## 2017-10-02 NOTE — Telephone Encounter (Signed)
Copied from CRM [phone]. Topic: Quick Communication - See Telephone Encounter >> Oct 02, 2017  2:48 PM Amy SarnaHayes, Amy G wrote: Pt is asking if Alysia Pennaharlotte Nche or her nurse can give her a call back.  Pt only wanting to speak with them.

## 2017-10-07 ENCOUNTER — Ambulatory Visit: Payer: 59 | Admitting: Nurse Practitioner

## 2017-10-07 ENCOUNTER — Encounter: Payer: Self-pay | Admitting: Nurse Practitioner

## 2017-10-07 VITALS — BP 110/76 | HR 84 | Temp 98.0°F | Ht 64.0 in | Wt 217.0 lb

## 2017-10-07 DIAGNOSIS — F411 Generalized anxiety disorder: Secondary | ICD-10-CM | POA: Diagnosis not present

## 2017-10-07 NOTE — Patient Instructions (Addendum)
Please continue counseling sessions with psychologist. Rip Harbourk to hold Wellbutrin at this time. F/up with GYN/OB if you become pregnant.  Continue regular exercise and heart healthy diet.   Living With Anxiety After being diagnosed with an anxiety disorder, you may be relieved to know why you have felt or behaved a certain way. It is natural to also feel overwhelmed about the treatment ahead and what it will mean for your life. With care and support, you can manage this condition and recover from it. How to cope with anxiety Dealing with stress Stress is your body's reaction to life changes and events, both good and bad. Stress can last just a few hours or it can be ongoing. Stress can play a major role in anxiety, so it is important to learn both how to cope with stress and how to think about it differently. Talk with your health care provider or a counselor to learn more about stress reduction. He or she may suggest some stress reduction techniques, such as:  Music therapy. This can include creating or listening to music that you enjoy and that inspires you.  Mindfulness-based meditation. This involves being aware of your normal breaths, rather than trying to control your breathing. It can be done while sitting or walking.  Centering prayer. This is a kind of meditation that involves focusing on a word, phrase, or sacred image that is meaningful to you and that brings you peace.  Deep breathing. To do this, expand your stomach and inhale slowly through your nose. Hold your breath for 3-5 seconds. Then exhale slowly, allowing your stomach muscles to relax.  Self-talk. This is a skill where you identify thought patterns that lead to anxiety reactions and correct those thoughts.  Muscle relaxation. This involves tensing muscles then relaxing them.  Choose a stress reduction technique that fits your lifestyle and personality. Stress reduction techniques take time and practice. Set aside 5-15  minutes a day to do them. Therapists can offer training in these techniques. The training may be covered by some insurance plans. Other things you can do to manage stress include:  Keeping a stress diary. This can help you learn what triggers your stress and ways to control your response.  Thinking about how you respond to certain situations. You may not be able to control everything, but you can control your reaction.  Making time for activities that help you relax, and not feeling guilty about spending your time in this way.  Therapy combined with coping and stress-reduction skills provides the best chance for successful treatment. Medicines Medicines can help ease symptoms. Medicines for anxiety include:  Anti-anxiety drugs.  Antidepressants.  Beta-blockers.  Medicines may be used as the main treatment for anxiety disorder, along with therapy, or if other treatments are not working. Medicines should be prescribed by a health care provider. Relationships Relationships can play a big part in helping you recover. Try to spend more time connecting with trusted friends and family members. Consider going to couples counseling, taking family education classes, or going to family therapy. Therapy can help you and others better understand the condition. How to recognize changes in your condition Everyone has a different response to treatment for anxiety. Recovery from anxiety happens when symptoms decrease and stop interfering with your daily activities at home or work. This may mean that you will start to:  Have better concentration and focus.  Sleep better.  Be less irritable.  Have more energy.  Have improved memory.  It is  important to recognize when your condition is getting worse. Contact your health care provider if your symptoms interfere with home or work and you do not feel like your condition is improving. Where to find help and support: You can get help and support from  these sources:  Self-help groups.  Online and [employer] organizations.  A trusted spiritual leader.  Couples counseling.  Family education classes.  Family therapy.  Follow these instructions at home:  Eat a healthy diet that includes plenty of vegetables, fruits, whole grains, low-fat dairy products, and lean protein. Do not eat a lot of foods that are high in solid fats, added sugars, or salt.  Exercise. Most adults should do the following: ? Exercise for at least 150 minutes each week. The exercise should increase your heart rate and make you sweat (moderate-intensity exercise). ? Strengthening exercises at least twice a week.  Cut down on caffeine, tobacco, alcohol, and other potentially harmful substances.  Get the right amount and quality of sleep. Most adults need 7-9 hours of sleep each night.  Make choices that simplify your life.  Take over-the-counter and prescription medicines only as told by your health care provider.  Avoid caffeine, alcohol, and certain over-the-counter cold medicines. These may make you feel worse. Ask your pharmacist which medicines to avoid.  Keep all follow-up visits as told by your health care provider. This is important. Questions to ask your health care provider  Would I benefit from therapy?  How often should I follow up with a health care provider?  How long do I need to take medicine?  Are there any long-term side effects of my medicine?  Are there any alternatives to taking medicine? Contact a health care provider if:  You have a hard time staying focused or finishing daily tasks.  You spend many hours a day feeling worried about everyday life.  You become exhausted by worry.  You start to have headaches, feel tense, or have nausea.  You urinate more than normal.  You have diarrhea. Get help right away if:  You have a racing heart and shortness of breath.  You have thoughts of hurting yourself or others. If you  ever feel like you may hurt yourself or others, or have thoughts about taking your own life, get help right away. You can go to your nearest emergency department or call:  Your local emergency services (911 in the [place]  A suicide crisis helpline, such as the National Suicide Prevention Lifeline at 847-116-72581-501 136 9094. This is open 24-hours a day.  Summary  Taking steps to deal with stress can help calm you.  Medicines cannot cure anxiety disorders, but they can help ease symptoms.  Family, friends, and partners can play a big part in helping you recover from an anxiety disorder. This information is not intended to replace advice given to you by your health care provider. Make sure you discuss any questions you have with your health care provider. Document Released: 01/31/2016 Document Revised: 01/31/2016 Document Reviewed: 01/31/2016 Elsevier Interactive Patient Education  [employer]2018 Elsevier Inc.

## 2017-10-07 NOTE — Progress Notes (Signed)
   Subjective:  Patient ID: Amy [name], female    DOB: [birth year]  Age: 32 y.o. MRN: [id number]  CC: Medication Problem (medication consult. pt stop taking wellbutrin and birth control. she is trying to get pregnant and needs some advise. home prenancy test was negative. Will see GYN in 2 wks. )   HPI  Amy [name] is here to discuss anxiety management while trying to get pregnant. She discontinued wellbutrin and OCP 1week ago. Home pregnancy has been negative. She reports stable mood. She no longer sees the psychologist. She will like to know if she can hold on taking any medication at this time, but resume counseling sessions?  Reviewed past Medical, Social and Family history today.  Outpatient Medications Prior to Visit  Medication Sig Dispense Refill  . valACYclovir (VALTREX) 500 MG tablet TAKE 1 TABLET BY MOUTH ONCE DAILY 90 tablet 1  . Vitamin D, Cholecalciferol, 1000 units CAPS Take 1 capsule by mouth 2 (two) times daily. 60 capsule   . zolpidem (AMBIEN) 5 MG tablet TAKE 1 TABLET BY MOUTH AT BEDTIME AS NEEDED FOR SLEEP 30 tablet 1  . buPROPion (WELLBUTRIN XL) 150 MG 24 hr tablet Take 1 tablet (150 mg total) by mouth daily. (Patient not taking: Reported on 10/07/2017) 90 tablet 1  . Diclofenac Sodium (PENNSAID) 2 % SOLN Place 1 application onto the skin 2 (two) times daily. (Patient not taking: Reported on 10/07/2017) 1 Bottle 3  . norgestimate-ethinyl estradiol (ORTHO-CYCLEN,SPRINTEC,PREVIFEM) 0.25-35 MG-MCG tablet Take 1 tablet by mouth daily. (Patient not taking: Reported on 10/07/2017) 1 Package 11   No facility-administered medications prior to visit.     ROS See HPI  Objective:  BP 110/76   Pulse 84   Temp 98 F (36.7 C) (Oral)   Ht 5\' 4"  (1.626 m)   Wt 217 lb (98.4 kg)   SpO2 98%   BMI 37.25 kg/m   BP Readings from Last 3 Encounters:  10/07/17 110/76  08/27/17 124/62  08/14/17 102/70    Wt Readings from Last 3 Encounters:  10/07/17 217 lb (98.4 kg)    08/14/17 217 lb (98.4 kg)  07/10/17 212 lb 12.8 oz (96.5 kg)    Physical Exam  Cardiovascular: Normal rate.  Pulmonary/Chest: Effort normal.  Psychiatric: She has a normal mood and affect. Her behavior is normal. Thought content normal.  Vitals reviewed.   Lab Results  Component Value Date   WBC 9.7 02/28/2017   HGB 14.7 02/28/2017   HCT 45.1 (H) 02/28/2017   PLT 261 02/28/2017   GLUCOSE 83 02/28/2017   CHOL 239 (H) 02/28/2017   TRIG 96 02/28/2017   HDL 58 02/28/2017   LDLCALC 161 (H) 02/28/2017   ALT 19 02/28/2017   AST 18 02/28/2017   NA 135 02/28/2017   K 4.1 02/28/2017   CL 100 02/28/2017   CREATININE 0.78 02/28/2017   BUN 16 02/28/2017   CO2 23 02/28/2017   TSH 1.67 02/28/2017   HGBA1C 5.3 02/28/2017    Assessment & Plan:   Amy [name] was seen today for medication problem.  Diagnoses and all orders for this visit:  Generalized anxiety disorder   I have discontinued Edilia V. Koran's norgestimate-ethinyl estradiol, buPROPion, Diclofenac Sodium, and zolpidem. I am also having her maintain her Vitamin D (Cholecalciferol) and valACYclovir.  No orders of the defined types were placed in this encounter.   Follow-up: No follow-ups on file.  Amy Penn[place]arlotte Kathryn Cosby, NP

## 2017-10-18 DIAGNOSIS — F419 Anxiety disorder, unspecified: Secondary | ICD-10-CM | POA: Diagnosis not present

## 2017-10-18 DIAGNOSIS — Z6837 Body mass index (BMI) 37.0-37.9, adult: Secondary | ICD-10-CM | POA: Diagnosis not present

## 2017-10-18 DIAGNOSIS — E669 Obesity, unspecified: Secondary | ICD-10-CM | POA: Diagnosis not present

## 2017-10-18 DIAGNOSIS — B009 Herpesviral infection, unspecified: Secondary | ICD-10-CM | POA: Diagnosis not present

## 2017-10-18 DIAGNOSIS — Z3169 Encounter for other general counseling and advice on procreation: Secondary | ICD-10-CM | POA: Diagnosis not present

## 2017-12-04 DIAGNOSIS — Z3400 Encounter for supervision of normal first pregnancy, unspecified trimester: Secondary | ICD-10-CM | POA: Diagnosis not present

## 2017-12-04 DIAGNOSIS — Z3401 Encounter for supervision of normal first pregnancy, first trimester: Secondary | ICD-10-CM | POA: Diagnosis not present

## 2017-12-04 DIAGNOSIS — Z3201 Encounter for pregnancy test, result positive: Secondary | ICD-10-CM | POA: Diagnosis not present

## 2017-12-04 DIAGNOSIS — Z3490 Encounter for supervision of normal pregnancy, unspecified, unspecified trimester: Secondary | ICD-10-CM | POA: Diagnosis not present

## 2017-12-05 LAB — OB RESULTS CONSOLE ANTIBODY SCREEN: Antibody Screen: NEGATIVE

## 2017-12-05 LAB — OB RESULTS CONSOLE GC/CHLAMYDIA
Chlamydia: NEGATIVE
Gonorrhea: NEGATIVE

## 2017-12-05 LAB — OB RESULTS CONSOLE RPR: RPR: NONREACTIVE

## 2017-12-05 LAB — OB RESULTS CONSOLE HEPATITIS B SURFACE ANTIGEN: Hepatitis B Surface Ag: NEGATIVE

## 2017-12-05 LAB — OB RESULTS CONSOLE ABO/RH: RH Type: POSITIVE

## 2017-12-05 LAB — OB RESULTS CONSOLE HIV ANTIBODY (ROUTINE TESTING): HIV: NONREACTIVE

## 2017-12-05 LAB — OB RESULTS CONSOLE RUBELLA ANTIBODY, IGM: Rubella: IMMUNE

## 2017-12-17 DIAGNOSIS — Z3401 Encounter for supervision of normal first pregnancy, first trimester: Secondary | ICD-10-CM | POA: Diagnosis not present

## 2017-12-23 DIAGNOSIS — Z3A11 11 weeks gestation of pregnancy: Secondary | ICD-10-CM | POA: Diagnosis not present

## 2017-12-25 ENCOUNTER — Ambulatory Visit: Payer: 59 | Admitting: Licensed Clinical Social Worker

## 2018-01-30 DIAGNOSIS — O3442 Maternal care for other abnormalities of cervix, second trimester: Secondary | ICD-10-CM | POA: Diagnosis not present

## 2018-01-30 DIAGNOSIS — Z3A16 16 weeks gestation of pregnancy: Secondary | ICD-10-CM | POA: Diagnosis not present

## 2018-02-26 ENCOUNTER — Ambulatory Visit (INDEPENDENT_AMBULATORY_CARE_PROVIDER_SITE_OTHER): Payer: 59 | Admitting: Licensed Clinical Social Worker

## 2018-02-26 DIAGNOSIS — F419 Anxiety disorder, unspecified: Secondary | ICD-10-CM

## 2018-02-26 DIAGNOSIS — Z3402 Encounter for supervision of normal first pregnancy, second trimester: Secondary | ICD-10-CM | POA: Diagnosis not present

## 2018-02-26 DIAGNOSIS — Z36 Encounter for antenatal screening for chromosomal anomalies: Secondary | ICD-10-CM | POA: Diagnosis not present

## 2018-03-26 ENCOUNTER — Inpatient Hospital Stay (HOSPITAL_COMMUNITY)
Admission: AD | Admit: 2018-03-26 | Discharge: 2018-03-26 | Disposition: A | Discharge status: Home / Self Care | Payer: 59 | Attending: Obstetrics & Gynecology | Admitting: Obstetrics & Gynecology

## 2018-03-26 ENCOUNTER — Encounter (HOSPITAL_COMMUNITY): Payer: Self-pay | Admitting: *Deleted

## 2018-03-26 ENCOUNTER — Ambulatory Visit: Payer: 59 | Admitting: Licensed Clinical Social Worker

## 2018-03-26 DIAGNOSIS — O26892 Other specified pregnancy related conditions, second trimester: Secondary | ICD-10-CM | POA: Diagnosis not present

## 2018-03-26 DIAGNOSIS — Z3A24 24 weeks gestation of pregnancy: Secondary | ICD-10-CM | POA: Insufficient documentation

## 2018-03-26 DIAGNOSIS — Z041 Encounter for examination and observation following transport accident: Secondary | ICD-10-CM | POA: Diagnosis not present

## 2018-03-26 HISTORY — DX: Unspecified abnormal cytological findings in specimens from vagina: R87.629
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
## 2018-04-03 ENCOUNTER — Ambulatory Visit: Payer: 59 | Admitting: Licensed Clinical Social Worker

## 2018-04-14 DIAGNOSIS — Z3402 Encounter for supervision of normal first pregnancy, second trimester: Secondary | ICD-10-CM | POA: Diagnosis not present

## 2018-04-22 DIAGNOSIS — A609 Anogenital herpesviral infection, unspecified: Secondary | ICD-10-CM | POA: Diagnosis not present

## 2018-04-22 DIAGNOSIS — Z3403 Encounter for supervision of normal first pregnancy, third trimester: Secondary | ICD-10-CM | POA: Diagnosis not present

## 2018-04-22 DIAGNOSIS — Z23 Encounter for immunization: Secondary | ICD-10-CM | POA: Diagnosis not present

## 2018-04-24 ENCOUNTER — Inpatient Hospital Stay (HOSPITAL_COMMUNITY)
Admission: AD | Admit: 2018-04-24 | Discharge: 2018-04-24 | Disposition: A | Discharge status: Home / Self Care | Payer: PRIVATE HEALTH INSURANCE | Attending: Obstetrics & Gynecology | Admitting: Obstetrics & Gynecology

## 2018-04-24 ENCOUNTER — Other Ambulatory Visit: Payer: Self-pay

## 2018-04-24 ENCOUNTER — Encounter (HOSPITAL_COMMUNITY): Payer: Self-pay | Admitting: *Deleted

## 2018-04-24 DIAGNOSIS — R109 Unspecified abdominal pain: Secondary | ICD-10-CM | POA: Diagnosis present

## 2018-04-24 DIAGNOSIS — Z3A28 28 weeks gestation of pregnancy: Secondary | ICD-10-CM | POA: Diagnosis not present

## 2018-04-24 DIAGNOSIS — M549 Dorsalgia, unspecified: Secondary | ICD-10-CM | POA: Insufficient documentation

## 2018-04-24 DIAGNOSIS — O26893 Other specified pregnancy related conditions, third trimester: Secondary | ICD-10-CM | POA: Diagnosis not present

## 2018-04-24 LAB — URINALYSIS, ROUTINE W REFLEX MICROSCOPIC
Bilirubin Urine: NEGATIVE
Glucose, UA: NEGATIVE mg/dL
HGB URINE DIPSTICK: NEGATIVE
Ketones, ur: 5 mg/dL — AB
Leukocytes,Ua: NEGATIVE
Nitrite: NEGATIVE
PH: 7 (ref 5.0–8.0)
Protein, ur: NEGATIVE mg/dL
SPECIFIC GRAVITY, URINE: 1.003 — AB (ref 1.005–1.030)

## 2018-04-24 MED ORDER — CYCLOBENZAPRINE HCL 10 MG PO TABS: 10.0000 mg | ORAL_TABLET | Freq: Two times a day (BID) | ORAL | 0 refills | Status: DC | PRN

## 2018-04-24 MED ORDER — CYCLOBENZAPRINE HCL 10 MG PO TABS
10.0000 mg | ORAL_TABLET | Freq: Once | ORAL | Status: AC
  Administered 2018-04-24: 10 mg via ORAL
  Filled 2018-04-24: qty 1

## 2018-04-24 MED FILL — CYCLOBENZAPRINE HCL 10 MG T: 10 | 7 days supply | Qty: 14 | Fill #0

## 2018-04-24 NOTE — MAU Provider Note (Signed)
History     CSN: [id number]  Arrival date and time: 04/24/18 7829   First Provider Initiated Contact with Patient 04/24/18 1016      Chief Complaint  Patient presents with  . Abdominal Pain   SHANEESE [name] is a 33 y.o. G1P0 at [redacted]w[redacted]d who presents for Back pain.  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  Patient states the patient was "thrashing around" and caused her pain and discomfort throughout there body.  She states that she she was having abdominal pain, but that has subsided and now she is having back pain.  She did not take anything for her pain and discomfort today or after the incident. She states the pain is a dull, ache and is about a 3/10 without movement and 6/10 with m/m.  She endorses fetal movement and denies cramping, abdominal pain, vaginal bleeding/discharge, or leaking of fluid.       OB History    Gravida  1   Para      Term      Preterm      AB      Living        SAB      TAB      Ectopic      Multiple      Live Births              Past Medical History:  Diagnosis Date  . Anxiety    with flying only  . Herpes   . Vaginal Pap smear, abnormal     Past Surgical History:  Procedure Laterality Date  . CERVICAL BIOPSY  W/ LOOP ELECTRODE EXCISION    . WISDOM TOOTH EXTRACTION      Family History  Problem Relation Age of Onset  . Hypertension Mother   . Hyperlipidemia Mother   . Hyperlipidemia Father   . Alcohol abuse Father   . Cirrhosis Father   . Hypertension Maternal Aunt   . Heart disease Paternal Grandmother     Social History   Tobacco Use  . Smoking status: Never Smoker  . Smokeless tobacco: Never Used  Substance Use Topics  . Alcohol use: Not Currently    Comment: social  . Drug use: No    Allergies: No Known Allergies  Medications Prior to Admission  Medication Sig Dispense Refill Last Dose  . Prenatal Vit-Fe Fumarate-FA  (MULTIVITAMIN-PRENATAL) 27-0.8 MG TABS tablet Take 1 tablet by mouth daily at 12 noon.     . valACYclovir (VALTREX) 500 MG tablet TAKE 1 TABLET BY MOUTH ONCE DAILY 90 tablet 1 Taking  . Vitamin D, Cholecalciferol, 1000 units CAPS Take 1 capsule by mouth 2 (two) times daily. 60 capsule  Taking    Review of Systems  Constitutional: Negative for chills and fever.  Respiratory: Negative for cough and shortness of breath.   Gastrointestinal: Negative for abdominal pain, diarrhea, nausea and vomiting.  Genitourinary: Negative for dysuria, vaginal bleeding and vaginal discharge.  Musculoskeletal: Positive for back pain.  Neurological: Negative for dizziness, light-headedness and headaches.   Physical Exam   Blood pressure 115/64, pulse (!) 112, temperature 98.2 F (36.8 C), temperature source Oral, resp. rate 16, last menstrual period 07/23/2017.  Physical Exam  Constitutional: She is oriented to person, place, and time. She appears well-developed and well-nourished. No distress.  HENT:  Head: Normocephalic and atraumatic.  Eyes: Conjunctivae are normal.  Neck: Normal range  of motion.  Cardiovascular: Normal rate, regular rhythm and normal heart sounds.  Respiratory: Effort normal and breath sounds normal.  GI: Soft. There is no abdominal tenderness.  Gravid--fundal height appears AGA, Soft, NT  Musculoskeletal: Normal range of motion.        General: No edema.  Neurological: She is alert and oriented to person, place, and time.  Skin: Skin is warm and dry.  Psychiatric: She has a normal mood and affect. Her behavior is normal.    Fetal Assessment 145 bpm, Mod Var, -Decels, +Accels Toco: None  MAU Course   Results for orders placed or performed during the hospital encounter of 04/24/18 (from the past 24 hour(s))  Urinalysis, Routine w reflex microscopic     Status: Abnormal   Collection Time: 04/24/18 10:26 AM  Result Value Ref Range   Color, Urine STRAW (A) YELLOW   APPearance  CLEAR CLEAR   Specific Gravity, Urine 1.003 (L) 1.005 - 1.030   pH 7.0 5.0 - 8.0   Glucose, UA NEGATIVE NEGATIVE mg/dL   Hgb urine dipstick NEGATIVE NEGATIVE   Bilirubin Urine NEGATIVE NEGATIVE   Ketones, ur 5 (A) NEGATIVE mg/dL   Protein, ur NEGATIVE NEGATIVE mg/dL   Nitrite NEGATIVE NEGATIVE   Leukocytes,Ua NEGATIVE NEGATIVE   No results found.  MDM PE Labs: UA EFM Pain Management  Assessment and Plan  33 year old G1P0 at 28.3 weeks Cat I FT Back Pain  -Exam findings discussed -Discussed restriction of physical demands at work esp when dealing with combative patients. -Patient encouraged to consider her health and the health of her child as first priority in high acuity settings. -Discussed usage of flexeril to help alleviate back pain. -Give flexeril 2 tablets now  -Will reassess  Follow Up (11:16 AM)  -NST Reactive -Patient reports improvement in back pain s/p Flexeril dosing. -Instructed to take tylenol ATC for the next 24 hours to help with pain. -Questions addressed regarding going to work tomorrow; patient instructed to use best judgement. -No other q/c. -PTL Precautions given. -Encouraged to call primary ob or return to MAU if symptoms worsen or with the onset of new symptoms. -Discharged to home in improved condition  Cherre Robins MSN, CNM 04/24/2018, 10:16 AM

## 2018-04-24 NOTE — MAU Note (Signed)
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 Pt states had pain and tightness in stomach and back. Pain now 6/10

## 2018-04-24 NOTE — Discharge Instructions (Signed)
Third Trimester of Pregnancy The third trimester is from week 28 through week 40 (months 7 through 9). The third trimester is a time when the unborn baby (fetus) is growing rapidly. At the end of the ninth month, the fetus is about 20 inches in length and weighs 6-10 pounds. Body changes during your third trimester Your body will continue to go through many changes during pregnancy. The changes vary from woman to woman. During the third trimester:  Your weight will continue to increase. You can expect to gain 25-35 pounds (11-16 kg) by the end of the pregnancy.  You may begin to get stretch marks on your hips, abdomen, and breasts.  You may urinate more often because the fetus is moving lower into your pelvis and pressing on your bladder.  You may develop or continue to have heartburn. This is caused by increased hormones that slow down muscles in the digestive tract.  You may develop or continue to have constipation because increased hormones slow digestion and cause the muscles that push waste through your intestines to relax.  You may develop hemorrhoids. These are swollen veins (varicose veins) in the rectum that can itch or be painful.  You may develop swollen, bulging veins (varicose veins) in your legs.  You may have increased body aches in the pelvis, back, or thighs. This is due to weight gain and increased hormones that are relaxing your joints.  You may have changes in your hair. These can include thickening of your hair, rapid growth, and changes in texture. Some women also have hair loss during or after pregnancy, or hair that feels dry or thin. Your hair will most likely return to normal after your baby is born.  Your breasts will continue to grow and they will continue to become tender. A yellow fluid (colostrum) may leak from your breasts. This is the first milk you are producing for your baby.  Your belly button may stick out.  You may notice more swelling in your hands,  face, or ankles.  You may have increased tingling or numbness in your hands, arms, and legs. The skin on your belly may also feel numb.  You may feel short of breath because of your expanding uterus.  You may have more problems sleeping. This can be caused by the size of your belly, increased need to urinate, and an increase in your body's metabolism.  You may notice the fetus "dropping," or moving lower in your abdomen (lightening).  You may have increased vaginal discharge.  You may notice your joints feel loose and you may have pain around your pelvic bone. What to expect at prenatal visits You will have prenatal exams every 2 weeks until week 36. Then you will have weekly prenatal exams. During a routine prenatal visit:  You will be weighed to make sure you and the baby are growing normally.  Your blood pressure will be taken.  Your abdomen will be measured to track your baby's growth.  The fetal heartbeat will be listened to.  Any test results from the previous visit will be discussed.  You may have a cervical check near your due date to see if your cervix has softened or thinned (effaced).  You will be tested for Group B streptococcus. This happens between 35 and 37 weeks. Your health care provider may ask you:  What your birth plan is.  How you are feeling.  If you are feeling the baby move.  If you have had any abnormal  symptoms, such as leaking fluid, bleeding, severe headaches, or abdominal cramping.  If you are using any tobacco products, including cigarettes, chewing tobacco, and electronic cigarettes.  If you have any questions. Other tests or screenings that may be performed during your third trimester include:  Blood tests that check for low iron levels (anemia).  Fetal testing to check the health, activity level, and growth of the fetus. Testing is done if you have certain medical conditions or if there are problems during the pregnancy.  Nonstress test  (NST). This test checks the health of your baby to make sure there are no signs of problems, such as the baby not getting enough oxygen. During this test, a belt is placed around your belly. The baby is made to move, and its heart rate is monitored during movement. What is false labor? False labor is a condition in which you feel small, irregular tightenings of the muscles in the womb (contractions) that usually go away with rest, changing position, or drinking water. These are called Braxton Hicks contractions. Contractions may last for hours, days, or even weeks before true labor sets in. If contractions come at regular intervals, become more frequent, increase in intensity, or become painful, you should see your health care provider. What are the signs of labor?  Abdominal cramps.  Regular contractions that start at 10 minutes apart and become stronger and more frequent with time.  Contractions that start on the top of the uterus and spread down to the lower abdomen and back.  Increased pelvic pressure and dull back pain.  A watery or bloody mucus discharge that comes from the vagina.  Leaking of amniotic fluid. This is also known as your "water breaking." It could be a slow trickle or a gush. Let your health care provider know if it has a color or strange odor. If you have any of these signs, call your health care provider right away, even if it is before your due date. Follow these instructions at home: Medicines  Follow your health care provider's instructions regarding medicine use. Specific medicines may be either safe or unsafe to take during pregnancy.  Take a prenatal vitamin that contains at least 600 micrograms (mcg) of folic acid.  If you develop constipation, try taking a stool softener if your health care provider approves. Eating and drinking   Eat a balanced diet that includes fresh fruits and vegetables, whole grains, good sources of protein such as meat, eggs, or tofu,  and low-fat dairy. Your health care provider will help you determine the amount of weight gain that is right for you.  Avoid raw meat and uncooked cheese. These carry germs that can cause birth defects in the baby.  If you have low calcium intake from food, talk to your health care provider about whether you should take a daily calcium supplement.  Eat four or five small meals rather than three large meals a day.  Limit foods that are high in fat and processed sugars, such as fried and sweet foods.  To prevent constipation: ? Drink enough fluid to keep your urine clear or pale yellow. ? Eat foods that are high in fiber, such as fresh fruits and vegetables, whole grains, and beans. Activity  Exercise only as directed by your health care provider. Most women can continue their usual exercise routine during pregnancy. Try to exercise for 30 minutes at least 5 days a week. Stop exercising if you experience uterine contractions.  Avoid heavy lifting.  Do  not exercise in extreme heat or humidity, or at high altitudes.  Wear low-heel, comfortable shoes.  Practice good posture.  You may continue to have sex unless your health care provider tells you otherwise. Relieving pain and discomfort  Take frequent breaks and rest with your legs elevated if you have leg cramps or low back pain.  Take warm sitz baths to soothe any pain or discomfort caused by hemorrhoids. Use hemorrhoid cream if your health care provider approves.  Wear a good support bra to prevent discomfort from breast tenderness.  If you develop varicose veins: ? Wear support pantyhose or compression stockings as told by your healthcare provider. ? Elevate your feet for 15 minutes, 3-4 times a day. Prenatal care  Write down your questions. Take them to your prenatal visits.  Keep all your prenatal visits as told by your health care provider. This is important. Safety  Wear your seat belt at all times when driving.  Make  a list of emergency phone numbers, including numbers for family, friends, the hospital, and police and fire departments. General instructions  Avoid cat litter boxes and soil used by cats. These carry germs that can cause birth defects in the baby. If you have a cat, ask someone to clean the litter box for you.  Do not travel far distances unless it is absolutely necessary and only with the approval of your health care provider.  Do not use hot tubs, steam rooms, or saunas.  Do not drink alcohol.  Do not use any products that contain nicotine or tobacco, such as cigarettes and e-cigarettes. If you need help quitting, ask your health care provider.  Do not use any medicinal herbs or unprescribed drugs. These chemicals affect the formation and growth of the baby.  Do not douche or use tampons or scented sanitary pads.  Do not cross your legs for long periods of time.  To prepare for the arrival of your baby: ? Take prenatal classes to understand, practice, and ask questions about labor and delivery. ? Make a trial run to the hospital. ? Visit the hospital and tour the maternity area. ? Arrange for maternity or paternity leave through employers. ? Arrange for family and friends to take care of pets while you are in the hospital. ? Purchase a rear-facing car seat and make sure you know how to install it in your car. ? Pack your hospital bag. ? Prepare the babys nursery. Make sure to remove all pillows and stuffed animals from the baby's crib to prevent suffocation.  Visit your dentist if you have not gone during your pregnancy. Use a soft toothbrush to brush your teeth and be gentle when you floss. Contact a health care provider if:  You are unsure if you are in labor or if your water has broken.  You become dizzy.  You have mild pelvic cramps, pelvic pressure, or nagging pain in your abdominal area.  You have lower back pain.  You have persistent nausea, vomiting, or  diarrhea.  You have an unusual or bad smelling vaginal discharge.  You have pain when you urinate. Get help right away if:  Your water breaks before 37 weeks.  You have regular contractions less than 5 minutes apart before 37 weeks.  You have a fever.  You are leaking fluid from your vagina.  You have spotting or bleeding from your vagina.  You have severe abdominal pain or cramping.  You have rapid weight loss or weight gain.  You have  shortness of breath with chest pain.  You notice sudden or extreme swelling of your face, hands, ankles, feet, or legs.  Your baby makes fewer than 10 movements in 2 hours.  You have severe headaches that do not go away when you take medicine.  You have vision changes. Summary  The third trimester is from week 28 through week 40, months 7 through 9. The third trimester is a time when the unborn baby (fetus) is growing rapidly.  During the third trimester, your discomfort may increase as you and your baby continue to gain weight. You may have abdominal, leg, and back pain, sleeping problems, and an increased need to urinate.  During the third trimester your breasts will keep growing and they will continue to become tender. A yellow fluid (colostrum) may leak from your breasts. This is the first milk you are producing for your baby.  False labor is a condition in which you feel small, irregular tightenings of the muscles in the womb (contractions) that eventually go away. These are called Braxton Hicks contractions. Contractions may last for hours, days, or even weeks before true labor sets in.  Signs of labor can include: abdominal cramps; regular contractions that start at 10 minutes apart and become stronger and more frequent with time; watery or bloody mucus discharge that comes from the vagina; increased pelvic pressure and dull back pain; and leaking of amniotic fluid. This information is not intended to replace advice given to you by your  health care provider. Make sure you discuss any questions you have with your health care provider. Document Released: 01/30/2001 Document Revised: 03/13/2016 Document Reviewed: 03/13/2016 Elsevier Interactive Patient Education  2019 [employer] Back Pain in Pregnancy Back pain during pregnancy is common. Back pain may be caused by several factors that are related to changes during your pregnancy. Follow these instructions at home: Managing pain, stiffness, and swelling      If directed, for sudden (acute) back pain, put ice on the painful area. ? Put ice in a plastic bag. ? Place a towel between your skin and the bag. ? Leave the ice on for 20 minutes, 2-3 times per day.  If directed, apply heat to the affected area before you exercise. Use the heat source that your health care provider recommends, such as a moist heat pack or a heating pad. ? Place a towel between your skin and the heat source. ? Leave the heat on for 20-30 minutes. ? Remove the heat if your skin turns bright red. This is especially important if you are unable to feel pain, heat, or cold. You may have a greater risk of getting burned.  If directed, massage the affected area. Activity  Exercise as told by your health care provider. Gentle exercise is the best way to prevent or manage back pain.  Listen to your body when lifting. If lifting hurts, ask for help or bend your knees. This uses your leg muscles instead of your back muscles.  Squat down when picking up something from the floor. Do not bend over.  Only use bed rest for short periods as told by your health care provider. Bed rest should only be used for the most severe episodes of back pain. Standing, sitting, and lying down  Do not stand in one place for long periods of time.  Use good posture when sitting. Make sure your head rests over your shoulders and is not hanging forward. Use a pillow on your lower back if necessary.  Try sleeping on your  side, preferably the left side, with a pregnancy support pillow or 1-2 regular pillows between your legs. ? If you have back pain after a night's rest, your bed may be too soft. ? A firm mattress may provide more support for your back during pregnancy. General instructions  Do not wear high heels.  Eat a healthy diet. Try to gain weight within your health care provider's recommendations.  Use a maternity girdle, elastic sling, or back brace as told by your health care provider.  Take over-the-counter and prescription medicines only as told by your health care provider.  Work with a physical therapist or massage therapist to find ways to manage back pain. Acupuncture or massage therapy may be helpful.  Keep all follow-up visits as told by your health care provider. This is important. Contact a health care provider if:  Your back pain interferes with your daily activities.  You have increasing pain in other parts of your body. Get help right away if:  You develop numbness, tingling, weakness, or problems with the use of your arms or legs.  You develop severe back pain that is not controlled with medicine.  You have a change in bowel or bladder control.  You develop shortness of breath, dizziness, or you faint.  You develop nausea, vomiting, or sweating.  You have back pain that is a rhythmic, cramping pain similar to labor pains. Labor pain is usually 1-2 minutes apart, lasts for about 1 minute, and involves a bearing down feeling or pressure in your pelvis.  You have back pain and your water breaks or you have vaginal bleeding.  You have back pain or numbness that travels down your leg.  Your back pain developed after you fell.  You develop pain on one side of your back.  You see blood in your urine.  You develop skin blisters in the area of your back pain. Summary  Back pain may be caused by several factors that are related to changes during your pregnancy.  Follow  instructions as told by your health care provider for managing pain, stiffness, and swelling.  Exercise as told by your health care provider. Gentle exercise is the best way to prevent or manage back pain.  Take over-the-counter and prescription medicines only as told by your health care provider.  Keep all follow-up visits as told by your health care provider. This is important. This information is not intended to replace advice given to you by your health care provider. Make sure you discuss any questions you have with your health care provider. Document Released: 05/16/2005 Document Revised: 07/24/2017 Document Reviewed: 07/24/2017 Elsevier Interactive Patient Education  2019 [employer].

## 2018-05-19 MED FILL — VALACYCLOVIR HCL 500 MG TAB: 500 | 90 days supply | Qty: 180 | Fill #0

## 2018-05-30 ENCOUNTER — Other Ambulatory Visit: Payer: Self-pay

## 2018-05-30 ENCOUNTER — Encounter (HOSPITAL_COMMUNITY): Payer: Self-pay | Admitting: *Deleted

## 2018-05-30 ENCOUNTER — Inpatient Hospital Stay (HOSPITAL_COMMUNITY): Payer: 59

## 2018-05-30 ENCOUNTER — Inpatient Hospital Stay (HOSPITAL_COMMUNITY)
Admission: AD | Admit: 2018-05-30 | Discharge: 2018-05-30 | Disposition: A | Discharge status: Home / Self Care | Payer: 59 | Attending: Obstetrics & Gynecology | Admitting: Obstetrics & Gynecology

## 2018-05-30 DIAGNOSIS — O2643 Herpes gestationis, third trimester: Secondary | ICD-10-CM | POA: Insufficient documentation

## 2018-05-30 DIAGNOSIS — O26853 Spotting complicating pregnancy, third trimester: Secondary | ICD-10-CM | POA: Insufficient documentation

## 2018-05-30 DIAGNOSIS — O4703 False labor before 37 completed weeks of gestation, third trimester: Secondary | ICD-10-CM | POA: Insufficient documentation

## 2018-05-30 DIAGNOSIS — N939 Abnormal uterine and vaginal bleeding, unspecified: Secondary | ICD-10-CM | POA: Insufficient documentation

## 2018-05-30 DIAGNOSIS — O468X3 Other antepartum hemorrhage, third trimester: Secondary | ICD-10-CM

## 2018-05-30 DIAGNOSIS — O479 False labor, unspecified: Secondary | ICD-10-CM | POA: Diagnosis not present

## 2018-05-30 DIAGNOSIS — Z6790 Unspecified blood type, Rh positive: Secondary | ICD-10-CM | POA: Insufficient documentation

## 2018-05-30 DIAGNOSIS — Z3A33 33 weeks gestation of pregnancy: Secondary | ICD-10-CM | POA: Diagnosis not present

## 2018-05-30 DIAGNOSIS — Z3689 Encounter for other specified antenatal screening: Secondary | ICD-10-CM | POA: Diagnosis not present

## 2018-05-30 DIAGNOSIS — Z79899 Other long term (current) drug therapy: Secondary | ICD-10-CM | POA: Insufficient documentation

## 2018-05-30 DIAGNOSIS — R109 Unspecified abdominal pain: Secondary | ICD-10-CM | POA: Diagnosis not present

## 2018-05-30 DIAGNOSIS — O26893 Other specified pregnancy related conditions, third trimester: Secondary | ICD-10-CM | POA: Insufficient documentation

## 2018-05-30 LAB — CBC WITH DIFFERENTIAL/PLATELET
Abs Immature Granulocytes: 0.13 10*3/uL — ABNORMAL HIGH (ref 0.00–0.07)
Basophils Absolute: 0 10*3/uL (ref 0.0–0.1)
Basophils Relative: 0 %
Eosinophils Absolute: 0 10*3/uL (ref 0.0–0.5)
Eosinophils Relative: 0 %
HCT: 31.4 % — ABNORMAL LOW (ref 36.0–46.0)
Hemoglobin: 10.5 g/dL — ABNORMAL LOW (ref 12.0–15.0)
Immature Granulocytes: 1 %
Lymphocytes Relative: 21 %
Lymphs Abs: 2.4 10*3/uL (ref 0.7–4.0)
MCH: 28.8 pg (ref 26.0–34.0)
MCHC: 33.4 g/dL (ref 30.0–36.0)
MCV: 86.3 fL (ref 80.0–100.0)
Monocytes Absolute: 1.1 10*3/uL — ABNORMAL HIGH (ref 0.1–1.0)
Monocytes Relative: 9 %
Neutro Abs: 7.9 10*3/uL — ABNORMAL HIGH (ref 1.7–7.7)
Neutrophils Relative %: 69 %
Platelets: 229 10*3/uL (ref 150–400)
RBC: 3.64 MIL/uL — ABNORMAL LOW (ref 3.87–5.11)
RDW: 13.8 % (ref 11.5–15.5)
WBC: 11.5 10*3/uL — ABNORMAL HIGH (ref 4.0–10.5)
nRBC: 0 % (ref 0.0–0.2)

## 2018-05-30 LAB — URINALYSIS, ROUTINE W REFLEX MICROSCOPIC
Bilirubin Urine: NEGATIVE
Glucose, UA: NEGATIVE mg/dL
Hgb urine dipstick: NEGATIVE
Ketones, ur: NEGATIVE mg/dL
Leukocytes,Ua: NEGATIVE
Nitrite: NEGATIVE
Protein, ur: NEGATIVE mg/dL
Specific Gravity, Urine: 1.005 (ref 1.005–1.030)
pH: 7 (ref 5.0–8.0)

## 2018-05-30 LAB — WET PREP, GENITAL
Clue Cells Wet Prep HPF POC: NONE SEEN
Sperm: NONE SEEN
Trich, Wet Prep: NONE SEEN
Yeast Wet Prep HPF POC: NONE SEEN

## 2018-05-30 MED ORDER — NIFEDIPINE 10 MG PO CAPS
10.0000 mg | ORAL_CAPSULE | ORAL | Status: DC | PRN
  Administered 2018-05-30 (×2): 10 mg via ORAL
  Filled 2018-05-30 (×3): qty 1

## 2018-05-30 MED ORDER — LACTATED RINGERS IV BOLUS
1000.0000 mL | Freq: Once | INTRAVENOUS | Status: AC
  Administered 2018-05-30: 19:00:00 1000 mL via INTRAVENOUS

## 2018-05-30 NOTE — Discharge Instructions (Signed)

## 2018-05-30 NOTE — MAU Provider Note (Signed)
History     CSN: [id number]  Arrival date and time: 05/30/18 1559   First Provider Initiated Contact with Patient 05/30/18 1658     Chief Complaint  Patient presents with  . Vaginal Bleeding   HPI Amy [name] is a 33 y.o. G1P0 at 7121w4d who presents to MAU with chief complaint of light vaginal bleeding x 2 episodes between 1415 and 1530 today. This is a new problem. Patient denies penetrative sexual intercourse but reports masturbating with a new sexual device Wednesday evening and last night. She denies abnormal vaginal discharge, decreased fetal movement, fever or recent illness.  Patient reports new onset abdominal pain. She thought this pain was fetal movement. She rates the pain as 6/10, does not radiate. She denies aggravating or alleviating factors. She has not taken medication or tried other treatments for this pain.   OB History    Gravida  1   Para      Term      Preterm      AB      Living        SAB      TAB      Ectopic      Multiple      Live Births              Past Medical History:  Diagnosis Date  . Anxiety    with flying only  . Herpes   . Vaginal Pap smear, abnormal     Past Surgical History:  Procedure Laterality Date  . CERVICAL BIOPSY  W/ LOOP ELECTRODE EXCISION    . WISDOM TOOTH EXTRACTION      Family History  Problem Relation Age of Onset  . Hypertension Mother   . Hyperlipidemia Mother   . Hyperlipidemia Father   . Alcohol abuse Father   . Cirrhosis Father   . Hypertension Maternal Aunt   . Heart disease Paternal Grandmother     Social History   Tobacco Use  . Smoking status: Never Smoker  . Smokeless tobacco: Never Used  Substance Use Topics  . Alcohol use: Not Currently    Comment: social  . Drug use: No    Allergies: No Known Allergies  Medications Prior to Admission  Medication Sig Dispense Refill Last Dose  . Prenatal Vit-Fe Fumarate-FA (MULTIVITAMIN-PRENATAL) 27-0.8 MG TABS tablet Take 1 tablet  by mouth daily at 12 noon.   05/29/2018 at Unknown time  . valACYclovir (VALTREX) 500 MG tablet TAKE 1 TABLET BY MOUTH ONCE DAILY (Patient taking differently: 2 (two) times daily. ) 90 tablet 1 05/30/2018 at Unknown time  . cyclobenzaprine (FLEXERIL) 10 MG tablet Take 1 tablet (10 mg total) by mouth 2 (two) times daily as needed for muscle spasms. 14 tablet 0   . Vitamin D, Cholecalciferol, 1000 units CAPS Take 1 capsule by mouth 2 (two) times daily. 60 capsule  Taking    Review of Systems  Constitutional: Negative for chills, fatigue and fever.  Gastrointestinal: Positive for abdominal pain.  Genitourinary: Positive for vaginal bleeding. Negative for difficulty urinating, dysuria and flank pain.  Musculoskeletal: Negative for back pain.  Neurological: Negative for headaches.  All other systems reviewed and are negative.  Physical Exam   Blood pressure 117/72, pulse (!) 112, temperature 98.3 F (36.8 C), temperature source Oral, resp. rate 18, weight 99.3 kg, last menstrual period 07/23/2017, SpO2 98 %.  Physical Exam  Nursing note and vitals reviewed. Constitutional: She is oriented to person, place, and time.  She appears well-developed and well-nourished.  Cardiovascular: Normal rate.  Respiratory: Effort normal.  GI: She exhibits no distension. There is no abdominal tenderness. There is no rebound and no guarding.  Gravid  Genitourinary:    Vagina normal.     No vaginal discharge.     Genitourinary Comments: No abnormal findings on SSE. Cervix visibly closed, confirmed with SVE after FFN was collected. No CMT   Neurological: She is alert and oriented to person, place, and time.  Skin: Skin is warm and dry.  Psychiatric: She has a normal mood and affect. Her behavior is normal. Judgment and thought content normal.   MAU Course/MDM  Procedures: sterile speculum exam  --Reactive tracing: baseline 135, moderate variability, positive accels, no decels --Toco: irregular mild  contractions q 2-7 minutes.  --Contractions and pain resolved with IV bolus and Procardia x 2 --FFN collected, cervix closed, FFN discarded --Prenatal records not visible in chart  Patient Vitals for the past 24 hrs:  BP Temp Temp src Pulse Resp SpO2 Weight  05/30/18 1927 112/67 98 F (36.7 C) Oral (!) 110 19 - -  05/30/18 1810 118/72 - - (!) 103 - - -  05/30/18 1625 117/72 98.3 F (36.8 C) Oral (!) 112 18 98 % 99.3 kg    Results for orders placed or performed during the hospital encounter of 05/30/18 (from the past 24 hour(s))  Urinalysis, Routine w reflex microscopic     Status: Abnormal   Collection Time: 05/30/18  4:28 PM  Result Value Ref Range   Color, Urine STRAW (A) YELLOW   APPearance CLEAR CLEAR   Specific Gravity, Urine 1.005 1.005 - 1.030   pH 7.0 5.0 - 8.0   Glucose, UA NEGATIVE NEGATIVE mg/dL   Hgb urine dipstick NEGATIVE NEGATIVE   Bilirubin Urine NEGATIVE NEGATIVE   Ketones, ur NEGATIVE NEGATIVE mg/dL   Protein, ur NEGATIVE NEGATIVE mg/dL   Nitrite NEGATIVE NEGATIVE   Leukocytes,Ua NEGATIVE NEGATIVE  CBC with Differential/Platelet     Status: Abnormal   Collection Time: 05/30/18  5:24 PM  Result Value Ref Range   WBC 11.5 (H) 4.0 - 10.5 K/uL   RBC 3.64 (L) 3.87 - 5.11 MIL/uL   Hemoglobin 10.5 (L) 12.0 - 15.0 g/dL   HCT 16.1 (L) 09.6 - 04.5 %   MCV 86.3 80.0 - 100.0 fL   MCH 28.8 26.0 - 34.0 pg   MCHC 33.4 30.0 - 36.0 g/dL   RDW 40.9 81.1 - 91.4 %   Platelets 229 150 - 400 K/uL   nRBC 0.0 0.0 - 0.2 %   Neutrophils Relative % 69 %   Neutro Abs 7.9 (H) 1.7 - 7.7 K/uL   Lymphocytes Relative 21 %   Lymphs Abs 2.4 0.7 - 4.0 K/uL   Monocytes Relative 9 %   Monocytes Absolute 1.1 (H) 0.1 - 1.0 K/uL   Eosinophils Relative 0 %   Eosinophils Absolute 0.0 0.0 - 0.5 K/uL   Basophils Relative 0 %   Basophils Absolute 0.0 0.0 - 0.1 K/uL   Immature Granulocytes 1 %   Abs Immature Granulocytes 0.13 (H) 0.00 - 0.07 K/uL  Wet prep, genital     Status: Abnormal    Collection Time: 05/30/18  5:30 PM  Result Value Ref Range   Yeast Wet Prep HPF POC NONE SEEN NONE SEEN   Trich, Wet Prep NONE SEEN NONE SEEN   Clue Cells Wet Prep HPF POC NONE SEEN NONE SEEN   WBC, Wet Prep HPF POC  MANY (A) NONE SEEN   Sperm NONE SEEN     US Ob Limited  Result Date: 05/30/2018 CLINICAL DATA:  Vaginal bleeding and pregnancy. Thirty-three weeks pregnant. EXAM: LIMITED OBSTETRIC ULTRASOUND FINDINGS: Number of Fetuses: 1 Heart Rate:  146 bpm Movement: Not described by the sonographer. Presentation: Cephalic Previa: Not demonstrated on the provided images. Placental Location: Anterior Amniotic Fluid (Subjective): Within normal limits AFI 13.4 cm IMPRESSION: 1. Single live intrauterine pregnancy. Fetal heart rate of 146 beats per minute. 2. Amniotic fluid index measurement is normal at 13.4 cm. Electronically Signed   By: Bary Richard M.D.   On: 05/30/2018 18:27        Assessment and Plan  --33 y.o. G1P0 at [redacted]w[redacted]d  --Vaginal bleeding x 2 episodes, now resolved. Blood type RH + per patient --Reactive tracing --Closed cervix --Patient denies pain at time of discharge --Discharge home in stable condition, return to MAU for worsening acuity   F/U: Next OB appt 06/03/2018  Calvert Cantor, CNM 05/30/2018, 7:44 PM

## 2018-05-30 NOTE — MAU Note (Signed)
Was at work, about 2:15.  Went to bathroom. Noted blood when wiped.  dk red, noted x2. Been a few months since she had sex. Noted blood on pantiliner when got home. Had a little light pink at very beginning of preg, nothing on [place].  No pain, +FM

## 2018-06-02 DIAGNOSIS — Z3483 Encounter for supervision of other normal pregnancy, third trimester: Secondary | ICD-10-CM | POA: Diagnosis not present

## 2018-06-02 DIAGNOSIS — Z3482 Encounter for supervision of other normal pregnancy, second trimester: Secondary | ICD-10-CM | POA: Diagnosis not present

## 2018-06-02 LAB — GC/CHLAMYDIA PROBE AMP (~~LOC~~) NOT AT ARMC
Chlamydia: NEGATIVE
Neisseria Gonorrhea: NEGATIVE

## 2018-06-03 DIAGNOSIS — Z3403 Encounter for supervision of normal first pregnancy, third trimester: Secondary | ICD-10-CM | POA: Diagnosis not present

## 2018-06-03 DIAGNOSIS — A609 Anogenital herpesviral infection, unspecified: Secondary | ICD-10-CM | POA: Diagnosis not present

## 2018-06-03 DIAGNOSIS — N9089 Other specified noninflammatory disorders of vulva and perineum: Secondary | ICD-10-CM | POA: Diagnosis not present

## 2018-06-03 DIAGNOSIS — O26843 Uterine size-date discrepancy, third trimester: Secondary | ICD-10-CM | POA: Diagnosis not present

## 2018-06-03 MED FILL — TRIAMCINOLONE 0.1% OINTMENT: 0.1 | 10 days supply | Qty: 30 | Fill #0

## 2018-06-18 DIAGNOSIS — Z3403 Encounter for supervision of normal first pregnancy, third trimester: Secondary | ICD-10-CM | POA: Diagnosis not present

## 2018-06-18 LAB — OB RESULTS CONSOLE GBS: GBS: NEGATIVE

## 2018-06-18 MED FILL — NYSTATIN 100,000 UNIT/GM CR: 100000 | 30 days supply | Qty: 15 | Fill #0

## 2018-06-23 ENCOUNTER — Telehealth: Payer: Self-pay | Admitting: Nurse Practitioner

## 2018-06-23 NOTE — Telephone Encounter (Signed)
Called and talked with patient. She states that she is pregnant and that she is following up with her GYN doctor. She states that she will follow up with Claris Gower once pregnancy is over.

## 2018-06-26 DIAGNOSIS — Z3403 Encounter for supervision of normal first pregnancy, third trimester: Secondary | ICD-10-CM | POA: Diagnosis not present

## 2018-06-26 DIAGNOSIS — N898 Other specified noninflammatory disorders of vagina: Secondary | ICD-10-CM | POA: Diagnosis not present

## 2018-06-26 DIAGNOSIS — F418 Other specified anxiety disorders: Secondary | ICD-10-CM | POA: Diagnosis not present

## 2018-06-26 MED FILL — SERTRALINE HCL 50 MG TABLET: 50 | 30 days supply | Qty: 30 | Fill #0

## 2018-06-26 MED FILL — TERCONAZOLE 0.8% VAGINAL CR: 0.8 | 3 days supply | Qty: 20 | Fill #0

## 2018-07-08 ENCOUNTER — Encounter (HOSPITAL_COMMUNITY): Payer: Self-pay | Admitting: *Deleted

## 2018-07-08 ENCOUNTER — Telehealth (HOSPITAL_COMMUNITY): Payer: Self-pay | Admitting: *Deleted

## 2018-07-08 NOTE — Telephone Encounter (Signed)
Preadmission screen  

## 2018-07-17 ENCOUNTER — Other Ambulatory Visit: Payer: Self-pay

## 2018-07-17 ENCOUNTER — Other Ambulatory Visit (HOSPITAL_COMMUNITY)
Admission: RE | Admit: 2018-07-17 | Discharge: 2018-07-17 | Disposition: A | Discharge status: Home / Self Care | Payer: 59 | Source: Ambulatory Visit | Attending: Obstetrics & Gynecology | Admitting: Obstetrics & Gynecology

## 2018-07-17 DIAGNOSIS — Z1159 Encounter for screening for other viral diseases: Secondary | ICD-10-CM | POA: Insufficient documentation

## 2018-07-18 ENCOUNTER — Other Ambulatory Visit (HOSPITAL_COMMUNITY): Payer: Self-pay | Admitting: *Deleted

## 2018-07-18 LAB — NOVEL CORONAVIRUS, NAA (HOSP ORDER, SEND-OUT TO REF LAB; TAT 18-24 HRS): SARS-CoV-2, NAA: NOT DETECTED

## 2018-07-19 ENCOUNTER — Inpatient Hospital Stay (HOSPITAL_COMMUNITY)
Admission: AD | Admit: 2018-07-19 | Discharge: 2018-07-23 | DRG: 786 | Disposition: A | Discharge status: Home / Self Care | Payer: 59 | Attending: Obstetrics & Gynecology | Admitting: Obstetrics & Gynecology

## 2018-07-19 ENCOUNTER — Encounter (HOSPITAL_COMMUNITY): Payer: Self-pay | Admitting: Nurse Practitioner

## 2018-07-19 ENCOUNTER — Other Ambulatory Visit: Payer: Self-pay

## 2018-07-19 DIAGNOSIS — O4292 Full-term premature rupture of membranes, unspecified as to length of time between rupture and onset of labor: Principal | ICD-10-CM | POA: Diagnosis present

## 2018-07-19 DIAGNOSIS — O411230 Chorioamnionitis, third trimester, not applicable or unspecified: Secondary | ICD-10-CM | POA: Diagnosis present

## 2018-07-19 DIAGNOSIS — E669 Obesity, unspecified: Secondary | ICD-10-CM | POA: Diagnosis present

## 2018-07-19 DIAGNOSIS — Z3A40 40 weeks gestation of pregnancy: Secondary | ICD-10-CM

## 2018-07-19 DIAGNOSIS — F411 Generalized anxiety disorder: Secondary | ICD-10-CM | POA: Diagnosis present

## 2018-07-19 DIAGNOSIS — O99344 Other mental disorders complicating childbirth: Secondary | ICD-10-CM | POA: Diagnosis present

## 2018-07-19 DIAGNOSIS — O9832 Other infections with a predominantly sexual mode of transmission complicating childbirth: Secondary | ICD-10-CM | POA: Diagnosis present

## 2018-07-19 DIAGNOSIS — O26893 Other specified pregnancy related conditions, third trimester: Secondary | ICD-10-CM | POA: Diagnosis present

## 2018-07-19 DIAGNOSIS — O99214 Obesity complicating childbirth: Secondary | ICD-10-CM | POA: Diagnosis present

## 2018-07-19 DIAGNOSIS — A6000 Herpesviral infection of urogenital system, unspecified: Secondary | ICD-10-CM | POA: Diagnosis present

## 2018-07-19 LAB — CBC
HCT: 33.5 % — ABNORMAL LOW (ref 36.0–46.0)
Hemoglobin: 11 g/dL — ABNORMAL LOW (ref 12.0–15.0)
MCH: 28 pg (ref 26.0–34.0)
MCHC: 32.8 g/dL (ref 30.0–36.0)
MCV: 85.2 fL (ref 80.0–100.0)
Platelets: 272 10*3/uL (ref 150–400)
RBC: 3.93 MIL/uL (ref 3.87–5.11)
RDW: 15.4 % (ref 11.5–15.5)
WBC: 11.2 10*3/uL — ABNORMAL HIGH (ref 4.0–10.5)
nRBC: 0 % (ref 0.0–0.2)

## 2018-07-19 LAB — TYPE AND SCREEN
ABO/RH(D): A POS
Antibody Screen: NEGATIVE

## 2018-07-19 LAB — POCT FERN TEST: POCT Fern Test: POSITIVE

## 2018-07-19 MED ORDER — DIPHENHYDRAMINE HCL 50 MG/ML IJ SOLN: 12.5000 mg | INTRAMUSCULAR | Status: DC | PRN

## 2018-07-19 MED ORDER — LACTATED RINGERS IV SOLN
INTRAVENOUS | Status: DC
  Administered 2018-07-20: 14:00:00 via INTRAVENOUS

## 2018-07-19 MED ORDER — PHENYLEPHRINE 40 MCG/ML (10ML) SYRINGE FOR IV PUSH (FOR BLOOD PRESSURE SUPPORT): 80.0000 ug | PREFILLED_SYRINGE | INTRAVENOUS | Status: DC | PRN

## 2018-07-19 MED ORDER — FENTANYL-BUPIVACAINE-NACL 0.5-0.125-0.9 MG/250ML-% EP SOLN
12.0000 mL/h | EPIDURAL | Status: DC | PRN
  Administered 2018-07-20: 12 mL/h via EPIDURAL
  Filled 2018-07-19 (×2): qty 250

## 2018-07-19 MED ORDER — OXYCODONE-ACETAMINOPHEN 5-325 MG PO TABS: 2.0000 | ORAL_TABLET | ORAL | Status: DC | PRN

## 2018-07-19 MED ORDER — EPHEDRINE 5 MG/ML INJ: 10.0000 mg | INTRAVENOUS | Status: DC | PRN

## 2018-07-19 MED ORDER — PHENYLEPHRINE 40 MCG/ML (10ML) SYRINGE FOR IV PUSH (FOR BLOOD PRESSURE SUPPORT)
80.0000 ug | PREFILLED_SYRINGE | INTRAVENOUS | Status: DC | PRN
  Filled 2018-07-19: qty 10

## 2018-07-19 MED ORDER — FENTANYL CITRATE (PF) 100 MCG/2ML IJ SOLN
100.0000 ug | INTRAMUSCULAR | Status: DC | PRN
  Administered 2018-07-19: 100 ug via INTRAVENOUS
  Filled 2018-07-19: qty 2

## 2018-07-19 MED ORDER — OXYCODONE-ACETAMINOPHEN 5-325 MG PO TABS: 1.0000 | ORAL_TABLET | ORAL | Status: DC | PRN

## 2018-07-19 MED ORDER — LIDOCAINE HCL (PF) 1 % IJ SOLN: 30.0000 mL | INTRAMUSCULAR | Status: DC | PRN

## 2018-07-19 MED ORDER — ACETAMINOPHEN 325 MG PO TABS
650.0000 mg | ORAL_TABLET | ORAL | Status: DC | PRN
  Administered 2018-07-20: 650 mg via ORAL
  Filled 2018-07-19: qty 2

## 2018-07-19 MED ORDER — LACTATED RINGERS IV SOLN
INTRAVENOUS | Status: DC
  Administered 2018-07-19 – 2018-07-20 (×2): via INTRAVENOUS

## 2018-07-19 MED ORDER — LACTATED RINGERS IV SOLN: 500.0000 mL | INTRAVENOUS | Status: DC | PRN

## 2018-07-19 MED ORDER — SOD CITRATE-CITRIC ACID 500-334 MG/5ML PO SOLN
30.0000 mL | ORAL | Status: DC | PRN
  Administered 2018-07-20: 30 mL via ORAL
  Filled 2018-07-19: qty 30

## 2018-07-19 MED ORDER — ONDANSETRON HCL 4 MG/2ML IJ SOLN: 4.0000 mg | Freq: Four times a day (QID) | INTRAMUSCULAR | Status: DC | PRN

## 2018-07-19 MED ORDER — OXYTOCIN 40 UNITS IN NORMAL SALINE INFUSION - SIMPLE MED
2.5000 [IU]/h | INTRAVENOUS | Status: DC
  Administered 2018-07-20: 2.5 [IU]/h via INTRAVENOUS

## 2018-07-19 MED ORDER — LACTATED RINGERS IV SOLN
500.0000 mL | Freq: Once | INTRAVENOUS | Status: AC
  Administered 2018-07-19: 500 mL via INTRAVENOUS

## 2018-07-19 MED ORDER — OXYTOCIN BOLUS FROM INFUSION: 500.0000 mL | Freq: Once | INTRAVENOUS | Status: DC

## 2018-07-19 NOTE — MAU Note (Signed)
Amy [name] is a 33 y.o. at [redacted]w[redacted]d here in MAU reporting: contractions since this morning around 7, states throughout the day they have been getting closer and been getting more painful. States they are about every 5-6 min. No bleeding, reports she is unsure about LOF- states her panties feel wet but is not wearing a pad. Unsure if it was discharge or not, states she is not sure about color. Was 1cm on tuesday  Onset of complaint: today  Pain score: 7/10  Vitals:   07/19/18 1857  BP: 130/84  Pulse: (!) 107  Resp: 16  Temp: 97.6 F (36.4 C)  SpO2: 100%     FHT:+ FM  Lab orders placed from triage: none

## 2018-07-19 NOTE — H&P (Addendum)
Amy [name] is a 33 y.o. female, Pt [facility], G1P0000, IUP at 40.5 weeks, Estimated Date of Delivery: 07/14/18 Establised: Patient's last menstrual period was 07/23/2017.  presenting for spontaneous labor. Pt endorse + Fm. Denies vaginal leakage. Denies vaginal bleeding. A pos. Antibody-. Hep B -. Rubella immune.  RPR NR. HIV Neg. HGB 11.7 in first trimester and 11.4 in the third. GBS-. Normal 20 week anatomy scan with negative genetic testing. Baby Female. Flu and TDAP UTD. Normal glucola. 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 Anxiety started zoloft 50mg  daily 2 weeks ago. H/O LEEP. GERD no meds. Obesity BMI 36. EFW 5.12 @ 34.[redacted] weeks gestation.   Patient Active Problem List   Diagnosis Date Noted  . Sprain of anterior talofibular ligament of right ankle 08/14/2017  . Pes planus of right foot 08/14/2017  . Vitamin D deficiency 03/04/2017  . Encounter for surveillance of contraceptive pills 03/04/2017  . Obesity (BMI 35.0-39.9 without comorbidity) 03/04/2017  . 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 Prediabetes 05/31/2016  . Insomnia 11/04/2015  . Generalized anxiety disorder 11/04/2015    Medications Prior to Admission  Medication Sig Dispense Refill Last Dose  . Prenatal Vit-Fe Fumarate-FA (MULTIVITAMIN-PRENATAL) 27-0.8 MG TABS tablet Take 1 tablet by mouth daily at 12 noon.   07/18/2018 at Unknown time  . valACYclovir (VALTREX) 500 MG tablet TAKE 1 TABLET BY MOUTH ONCE DAILY (Patient taking differently: 2 (two) times daily. ) 90 tablet 1 07/19/2018 at Unknown time  . cyclobenzaprine (FLEXERIL) 10 MG tablet Take 1 tablet (10 mg total) by mouth 2 (two) times daily as needed for muscle spasms. 14 tablet 0 More than a month at Unknown time  . Vitamin D, Cholecalciferol, 1000 units CAPS Take 1 capsule by mouth 2 (two) times daily. 60 capsule  Taking    Past Medical History:  Diagnosis Date  . Anxiety    with flying only  . GERD (gastroesophageal reflux disease)   . Herpes    . Vaginal Pap smear, abnormal      No current facility-administered medications on file prior to encounter.    Current Outpatient Medications on File Prior to Encounter  Medication Sig Dispense Refill  . Prenatal Vit-Fe Fumarate-FA (MULTIVITAMIN-PRENATAL) 27-0.8 MG TABS tablet Take 1 tablet by mouth daily at 12 noon.    . valACYclovir (VALTREX) 500 MG tablet TAKE 1 TABLET BY MOUTH ONCE DAILY (Patient taking differently: 2 (two) times daily. ) 90 tablet 1  . cyclobenzaprine (FLEXERIL) 10 MG tablet Take 1 tablet (10 mg total) by mouth 2 (two) times daily as needed for muscle spasms. 14 tablet 0  . Vitamin D, Cholecalciferol, 1000 units CAPS Take 1 capsule by mouth 2 (two) times daily. 60 capsule      No Known Allergies  OB History    Gravida  1   Para      Term      Preterm      AB      Living        SAB      TAB      Ectopic      Multiple      Live Births             Past Medical History:  Diagnosis Date  . Anxiety    with flying only  . GERD (gastroesophageal reflux disease)   . Herpes   . Vaginal Pap smear, abnormal    Past Surgical History:  Procedure Laterality Date  .  CERVICAL BIOPSY  W/ LOOP ELECTRODE EXCISION    . WISDOM TOOTH EXTRACTION     Family History: family history includes Alcohol abuse in her father; Cirrhosis in her father; Heart disease in her paternal grandmother; Hyperlipidemia in her father and mother; Hypertension in her maternal aunt and mother. Social History:  reports that she has never smoked. She has never used smokeless tobacco. She reports previous alcohol use. She reports that she does not use drugs.   Prenatal Transfer Tool  Maternal Diabetes: No Genetic Screening: Normal Maternal Ultrasounds/Referrals: Normal Fetal Ultrasounds or other Referrals:  None Maternal Substance Abuse:  No Significant Maternal Medications:  None Significant Maternal Lab Results: Lab values include: Group B Strep negative  ROS:  Review of  Systems  Constitutional: Negative.   HENT: Negative.   Eyes: Negative.   Respiratory: Negative.   Cardiovascular: Negative.   Gastrointestinal: Positive for abdominal pain.  Genitourinary: Negative.   Musculoskeletal: Negative.   Skin: Negative.   Neurological: Negative.   Endo/Heme/Allergies: Negative.   Psychiatric/Behavioral: Negative.      Physical Exam: BP 130/84 (BP Location: Right Arm)   Pulse (!) 107   Temp 97.6 F (36.4 C) (Oral)   Resp 16   Ht  (1.626 m)   Wt 100.5 kg   LMP 07/23/2017   SpO2 100%   BMI 38.02 kg/m   Physical Exam  Constitutional: She is oriented to person, place, and time and well-developed, well-nourished, and in no distress.  HENT:  Head: Normocephalic and atraumatic.  Eyes: Pupils are equal, round, and reactive to light. Conjunctivae are normal.  Neck: Normal range of motion. Neck supple.  Cardiovascular: Normal rate and regular rhythm.  Pulmonary/Chest: Effort normal and breath sounds normal.  Abdominal: Soft. Bowel sounds are normal.  Genitourinary:    Vagina, cervix and uterus normal.     Genitourinary Comments: Uterus gravida, soft non-tender. Pelvis adequate for vaginal delivery. SSE performed in MAU with no lesion noted.    Musculoskeletal: Normal range of motion.  Neurological: She is alert and oriented to person, place, and time.  Skin: Skin is warm and dry.  Psychiatric: Affect normal.  Nursing note and vitals reviewed.    NST: FHR baseline 135 bpm, Variability: moderate, Accelerations:present, Decelerations:  Absent= Cat 1/Reactive UC:   irregular, every 2-5 minutes, lasting 60 seconds SVE:   Dilation: 3 Effacement (%): 100 Station: -3 Exam by:: [employer] CNM, vertex verified by fetal sutures.  Leopold's: Position vertex, EFW 7lbs via leopold's.   Labs: No results found for this or any previous visit (from the past 24 hour(s)).  Imaging:  No results found.  MAU Course: No orders of the defined types  were placed in this encounter.  No orders of the defined types were placed in this encounter.   Assessment/Plan: Amy [name] is a 33 y.o. female, Pt of Dr Charlotta [facility], G1P0000, IUP at 40.5 weeks, Estimated Date of Delivery: 07/14/18 Establised: Patient's last menstrual period was 07/23/2017.  presenting for spontaneous labor. Pt endorse + Fm. Denies vaginal leakage. Denies vaginal bleeding. A pos. Antibody-. Hep B -. Rubella immune.  RPR NR. HIV Neg. HGB 11.7 in first trimester and 11.4 in the third. GBS-. Normal 20 week anatomy scan with negative genetic testing. Baby Female. Flu and TDAP UTD. Normal glucola. 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 Anxiety no meds. H/O LEEP. GERD no meds. Obesity BMI 36. EFW 5.12 @ 34.[redacted] weeks gestation.    FWB: Cat 1 Fetal Tracing.  Plan: Admit to Birthing Suite per consult with Dr Mora Appl Routine [facility] orders Pain med/epidural prn Anxiety: Zolft 50mg  HSV: Valtrex 500mg  Anticipate labor progression   Dale Tyler NP-C, CNM, MSN 07/19/2018, 9:32 PM

## 2018-07-19 NOTE — MAU Provider Note (Signed)
First Provider Initiated Contact with Patient 07/19/18 2019       S: [name] is a 33 y.o. G1P0 at [redacted]w[redacted]d  who presents to MAU today complaining of leaking of fluid since 2-3 pm. She denies vaginal bleeding. She endorses contractions. She reports normal fetal movement.  She is taking her Valtrex as prescribed and denies any recent HSV outbreak.  O: BP 130/84 (BP Location: Right Arm)   Pulse (!) 107   Temp 97.6 F (36.4 C) (Oral)   Resp 16   Ht 5\' 4"  (1.626 m)   Wt 100.5 kg   LMP 07/23/2017   SpO2 100%   BMI 38.02 kg/m  GENERAL: Well-developed, well-nourished female in no acute distress.  HEAD: Normocephalic, atraumatic.  CHEST: Normal effort of breathing, regular heart rate ABDOMEN: Soft, nontender, gravid PELVIC: Normal external female genitalia. Vagina is pink and rugated. Cervix with normal contour, no lesions. Normal discharge.  positive pooling.   No HSV lesions noted on SSE  Cervical exam:  Dilation: 3 Effacement (%): 100 Station: -3 Presentation: Vertex Exam by:: Sharen Counter CNM   Fetal Monitoring: Baseline: 135 Variability: moderate Accelerations: present Decelerations: none Contractions: Q 2-4 minutes, mild to palpation  No results found for this or any previous visit (from the past 24 hour(s)).   A: SIUP at [redacted]w[redacted]d  SROM GBS negative HSV, on valtrex no outbreaks  P: Called Dr Mora Appl, admit to L&D for PPROM, early/latent labor  Hurshel Party, CNM 07/19/2018 9:31 PM

## 2018-07-20 ENCOUNTER — Encounter (HOSPITAL_COMMUNITY)
Admission: AD | Disposition: A | Discharge status: Home / Self Care | Payer: Self-pay | Source: Home / Self Care | Attending: Obstetrics & Gynecology

## 2018-07-20 ENCOUNTER — Inpatient Hospital Stay (HOSPITAL_COMMUNITY): Payer: 59 | Admitting: Anesthesiology

## 2018-07-20 DIAGNOSIS — A6000 Herpesviral infection of urogenital system, unspecified: Secondary | ICD-10-CM | POA: Diagnosis not present

## 2018-07-20 DIAGNOSIS — E669 Obesity, unspecified: Secondary | ICD-10-CM | POA: Diagnosis not present

## 2018-07-20 DIAGNOSIS — O4292 Full-term premature rupture of membranes, unspecified as to length of time between rupture and onset of labor: Secondary | ICD-10-CM | POA: Diagnosis not present

## 2018-07-20 DIAGNOSIS — O9832 Other infections with a predominantly sexual mode of transmission complicating childbirth: Secondary | ICD-10-CM | POA: Diagnosis not present

## 2018-07-20 DIAGNOSIS — O99214 Obesity complicating childbirth: Secondary | ICD-10-CM | POA: Diagnosis not present

## 2018-07-20 DIAGNOSIS — O411230 Chorioamnionitis, third trimester, not applicable or unspecified: Secondary | ICD-10-CM | POA: Diagnosis not present

## 2018-07-20 DIAGNOSIS — F411 Generalized anxiety disorder: Secondary | ICD-10-CM | POA: Diagnosis not present

## 2018-07-20 DIAGNOSIS — O99344 Other mental disorders complicating childbirth: Secondary | ICD-10-CM | POA: Diagnosis not present

## 2018-07-20 LAB — RPR: RPR Ser Ql: NONREACTIVE

## 2018-07-20 LAB — ABO/RH: ABO/RH(D): A POS

## 2018-07-20 SURGERY — Surgical Case
Anesthesia: Epidural

## 2018-07-20 MED ORDER — PRENATAL MULTIVITAMIN CH
1.0000 | ORAL_TABLET | Freq: Every day | ORAL | Status: DC
  Administered 2018-07-21 – 2018-07-23 (×3): 1 via ORAL
  Filled 2018-07-20 (×3): qty 1

## 2018-07-20 MED ORDER — MORPHINE SULFATE (PF) 0.5 MG/ML IJ SOLN
INTRAMUSCULAR | Status: AC
  Filled 2018-07-20: qty 10

## 2018-07-20 MED ORDER — ONDANSETRON HCL 4 MG/2ML IJ SOLN
INTRAMUSCULAR | Status: AC
  Filled 2018-07-20: qty 2

## 2018-07-20 MED ORDER — ONDANSETRON HCL 4 MG/2ML IJ SOLN
INTRAMUSCULAR | Status: DC | PRN
  Administered 2018-07-20: 4 mg via INTRAVENOUS

## 2018-07-20 MED ORDER — LIDOCAINE-EPINEPHRINE (PF) 2 %-1:200000 IJ SOLN
INTRAMUSCULAR | Status: DC | PRN
  Administered 2018-07-20: 3 mL via EPIDURAL
  Administered 2018-07-20: 2 mL via EPIDURAL
  Administered 2018-07-20: 5 mL via EPIDURAL
  Administered 2018-07-20: 3 mL via EPIDURAL
  Administered 2018-07-20: 5 mL via EPIDURAL
  Administered 2018-07-20 (×2): 3 mL via EPIDURAL
  Administered 2018-07-20: 5 mL via EPIDURAL

## 2018-07-20 MED ORDER — OXYTOCIN 40 UNITS IN NORMAL SALINE INFUSION - SIMPLE MED: 2.5000 [IU]/h | INTRAVENOUS | Status: AC

## 2018-07-20 MED ORDER — CEFAZOLIN SODIUM-DEXTROSE 2-3 GM-%(50ML) IV SOLR
INTRAVENOUS | Status: DC | PRN
  Administered 2018-07-20: 2 g via INTRAVENOUS

## 2018-07-20 MED ORDER — FENTANYL CITRATE (PF) 100 MCG/2ML IJ SOLN
INTRAMUSCULAR | Status: AC
  Filled 2018-07-20: qty 2

## 2018-07-20 MED ORDER — KETOROLAC TROMETHAMINE 30 MG/ML IJ SOLN
INTRAMUSCULAR | Status: AC
  Filled 2018-07-20: qty 1

## 2018-07-20 MED ORDER — SCOPOLAMINE 1 MG/3DAYS TD PT72
MEDICATED_PATCH | TRANSDERMAL | Status: DC | PRN
  Administered 2018-07-20: 1 via TRANSDERMAL

## 2018-07-20 MED ORDER — MEPERIDINE HCL 25 MG/ML IJ SOLN
INTRAMUSCULAR | Status: DC | PRN
  Administered 2018-07-20: 25 mg via INTRAVENOUS

## 2018-07-20 MED ORDER — LIDOCAINE HCL 1 % IJ SOLN
INTRAMUSCULAR | Status: AC
  Filled 2018-07-20: qty 20

## 2018-07-20 MED ORDER — MORPHINE SULFATE (PF) 0.5 MG/ML IJ SOLN
INTRAMUSCULAR | Status: DC | PRN
  Administered 2018-07-20: 3 mg via EPIDURAL

## 2018-07-20 MED ORDER — BUPIVACAINE HCL (PF) 0.25 % IJ SOLN
INTRAMUSCULAR | Status: AC
  Filled 2018-07-20: qty 30

## 2018-07-20 MED ORDER — DEXAMETHASONE SODIUM PHOSPHATE 4 MG/ML IJ SOLN
INTRAMUSCULAR | Status: DC | PRN
  Administered 2018-07-20: 4 mg via INTRAVENOUS

## 2018-07-20 MED ORDER — KETOROLAC TROMETHAMINE 30 MG/ML IJ SOLN
30.0000 mg | Freq: Four times a day (QID) | INTRAMUSCULAR | Status: AC
  Administered 2018-07-21 – 2018-07-22 (×4): 30 mg via INTRAVENOUS
  Filled 2018-07-20 (×4): qty 1

## 2018-07-20 MED ORDER — SODIUM CHLORIDE 0.9 % IR SOLN
Status: DC | PRN
  Administered 2018-07-20: 250 mL

## 2018-07-20 MED ORDER — DIPHENHYDRAMINE HCL 25 MG PO CAPS
25.0000 mg | ORAL_CAPSULE | Freq: Four times a day (QID) | ORAL | Status: DC | PRN
  Administered 2018-07-21 (×4): 25 mg via ORAL
  Filled 2018-07-20 (×3): qty 1

## 2018-07-20 MED ORDER — SODIUM CHLORIDE (PF) 0.9 % IJ SOLN
INTRAMUSCULAR | Status: DC | PRN
  Administered 2018-07-20: 14 mL/h via EPIDURAL

## 2018-07-20 MED ORDER — TETANUS-DIPHTH-ACELL PERTUSSIS 5-2.5-18.5 LF-MCG/0.5 IM SUSP: 0.5000 mL | Freq: Once | INTRAMUSCULAR | Status: DC

## 2018-07-20 MED ORDER — SIMETHICONE 80 MG PO CHEW: 80.0000 mg | CHEWABLE_TABLET | ORAL | Status: DC | PRN

## 2018-07-20 MED ORDER — IBUPROFEN 800 MG PO TABS
800.0000 mg | ORAL_TABLET | Freq: Four times a day (QID) | ORAL | Status: DC
  Administered 2018-07-22 – 2018-07-23 (×6): 800 mg via ORAL
  Filled 2018-07-20 (×6): qty 1

## 2018-07-20 MED ORDER — FENTANYL CITRATE (PF) 100 MCG/2ML IJ SOLN
INTRAMUSCULAR | Status: DC | PRN
  Administered 2018-07-20 (×2): 50 ug via INTRAVENOUS
  Administered 2018-07-20: 100 ug via EPIDURAL

## 2018-07-20 MED ORDER — BUPIVACAINE HCL 0.25 % IJ SOLN
INTRAMUSCULAR | Status: DC | PRN
  Administered 2018-07-20: 30 mL

## 2018-07-20 MED ORDER — OXYCODONE HCL 5 MG PO TABS
5.0000 mg | ORAL_TABLET | ORAL | Status: DC | PRN
  Administered 2018-07-21 (×2): 5 mg via ORAL
  Administered 2018-07-22: 10 mg via ORAL
  Administered 2018-07-22: 5 mg via ORAL
  Administered 2018-07-22 – 2018-07-23 (×4): 10 mg via ORAL
  Filled 2018-07-20: qty 2
  Filled 2018-07-20 (×2): qty 1
  Filled 2018-07-20 (×4): qty 2
  Filled 2018-07-20: qty 1

## 2018-07-20 MED ORDER — FENTANYL CITRATE (PF) 100 MCG/2ML IJ SOLN: 50.0000 ug | INTRAMUSCULAR | Status: DC | PRN

## 2018-07-20 MED ORDER — ACETAMINOPHEN 500 MG PO TABS
ORAL_TABLET | ORAL | Status: AC
  Filled 2018-07-20: qty 2

## 2018-07-20 MED ORDER — COCONUT OIL OIL: 1.0000 "application " | TOPICAL_OIL | Status: DC | PRN

## 2018-07-20 MED ORDER — LIDOCAINE-EPINEPHRINE (PF) 2 %-1:200000 IJ SOLN
INTRAMUSCULAR | Status: AC
  Filled 2018-07-20: qty 10

## 2018-07-20 MED ORDER — PHENYLEPHRINE HCL (PRESSORS) 10 MG/ML IV SOLN
INTRAVENOUS | Status: DC | PRN
  Administered 2018-07-20: 80 ug via INTRAVENOUS

## 2018-07-20 MED ORDER — ACETAMINOPHEN 500 MG PO TABS
1000.0000 mg | ORAL_TABLET | Freq: Four times a day (QID) | ORAL | Status: DC
  Administered 2018-07-20 – 2018-07-23 (×11): 1000 mg via ORAL
  Filled 2018-07-20 (×10): qty 2

## 2018-07-20 MED ORDER — SODIUM CHLORIDE 0.9 % IR SOLN
Status: DC | PRN
  Administered 2018-07-20: 1000 mL

## 2018-07-20 MED ORDER — KETOROLAC TROMETHAMINE 30 MG/ML IJ SOLN
30.0000 mg | Freq: Once | INTRAMUSCULAR | Status: AC
  Administered 2018-07-20: 23:00:00 30 mg via INTRAVENOUS

## 2018-07-20 MED ORDER — DEXAMETHASONE SODIUM PHOSPHATE 4 MG/ML IJ SOLN
INTRAMUSCULAR | Status: AC
  Filled 2018-07-20: qty 1

## 2018-07-20 MED ORDER — SCOPOLAMINE 1 MG/3DAYS TD PT72
MEDICATED_PATCH | TRANSDERMAL | Status: AC
  Filled 2018-07-20: qty 1

## 2018-07-20 MED ORDER — LACTATED RINGERS IV SOLN
INTRAVENOUS | Status: DC | PRN
  Administered 2018-07-20 (×2): via INTRAVENOUS

## 2018-07-20 MED ORDER — MENTHOL 3 MG MT LOZG: 1.0000 | LOZENGE | OROMUCOSAL | Status: DC | PRN

## 2018-07-20 MED ORDER — SIMETHICONE 80 MG PO CHEW
80.0000 mg | CHEWABLE_TABLET | Freq: Three times a day (TID) | ORAL | Status: DC
  Administered 2018-07-21 – 2018-07-23 (×7): 80 mg via ORAL
  Filled 2018-07-20 (×7): qty 1

## 2018-07-20 MED ORDER — PHENYLEPHRINE 40 MCG/ML (10ML) SYRINGE FOR IV PUSH (FOR BLOOD PRESSURE SUPPORT)
PREFILLED_SYRINGE | INTRAVENOUS | Status: AC
  Filled 2018-07-20: qty 10

## 2018-07-20 MED ORDER — VALACYCLOVIR HCL 500 MG PO TABS
500.0000 mg | ORAL_TABLET | Freq: Two times a day (BID) | ORAL | Status: DC
  Administered 2018-07-20 – 2018-07-23 (×8): 500 mg via ORAL
  Filled 2018-07-20 (×8): qty 1

## 2018-07-20 MED ORDER — SERTRALINE HCL 50 MG PO TABS
50.0000 mg | ORAL_TABLET | Freq: Every day | ORAL | Status: DC
  Administered 2018-07-20 – 2018-07-22 (×4): 50 mg via ORAL
  Filled 2018-07-20 (×5): qty 1

## 2018-07-20 MED ORDER — TERBUTALINE SULFATE 1 MG/ML IJ SOLN: 0.2500 mg | Freq: Once | INTRAMUSCULAR | Status: DC | PRN

## 2018-07-20 MED ORDER — SODIUM CHLORIDE 0.9 % IV SOLN
INTRAVENOUS | Status: DC | PRN
  Administered 2018-07-20: 21:00:00 via INTRAVENOUS

## 2018-07-20 MED ORDER — OXYTOCIN 40 UNITS IN NORMAL SALINE INFUSION - SIMPLE MED
INTRAVENOUS | Status: AC
  Filled 2018-07-20: qty 1000

## 2018-07-20 MED ORDER — SENNOSIDES-DOCUSATE SODIUM 8.6-50 MG PO TABS
2.0000 | ORAL_TABLET | ORAL | Status: DC
  Administered 2018-07-21 – 2018-07-23 (×3): 2 via ORAL
  Filled 2018-07-20 (×3): qty 2

## 2018-07-20 MED ORDER — OXYTOCIN 40 UNITS IN NORMAL SALINE INFUSION - SIMPLE MED
1.0000 m[IU]/min | INTRAVENOUS | Status: DC
  Administered 2018-07-20: 2 m[IU]/min via INTRAVENOUS
  Filled 2018-07-20: qty 1000

## 2018-07-20 MED ORDER — SIMETHICONE 80 MG PO CHEW
80.0000 mg | CHEWABLE_TABLET | ORAL | Status: DC
  Administered 2018-07-21 – 2018-07-23 (×3): 80 mg via ORAL
  Filled 2018-07-20 (×3): qty 1

## 2018-07-20 MED ORDER — ZOLPIDEM TARTRATE 5 MG PO TABS: 5.0000 mg | ORAL_TABLET | Freq: Every evening | ORAL | Status: DC | PRN

## 2018-07-20 MED ORDER — LACTATED RINGERS IV SOLN
INTRAVENOUS | Status: DC
  Administered 2018-07-21: 08:00:00 via INTRAVENOUS

## 2018-07-20 MED ORDER — MEPERIDINE HCL 25 MG/ML IJ SOLN
INTRAMUSCULAR | Status: AC
  Filled 2018-07-20: qty 1

## 2018-07-20 SURGICAL SUPPLY — 38 items
BENZOIN TINCTURE PRP APPL 2/3 (GAUZE/BANDAGES/DRESSINGS) ×2 IMPLANT
CHLORAPREP W/TINT 26ML (MISCELLANEOUS) ×2 IMPLANT
CLAMP CORD UMBIL (MISCELLANEOUS) IMPLANT
CLOSURE STERI STRIP 1/2 X4 (GAUZE/BANDAGES/DRESSINGS) ×2 IMPLANT
CLOTH BEACON ORANGE TIMEOUT ST (SAFETY) ×2 IMPLANT
DRSG OPSITE POSTOP 4X10 (GAUZE/BANDAGES/DRESSINGS) ×2 IMPLANT
ELECT REM PT RETURN 9FT ADLT (ELECTROSURGICAL) ×2
ELECTRODE REM PT RTRN 9FT ADLT (ELECTROSURGICAL) ×1 IMPLANT
EXTRACTOR VACUUM M CUP 4 TUBE (SUCTIONS) IMPLANT
GLOVE BIO SURGEON STRL SZ7.5 (GLOVE) ×2 IMPLANT
GLOVE BIOGEL PI IND STRL 7.0 (GLOVE) ×1 IMPLANT
GLOVE BIOGEL PI IND STRL 7.5 (GLOVE) ×1 IMPLANT
GLOVE BIOGEL PI INDICATOR 7.0 (GLOVE) ×1
GLOVE BIOGEL PI INDICATOR 7.5 (GLOVE) ×1
GOWN STRL REUS W/TWL LRG LVL3 (GOWN DISPOSABLE) ×4 IMPLANT
HOVERMATT SINGLE USE (MISCELLANEOUS) ×2 IMPLANT
KIT ABG SYR 3ML LUER SLIP (SYRINGE) IMPLANT
NEEDLE HYPO 25X5/8 SAFETYGLIDE (NEEDLE) IMPLANT
NS IRRIG 1000ML POUR BTL (IV SOLUTION) ×2 IMPLANT
PACK C SECTION WH (CUSTOM PROCEDURE TRAY) ×2 IMPLANT
PAD OB MATERNITY 4.3X12.25 (PERSONAL CARE ITEMS) ×2 IMPLANT
PENCIL SMOKE EVAC W/HOLSTER (ELECTROSURGICAL) ×2 IMPLANT
RTRCTR C-SECT PINK 25CM LRG (MISCELLANEOUS) ×2 IMPLANT
STRIP CLOSURE SKIN 1/2X4 (GAUZE/BANDAGES/DRESSINGS) ×2 IMPLANT
SUT CHROMIC 2 0 CT 1 (SUTURE) ×2 IMPLANT
SUT MNCRL 0 VIOLET CTX 36 (SUTURE) ×2 IMPLANT
SUT MNCRL AB 3-0 PS2 27 (SUTURE) ×2 IMPLANT
SUT MONOCRYL 0 CTX 36 (SUTURE) ×2
SUT PLAIN 2 0 XLH (SUTURE) ×2 IMPLANT
SUT PROLENE 1 CT (SUTURE) ×2 IMPLANT
SUT VIC AB 0 CT1 36 (SUTURE) ×2 IMPLANT
SUT VIC AB 0 CTX 36 (SUTURE) ×6
SUT VIC AB 0 CTX36XBRD ANBCTRL (SUTURE) ×6 IMPLANT
SUT VIC AB 2-0 SH 27 (SUTURE) ×2
SUT VIC AB 2-0 SH 27XBRD (SUTURE) ×2 IMPLANT
TOWEL OR 17X24 6PK STRL BLUE (TOWEL DISPOSABLE) ×2 IMPLANT
TRAY FOLEY W/BAG SLVR 14FR LF (SET/KITS/TRAYS/PACK) ×2 IMPLANT
WATER STERILE IRR 1000ML POUR (IV SOLUTION) ×2 IMPLANT

## 2018-07-20 NOTE — Progress Notes (Signed)
Labor Progress Note  Amy [name] is a 33 y.o. female, Pt of [facility], G1P0000, IUP at 40.5 weeks, Estimated Date of Delivery: 07/14/18 Establised: Patient's last menstrual period was 07/23/2017.  presenting for spontaneous labor. Pt endorse + Fm. Denies vaginal leakage. Denies vaginal bleeding. A pos. Antibody-. Hep B -. Rubella immune.  RPR NR. HIV Neg. HGB 11.7 in first trimester and 11.4 in the third. GBS-. Normal 20 week anatomy scan with negative genetic testing. Baby Female. Flu and TDAP UTD. Normal glucola. 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 Anxiety started zoloft 50mg  daily 2 weeks ago. H/O LEEP. GERD no meds. Obesity BMI 36. EFW 5.12 @ 34.[redacted] weeks gestation.  Subjective: Resting in bed, SROM, clear fluids, pt stable, not feeling contraction, pt requesting epidural now.   I discussed with patient risks, benefits and alternatives of labor induction including higher risk of cesarean delivery compared to spontaneous labor.  We discussed risks of induction agents including effects on fetal heart beat, contraction pattern and need for close monitoring.  Patient expressed understanding of all this and desired to proceed with the induction. Risks and benefits of induction were reviewed, including failure of method, prolonged labor, need for further intervention, risk of cesarean.  Patient and family verbalized understanding and denies any further questions at this time. Pt and family wish to proceed with induction process. Discussed induction options of Cytotec, foley bulb, AROM, and pitocin were reviewed as well as risks and benefits with use of each discussed.  Patient Active Problem List   Diagnosis Date Noted  . Normal labor and delivery 07/19/2018  . Vitamin D deficiency 03/04/2017  . Obesity (BMI 35.0-39.9 without comorbidity) 03/04/2017  . 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 Prediabetes 05/31/2016  . Insomnia 11/04/2015  . Generalized anxiety disorder 11/04/2015    Objective: BP 125/78   Pulse 99   Temp 98 F (36.7 C) (Oral)   Resp 18   Ht 5\' 4"  (1.626 m)   Wt 100.5 kg   LMP 07/23/2017   SpO2 100%   BMI 38.03 kg/m  No intake/output data recorded. No intake/output data recorded. NST: FHR baseline 145 bpm, Variability: moderate, Accelerations:present, Decelerations:  Absent= Cat 1/Reactive CTX:  irregular, every 6-7 minutes, lasting 80 seconds Uterus gravid, soft non tender, mild to palpate with contractions.   Derfered exam for now.  SVE:  Dilation: 3 Effacement (%): 100 Station: -3 Exam by:: Misty StanleyLisa Leftwich-Kirby CNM Pitocin at (to start after epidural placement) mUn/min  Assessment:  Amy SalvoShanice V Piacente is a 33 y.o. female, Pt of Dr Charlotta [facility]zan with [facility], G1P0000, IUP at 40.5 weeks, Estimated Date of Delivery: 07/14/18 Establised: Patient's last menstrual period was 07/23/2017.  presenting for spontaneous labor. Pt endorse + Fm. Denies vaginal leakage. Denies vaginal bleeding. A pos. Antibody-. Hep B -. Rubella immune.  RPR NR. HIV Neg. HGB 11.7 in first trimester and 11.4 in the third. GBS-. Normal 20 week anatomy scan with negative genetic testing. Baby Female. Flu and TDAP UTD. Normal glucola. 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 Anxiety started zoloft 50mg  daily 2 weeks ago. H/O LEEP. GERD no meds. Obesity BMI 36. EFW 5.12 @ 34.[redacted] weeks gestation. Pt feels irregular cxt, SROM, and ok for epidural and pitocin to be started.  Patient Active Problem List   Diagnosis Date Noted  . Normal labor and delivery 07/19/2018  . Vitamin D deficiency 03/04/2017  . Obesity (BMI 35.0-39.9 without comorbidity) 03/04/2017  . 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 Prediabetes 05/31/2016  .  Insomnia 11/04/2015  . Generalized anxiety disorder 11/04/2015   NICHD: Category 1  Membranes:  SROM @ 1600 on 5/30 x 8hrs, no s/s of infection  Induction:    Pitocin - To be started  Pain management:               IV pain management: x 1 dose fentanyl              Epidural placement:  To be placed now  GBS Negative   Plan: Continue labor plan Continuous/intermittent monitoring Rest Ambulate Frequent position changes to facilitate fetal rotation and descent. Will reassess with cervical exam at 0600 or earlier if necessary Plans for epidural placement now Plan to augment with pitocin per protocol 2x2.  Anxiety: zoloft 50mg  nightly HSV: 500mg  val;trex BID.  Anticipate labor progression and vaginal delivery.   Md Pinn to be updated PRN  Dale Greeley, NP-C, CNM, MSN 07/20/2018. 12:13 AM

## 2018-07-20 NOTE — Progress Notes (Signed)
Subjective: Pt comfortable with epidural  Objective: BP 116/79 (BP Location: Right Arm)   Pulse (!) 110   Temp 98.4 F (36.9 C) (Oral)   Resp 16   Ht 5\' 4"  (1.626 m)   Wt 100.5 kg   LMP 07/23/2017   SpO2 100%   BMI 38.03 kg/m  I/O last 3 completed shifts: In: -  Out: 700 [Urine:700] Total I/O In: -  Out: 275 [Urine:275]  FHT: Category 1 FHT 110 accels, no decels,  UC:   regular, every 3 minutes SVE:   Dilation: 8 Effacement (%): 100 Station: -2 Exam by:: CNM Tailor Westfall Pitocin at 98mu   Assessment:  G1P0 at 40.6 IUP in early active labor inadequate contractions Cat 1 strip  Plan: sidelying release preformed, Continue with pitocin Monitor progress  Kenney Houseman CNM, MSN 07/20/2018, 2:26 PM

## 2018-07-20 NOTE — Progress Notes (Signed)
Subjective: Pt comfortable with epidural. RN called to evaluate contraction pattern.  Objective: BP (!) 116/92   Pulse (!) 118   Temp 97.7 F (36.5 C) (Oral)   Resp 16   Ht 5\' 4"  (1.626 m)   Wt 100.5 kg   LMP 07/23/2017   SpO2 100%   BMI 38.03 kg/m  I/O last 3 completed shifts: In: -  Out: 1725 [Urine:1725] Total I/O In: -  Out: 300 [Urine:300]  FHT: Category 1 FHT 145 accels no decels UC:   Runs of contraction every minute than a space of five minutes SVE:   Dilation: 8 Effacement (%): 100 Station: -2 Exam by:: CNM Hiral Lukasiewicz Pitocin at    Assessment:  G1P0 at 40.6 IUP in early active labor inadequate contractions Cat 1 strip  Plan: Position change to hands and needs than modified welchers position.  Will reassess.  Discussed need for possible LTCS due to no change and inability to obtain adequate contractions.  Risk and benefits discussed with patient. Will try to titrate pitocin and reevaluate in 2 hours.  If no change will proceed with LTCS.  Kenney Houseman CNM, MSN 07/20/2018, 7:38 PM

## 2018-07-20 NOTE — Progress Notes (Signed)
Subjective: Pt is comfortable with epidural.   Objective: BP 121/82 (BP Location: Right Arm)   Pulse (!) 106   Temp 98.5 F (36.9 C) (Oral)   Resp 16   Ht 5\' 4"  (1.626 m)   Wt 100.5 kg   LMP 07/23/2017   SpO2 100%   BMI 38.03 kg/m  I/O last 3 completed shifts: In: -  Out: 700 [Urine:700] Total I/O In: -  Out: 275 [Urine:275]  FHT: Category 1 FHT 140 accels no decels, UC:   irregular, every 6  minutes SVE:   Dilation: 5 Effacement (%): 100 Station: -3 Exam by:: CNM Kaylina Cahue  IUPC placed without difficulty after informed consent Assessment:  G1P0 at 40.6 IUP in early active labor inadequate contractions Cat 1 strip  Plan: Start pitocin augmentation Monitor progress  Kenney Houseman CNM, MSN 07/20/2018, 9:07 AM

## 2018-07-20 NOTE — Op Note (Signed)
Cesarean Section Procedure Note  Amy [name]  DOB:    [birth year]  MRN:    [id number]  Date of Surgery:  07/20/2018  Indication: 40 week 6 day SIUP admitted for spontaneous now being taken back to operating room for failure to progress   Pre-operative Diagnosis: Failure to Progress / Arrest of labor  Post-operative Diagnosis: same  Procedure:  Primary cesarean delivery                         Surgeon: Wynonia Hazard, MD  Assistants: Bernerd Pho, CNM  Anesthesia: Epidural anesthesia  ASA Class: 2  Procedure Details   The patient was counseled about the risks, benefits, complications of the cesarean section. The patient concurred with the proposed plan, giving informed consent.  The site of surgery properly noted/marked. The patient was taken to Operating Room # C, identified as Amy [name] and the procedure verified as C-Section Delivery. A Time Out was held and the above information confirmed.  After epidural was found to adequate , the patient was placed in the dorsal supine position with a leftward tilt, draped and prepped in the usual sterile manner. A Pfannenstiel incision was made with a 10 blade scalpel and the incision carried down through the subcutaneous tissue to the fascia.  The fascia was incised in the midline and the fascial incision was extended laterally with Mayo scissors. The superior aspect of the fascial incision was grasped with Coker clamps x2, tented up and the rectus muscles dissected off sharply with the bovie.  The rectus was then dissected off with blunt dissection and the bovie inferiorly. The rectus muscles were separated in the midline. The abdominal peritoneum was identified, and bluntly entered using surgeons fingers. The peritoneal opening was bluntly extended with gentle pulling.   The Alexis retractor was then deployed. The vesicouterine peritoneum was identified, tented up, entered sharply with Metzenbaum scissors, and the bladder flap  was created digitally. Scalpel was then used to make a low transverse incision on the uterus which was extended laterally with  blunt dissection. The fetal vertex was identified and delivered from cephalic (LOT) presentation.  A live healthy Female with Apgar scores of 3 at one minute and 8 at five minutes. After the umbilical cord was clamped and cut, the baby was handed off to waiting Pediatricians. Cord gases (venous) and cord blood was obtained for evaluation. The placenta was spontaneously removed intact. The placenta was handed off to be sent to Labor and Delivery.  The uterus was cleared of all clot and debris. The uterine incision was repaired with #0 Monocryl in running locked fashion. A second imbricating suture was performed using the same suture. There was some oozing in the middle of the incision so a third layer was placed and then the incision was hemostatic. Ovaries and tubes were inspected and normal. The Alexis retractor was removed. The abdominal cavity was cleared of all clot and debris. The abdominal peritoneum was reapproximated with 2-0 chromic  in a running fashion, the rectus muscles was reapproximated with #2 chromic in interrupted fashion. The fascia was closed with 0 Vicryl in a running fashion. The subcuticular layer was irrigated and all bleeders cauterized.  30 mL of 0.5% Marcaine was injected into the subcutaneous layer.  The Scarpas fascia was re-approximated with interrupted sutures of 2-0 plain.   The skin was closed with 4-0 vicryl in a subcuticular fashion using a Mellody Dance needle. The incision was dressed with benzoine,  steri strips and pressure dressing. All sponge lap and needle counts were correct x3.   Patient tolerated the procedure well and recovered in stable condition following the procedure.  Instrument, sponge, and needle counts were correct prior the abdominal closure and at the conclusion of the case.   Findings: Live female infant, Apgars 8/9, Meconium stained  amniotic fluid, placenta normal, 3 vessels, normal uterus, bilateral tubes and ovaries  Estimated Blood Loss:  IVF:  1700 mL LR         Drains: Foley catheter  Urine output: 200 mL clear         Specimens: Placenta to L&D          Implants: none         Complications:  None; patient tolerated the procedure well.         Disposition: PACU - hemodynamically stable.   Quinnlyn Hearns STACIA

## 2018-07-20 NOTE — Progress Notes (Signed)
Subjective: Pt comfortable.    Objective: BP (!) 116/92   Pulse (!) 118   Temp 97.7 F (36.5 C) (Oral)   Resp 16   Ht 5\' 4"  (1.626 m)   Wt 100.5 kg   LMP 07/23/2017   SpO2 100%   BMI 38.03 kg/m  I/O last 3 completed shifts: In: -  Out: 1725 [Urine:1725] Total I/O In: -  Out: 300 [Urine:300]  FHT: Category 2 FHT 145 accels, variable decels noted with return to baseline after position change UC:   regular, every 1-2 minutes SVE:   Dilation: 8 Effacement (%): 100 Station: -2 Exam by:: Harriett Sine, CNM Pitocin at off  Assessment:  G1P0 at 40.6 IUP in early active labor inadequate contractions Cat 1 strip  Plan: No change noted with cervix.  Discuss with patient to proceed with LTCS.  Pt agrees with poc.  Dr. Mora Appl called.  Kenney Houseman CNM, MSN 07/20/2018, 8:06 PM

## 2018-07-20 NOTE — Progress Notes (Signed)
Labor Progress Note  Amy [name] is a [age] y.o. female, Pt of Dr Charlotta Newtonzan with [facility], G1P0000, IUP at 40.5 weeks, Estimated Date of Delivery: 07/14/18 Establised: Patient's last menstrual period was 07/23/2017.  presenting for spontaneous labor. Pt endorse + Fm. Denies vaginal leakage. Denies vaginal bleeding. A pos. Antibody-. Hep B -. Rubella immune.  RPR NR. HIV Neg. HGB 11.7 in first trimester and 11.4 in the third. GBS-. Normal 20 week anatomy scan with negative genetic testing. Baby Female. Flu and TDAP UTD. Normal glucola. 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 Anxiety started zoloft 50mg  daily 2 weeks ago. H/O LEEP. GERD no meds. Obesity BMI 36. EFW 5.12 @ 34.[redacted] weeks gestation.  Subjective: Resting in bed, with partner at bedside, was told to me that pt had SROM and was positive fern in MAU, but pt had very strong foreback so much that I was under the impression she was still intact, reviewed r/b about AROM, pt verbalized consent.   Patient Active Problem List   Diagnosis Date Noted  . Normal labor and delivery 07/19/2018  . Vitamin D deficiency 03/04/2017  . Obesity (BMI 35.0-39.9 without comorbidity) 03/04/2017  . 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 Prediabetes 05/31/2016  . Insomnia 11/04/2015  . Generalized anxiety disorder 11/04/2015   Objective: BP 106/62   Pulse 89   Temp 98.7 F (37.1 C) (Oral)   Resp 18   Ht 5\' 4"  (1.626 m)   Wt 100.5 kg   LMP 07/23/2017   SpO2 100%   BMI 38.03 kg/m  No intake/output data recorded. Total I/O In: -  Out: 600 [Urine:600] NST: FHR baseline 145 bpm, Variability: moderate, Accelerations:present, Decelerations:  Absent= Cat 1/Reactive CTX:  irregular, every 6-7 minutes, lasting 80 seconds Uterus gravid, soft non tender, mild to palpate with contractions.   Derfered exam for now.  SVE:  Dilation: 7 Effacement (%): 100 Station: -3 Exam by:: Fama Muenchow, CNM Pitocin at (has not started yet) mUn/min  AROM/Forebag: Light meconium,  moderate amount of fluids.  Also under the impression baby is OP  Assessment:  Amy [name] is a 33 y.o. female, Pt of Dr Charlotta Newtonzan with [facility], G1P0000, IUP at 40.5 weeks, Estimated Date of Delivery: 07/14/18 Establised: Patient's last menstrual period was 07/23/2017.  presenting for spontaneous labor. Pt endorse + Fm. Denies vaginal leakage. Denies vaginal bleeding. A pos. Antibody-. Hep B -. Rubella immune.  RPR NR. HIV Neg. HGB 11.7 in first trimester and 11.4 in the third. GBS-. Normal 20 week anatomy scan with negative genetic testing. Baby Female. Flu and TDAP UTD. Normal glucola. 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 Anxiety started zoloft 50mg  daily 2 weeks ago. H/O LEEP. GERD no meds. Obesity BMI 36. EFW 5.12 @ 34.[redacted] weeks gestation. Pt feels comfortable post epidural, forbag verses AROM with mec performed.  Patient Active Problem List   Diagnosis Date Noted  . Normal labor and delivery 07/19/2018  . Vitamin D deficiency 03/04/2017  . Obesity (BMI 35.0-39.9 without comorbidity) 03/04/2017  . 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 Prediabetes 05/31/2016  . Insomnia 11/04/2015  . Generalized anxiety disorder 11/04/2015   NICHD: Category 1  Membranes:  Pt stated SROM @ 1600 on 5/30 x I am under the impression I just AROM'ed her , mec, no s/s  of infection  Induction:    Pitocin - To be started if cxt do not pick up  Pain management:               IV  pain management: x 1 dose fentanyl             Epidural placement:  Placed 0030 on 5/31  GBS Negative   Plan: Continue labor plan Continuous monitoring Rest Ambulate Frequent position changes to facilitate fetal rotation and descent. Will reassess with cervical exam if necessary or Harriett Sine will after 0700. Plan to augment with pitocin per protocol 2x2 if cxt do not pick up.  Anxiety: zoloft 50mg  nightly HSV: 500mg  val;trex BID.  Anticipate labor progression and vaginal delivery.   Md Pinn  updated  [place], NP-C, CNM,  MSN 07/20/2018. 4:45 AM

## 2018-07-20 NOTE — Anesthesia Procedure Notes (Signed)
Epidural Patient location during procedure: OB Start time: 07/20/2018 12:15 AM End time: 07/20/2018 12:30 AM  Staffing Anesthesiologist: Elmer Picker, MD Performed: anesthesiologist   Preanesthetic Checklist Completed: patient identified, pre-op evaluation, timeout performed, IV checked, risks and benefits discussed and monitors and equipment checked  Epidural Patient position: sitting Prep: site prepped and draped and DuraPrep Patient monitoring: continuous pulse ox, blood pressure, heart rate and cardiac monitor Approach: midline Location: L3-L4 Injection technique: LOR air  Needle:  Needle type: Tuohy  Needle gauge: 17 G Needle length: 9 cm Needle insertion depth: 6 cm Catheter type: closed end flexible Catheter size: 19 Gauge Catheter at skin depth: 12 cm Test dose: negative  Assessment Sensory level: T8 Events: blood not aspirated, injection not painful, no injection resistance, negative IV test and no paresthesia  Additional Notes Patient identified. Risks/Benefits/Options discussed with patient including but not limited to bleeding, infection, nerve damage, paralysis, failed block, incomplete pain control, headache, blood pressure changes, nausea, vomiting, reactions to medication both or allergic, itching and postpartum back pain. Confirmed with bedside nurse the patient's most recent platelet count. Confirmed with patient that they are not currently taking any anticoagulation, have any bleeding history or any family history of bleeding disorders. Patient expressed understanding and wished to proceed. All questions were answered. Sterile technique was used throughout the entire procedure. Please see nursing notes for vital signs. Test dose was given through epidural catheter and negative prior to continuing to dose epidural or start infusion. Warning signs of high block given to the patient including shortness of breath, tingling/numbness in hands, complete motor block,  or any concerning symptoms with instructions to call for help. Patient was given instructions on fall risk and not to get out of bed. All questions and concerns addressed with instructions to call with any issues or inadequate analgesia.  Reason for block:procedure for pain

## 2018-07-20 NOTE — Transfer of Care (Signed)
Immediate Anesthesia Transfer of Care Note  Patient: Amy [name]  Procedure(s) Performed: CESAREAN SECTION (N/A )  Patient Location: PACU  Anesthesia Type:Epidural  Level of Consciousness: awake, alert  and patient cooperative  Airway & Oxygen Therapy: Patient Spontanous Breathing  Post-op Assessment: Report given to RN and Post -op Vital signs reviewed and stable  Post vital signs: Reviewed and stable  Last Vitals:  Vitals Value Taken Time  BP 119/78 07/20/2018 10:20 PM  Temp 37.5 C 07/20/2018 10:19 PM  Pulse 103 07/20/2018 10:20 PM  Resp 24 07/20/2018 10:20 PM  SpO2 100 % 07/20/2018 10:20 PM  Vitals shown include unvalidated device data.  Last Pain:  Vitals:   07/20/18 2219  TempSrc: Oral  PainSc:          Complications: No apparent anesthesia complications

## 2018-07-20 NOTE — Anesthesia Preprocedure Evaluation (Signed)
Anesthesia Evaluation  Patient identified by MRN, date of birth, ID band Patient awake    Reviewed: Allergy & Precautions, NPO status , Patient's Chart, lab work & pertinent test results  Airway Mallampati: II  TM Distance: >3 FB Neck ROM: Full    Dental no notable dental hx.    Pulmonary neg pulmonary ROS,    Pulmonary exam normal breath sounds clear to auscultation       Cardiovascular negative cardio ROS Normal cardiovascular exam Rhythm:Regular Rate:Normal     Neuro/Psych PSYCHIATRIC DISORDERS Anxiety negative neurological ROS     GI/Hepatic Neg liver ROS, GERD  ,  Endo/Other  negative endocrine ROS  Renal/GU negative Renal ROS  negative genitourinary   Musculoskeletal negative musculoskeletal ROS (+)   Abdominal   Peds negative pediatric ROS (+)  Hematology negative hematology ROS (+)   Anesthesia Other Findings   Reproductive/Obstetrics (+) Pregnancy                             Anesthesia Physical Anesthesia Plan  ASA: II  Anesthesia Plan: Epidural   Post-op Pain Management:    Induction:   PONV Risk Score and Plan: Treatment may vary due to age or medical condition  Airway Management Planned: Natural Airway  Additional Equipment:   Intra-op Plan:   Post-operative Plan:   Informed Consent: I have reviewed the patients History and Physical, chart, labs and discussed the procedure including the risks, benefits and alternatives for the proposed anesthesia with the patient or authorized representative who has indicated his/her understanding and acceptance.       Plan Discussed with: Anesthesiologist  Anesthesia Plan Comments: (Patient identified. Risks, benefits, options discussed with patient including but not limited to bleeding, infection, nerve damage, paralysis, failed block, incomplete pain control, headache, blood pressure changes, nausea, vomiting, reactions  to medication, itching, and post partum back pain. Confirmed with bedside nurse the patient's most recent platelet count. Confirmed with the patient that they are not taking any anticoagulation, have any bleeding history or any family history of bleeding disorders. Patient expressed understanding and wishes to proceed. All questions were answered. )        Anesthesia Quick Evaluation

## 2018-07-21 ENCOUNTER — Inpatient Hospital Stay (HOSPITAL_COMMUNITY): Payer: 59

## 2018-07-21 ENCOUNTER — Encounter (HOSPITAL_COMMUNITY): Payer: Self-pay

## 2018-07-21 LAB — CBC
HCT: 27.5 % — ABNORMAL LOW (ref 36.0–46.0)
Hemoglobin: 8.8 g/dL — ABNORMAL LOW (ref 12.0–15.0)
MCH: 27.3 pg (ref 26.0–34.0)
MCHC: 32 g/dL (ref 30.0–36.0)
MCV: 85.4 fL (ref 80.0–100.0)
Platelets: 190 10*3/uL (ref 150–400)
RBC: 3.22 MIL/uL — ABNORMAL LOW (ref 3.87–5.11)
RDW: 15.3 % (ref 11.5–15.5)
WBC: 15 10*3/uL — ABNORMAL HIGH (ref 4.0–10.5)
nRBC: 0 % (ref 0.0–0.2)

## 2018-07-21 MED ORDER — DIBUCAINE (PERIANAL) 1 % EX OINT: 1.0000 "application " | TOPICAL_OINTMENT | CUTANEOUS | Status: DC | PRN

## 2018-07-21 MED ORDER — SODIUM CHLORIDE 0.9 % IV SOLN
3.0000 g | Freq: Once | INTRAVENOUS | Status: AC
  Administered 2018-07-21: 3 g via INTRAVENOUS
  Filled 2018-07-21: qty 3

## 2018-07-21 MED ORDER — LACTATED RINGERS IV BOLUS
500.0000 mL | Freq: Once | INTRAVENOUS | Status: AC
  Administered 2018-07-21: 500 mL via INTRAVENOUS

## 2018-07-21 MED ORDER — KETOROLAC TROMETHAMINE 30 MG/ML IJ SOLN: 30.0000 mg | Freq: Four times a day (QID) | INTRAMUSCULAR | Status: AC | PRN

## 2018-07-21 MED ORDER — NALBUPHINE HCL 10 MG/ML IJ SOLN: 5.0000 mg | Freq: Once | INTRAMUSCULAR | Status: DC | PRN

## 2018-07-21 MED ORDER — DIPHENHYDRAMINE HCL 50 MG/ML IJ SOLN: 12.5000 mg | INTRAMUSCULAR | Status: DC | PRN

## 2018-07-21 MED ORDER — SODIUM CHLORIDE 0.9% FLUSH
3.0000 mL | INTRAVENOUS | Status: DC | PRN
  Administered 2018-07-22: 3 mL via INTRAVENOUS
  Filled 2018-07-21: qty 3

## 2018-07-21 MED ORDER — NALBUPHINE HCL 10 MG/ML IJ SOLN: 5.0000 mg | INTRAMUSCULAR | Status: DC | PRN

## 2018-07-21 MED ORDER — CEFAZOLIN SODIUM-DEXTROSE 2-4 GM/100ML-% IV SOLN
INTRAVENOUS | Status: AC
  Filled 2018-07-21: qty 100

## 2018-07-21 MED ORDER — NALOXONE HCL 0.4 MG/ML IJ SOLN: 0.4000 mg | INTRAMUSCULAR | Status: DC | PRN

## 2018-07-21 MED ORDER — DIPHENHYDRAMINE HCL 25 MG PO CAPS
25.0000 mg | ORAL_CAPSULE | ORAL | Status: DC | PRN
  Filled 2018-07-21: qty 1

## 2018-07-21 MED ORDER — ONDANSETRON HCL 4 MG/2ML IJ SOLN: 4.0000 mg | Freq: Three times a day (TID) | INTRAMUSCULAR | Status: DC | PRN

## 2018-07-21 MED ORDER — WITCH HAZEL-GLYCERIN EX PADS: 1.0000 "application " | MEDICATED_PAD | CUTANEOUS | Status: DC | PRN

## 2018-07-21 NOTE — Progress Notes (Signed)
MOB was referred for history of depression/anxiety. * Referral screened out by Clinical Social Worker because none of the following criteria appear to apply: ~ History of anxiety/depression during this pregnancy, or of post-partum depression following prior delivery. ~ Diagnosis of anxiety and/or depression within last 3 years OR * MOB's symptoms currently being treated with medication and/or therapy. Per further chart review, MOB started on Zoloft 50 mg two weeks ago and appears to still be active at this time.    Please contact the Clinical Social Worker if needs arise, by [facility] request, or if MOB scores greater than 9/yes to question 10 on [place] Postpartum Depression Screen.      Claude Manges Tallula Grindle, MSW, LCSW-A [facility] [place] [phone]

## 2018-07-21 NOTE — Progress Notes (Signed)
Postpartum Note Day # 1  S:  Patient resting comfortable in bed.  Pain controlled, just reporting "all over" itching.  Tolerating clears. No flatus, no BM.  Lochia moderate.  She has ambulated x 1 to the bathroom to freshen up, foley still in place.  She denies n/v/f/c, SOB, or CP.  Pt plans on breastfeeding.  O: Temp:  [97.7 F (36.5 C)-100.9 F (38.3 C)] 99.1 F (37.3 C) (06/01 0445) Pulse Rate:  [87-122] 121 (06/01 0445) Resp:  [14-24] 18 (06/01 0445) BP: (98-130)/(49-92) 125/68 (06/01 0445) SpO2:  [97 %-100 %] 98 % (06/01 0445)  Tmax: 100.9  Gen: A&Ox3, NAD CV: +tachycardia Resp: Normal respiratory rate and effort Abdomen: soft, NT, ND Uterus: firm, non-tender, below umbilicus Incision: c/d/i, bandage on Ext: No edema, no calf tenderness bilaterally, SCDs in place  Labs:  Recent Labs    07/19/18 2142 07/21/18 0516  HGB 11.0* 8.8*    A/P: Pt is a 33 y.o. G1P1001 s/p primary C-section, POD#1 -Suspected chorioamnionitis-  Temp noted after delivery, with low grade temp currently despite tylenol, plan for Unasyn IV x 1, will continue to closely monitor  - Pain well controlled -Pruritis- reassured pt that this is side effect of the medication, encouraged benadryl as needed -GU: UOP is adequate, plan to remove foley this am -GI: Advance diet as tolerated -Activity: encouraged sitting up to chair and ambulation as tolerated -Prophylaxis: early ambulation -Labs: appropriate decline for recent surgery, pt asymptomatic -Plan for baby boy circ to be completed later today  DISPO: Continue with routine postpartum care  Myna Hidalgo, DO [phone] (cell) [phone] (office)

## 2018-07-21 NOTE — Lactation Note (Signed)
This note was copied from a baby's chart. Lactation Consultation Note  Patient Name: Amy [name] [id number] Date: 07/21/2018 Reason for consult: Initial assessment;Primapara;1st time breastfeeding;Term  Visited with P1 Mom of term baby at 5 hrs old, baby at 1% weight loss.  Baby had latched 3 times, but has been sleepy for last 5 hrs.  Baby dressed and sleeping near Mom.  Offered to place baby STS to encourage baby to cue and latch.  Reviewed breast massage and hand expression, drops of colostrum expressed onto spoon and fed to baby.  Baby showing no interest in feeding at this point.    Normal newborn behavior reviewed.  Encouraged continued STS and watching for cues.  Encouraged to ask for help when baby is hungry.   Lactation brochure left with Mom.    Maternal Data Formula Feeding for Exclusion: No Has patient been taught Hand Expression?: Yes Does the patient have breastfeeding experience prior to this delivery?: No  Feeding Feeding Type: Breast Milk  LATCH Score Latch: Too sleepy or reluctant, no latch achieved, no sucking elicited.  Audible Swallowing: None  Type of Nipple: Everted at rest and after stimulation  Comfort (Breast/Nipple): Soft / non-tender  Hold (Positioning): Full assist, staff holds infant at breast  LATCH Score: 4  Interventions Interventions: Breast feeding basics reviewed;Assisted with latch;Skin to skin;Breast massage;Breast compression;Hand express;Adjust position;Support pillows;Position options;Expressed milk  Lactation Tools Discussed/Used WIC Program: No   Consult Status Consult Status: Follow-up Date: 07/22/18 Follow-up type: In-patient    Amy [name] 07/21/2018, 12:53 PM

## 2018-07-22 NOTE — Progress Notes (Signed)
Subjective: Postpartum Day 2: Cesarean Delivery Patient reports incisional pain, tolerating PO and no problems voiding.    Objective: Vital signs in last 24 hours: Temp:  [98.1 F (36.7 C)-99.4 F (37.4 C)] 99.4 F (37.4 C) (06/02 0530) Pulse Rate:  [114-131] 120 (06/02 0530) Resp:  [18-19] 18 (06/02 0530) BP: (90-115)/(54-67) 102/59 (06/02 0530) SpO2:  [98 %-100 %] 98 % (06/02 0530)  Physical Exam:  General: alert, cooperative and no distress Lochia: appropriate Uterine Fundus: firm Incision: healing well DVT Evaluation: No evidence of DVT seen on physical exam.  Recent Labs    07/19/18 2142 07/21/18 0516  HGB 11.0* 8.8*  HCT 33.5* 27.5*    Assessment/Plan: Status post Cesarean section. Doing well postoperatively.  Maternal tachycardia- improved encouraged po hydration.. pt is afebrile will continue to monitor  Encouraged ambulation Probable discharge home tomorrow .  Gerald Leitz 07/22/2018, 11:05 AM

## 2018-07-22 NOTE — Plan of Care (Signed)
  Problem: Activity: Goal: Will verbalize the importance of balancing activity with adequate rest periods Note:  Discussed with patient the importance of increasing activity today and ambulating in hallway to assist with circulation, healing and pain control. Also discussed pain medication options since she seems to still feel increased discomfort with ambulating. Patient may try 10 mg of oxycodone next time she can have it. Provided patient with scheduled medications also. Earl Gala, Linda Hedges [place]

## 2018-07-22 NOTE — Lactation Note (Signed)
This note was copied from a baby's chart. Lactation Consultation Note  Patient Name: Amy [name] Date: 07/22/2018 Reason for consult: Follow-up assessment;Infant weight loss;Primapara;1st time breastfeeding;Other (Comment);Term(post dates )  Pecola Leisure is 15 hours old  Mom called to request LC as LC requested her to.  Baby awake, extra large diaper changed, and small transitional stool changed by [facility] and showed mom and dad. Baby STS - worked with mom latching on the right breast / cross cradle/ baby opening wider/ latched with depth to  Start and would move away from mom latched/ lips flanged,few swallows noted/ but more non - nutritive feeding pattern.  LC recommended 2 options with every latch -  to keep the [facility] plan simple : to counteract the short frenulum.  Prior to latch - breast massage, hand express, pre-pump to prime the milk ducts, reverse pressure if needed.  Firm support/ work on aiming nipple towards baby's nose and tap upper lip and latch with compressions until swallows.  Have dad prepare the 34F SNS and insert it in the side of the mouth after the baby is latched / allow the baby to pull  The milk down out of the syringe.  Shells between feedings except when sleeping to elongate the nipple to counteract the short anterior frenulum.   LC instructed mom on the hand pump , checked the #24 F and good fit, 34F SNS and showed dad how to clean.  And shells.   LC reported MBURN Hermelinda Medicus.      Maternal Data Has patient been taught Hand Expression?: Yes  Feeding Feeding Type: Breast Milk with Formula added  LATCH Score Latch: Grasps breast easily, tongue down, lips flanged, rhythmical sucking.  Audible Swallowing: Spontaneous and intermittent  Type of Nipple: Everted at rest and after stimulation  Comfort (Breast/Nipple): Soft / non-tender  Hold (Positioning): Assistance needed to correctly position infant at breast and maintain latch.  LATCH Score:  9  Interventions Interventions: Breast feeding basics reviewed;Assisted with latch;Skin to skin;Breast massage;Hand express;Breast compression;Reverse pressure;Adjust position;Support pillows;Position options;Shells;Hand pump  Lactation Tools Discussed/Used Tools: Shells;Pump;Flanges Flange Size: 24 Shell Type: Inverted Breast pump type: Manual Pump Review: Setup, frequency, and cleaning Initiated by:: MAI  Date initiated:: 07/22/18   Consult Status Consult Status: Follow-up Date: 07/23/18 Follow-up type: In-patient    Amy [name] 07/22/2018, 2:49 PM

## 2018-07-22 NOTE — Lactation Note (Signed)
This note was copied from a baby's chart. Lactation Consultation Note  Patient Name: Boy Tempa Wittkop IRJJO'A Date: 07/22/2018 Reason for consult: Follow-up assessment;Mother's request;Term P1. 30 hour female infant.  Per Dad, infant had 7 stools but no wet diapers today. Per mom, she experienced pain with few latches and feels infant not deep enough on breast. LC ask mom to do breast stimulation prior to latching infant to breast, mom latched infant on right breast using football hold, a few swallows heard, infant was latched  chin first with wide mouth gape and infant breastfeeding for 10 minutes. LC reviewed hand expression with mom and infant was given 2 ml of colostrum by spoon, infant was still cuing, mom latched infant on left breast using football hold, infant was still breastfeeding when LC left room, Per mom, she was not feeling pain, only a tug, LC explained if mom is feeling pain, break latch and re-latch infant to prevent abrasions on breast due to poor latching. Mom was receptive and appreciative.  Mom will continue to breastfeed according hunger cues, 8 to 12 times within 24 hours and on demand. Mom knows to call Nurse or LC if she has any questions, concerns or need assistance with latching infant to breast.  Maternal Data    Feeding Feeding Type: Breast Fed  LATCH Score Latch: Grasps breast easily, tongue down, lips flanged, rhythmical sucking.  Audible Swallowing: A few with stimulation  Type of Nipple: Everted at rest and after stimulation  Comfort (Breast/Nipple): Soft / non-tender  Hold (Positioning): Assistance needed to correctly position infant at breast and maintain latch.  LATCH Score: 8  Interventions Interventions: Assisted with latch;Skin to skin;Adjust position;Support pillows;Breast compression;Position options;Expressed milk  Lactation Tools Discussed/Used     Consult Status Consult Status: Follow-up Date: 07/22/18 Follow-up type:  In-patient    Danelle Earthly 07/22/2018, 4:20 AM

## 2018-07-22 NOTE — Lactation Note (Signed)
This note was copied from a baby's chart. Lactation Consultation Note  Patient Name: Amy [name] [id number] Date: 07/22/2018 Reason for consult: Follow-up assessment;Primapara;1st time breastfeeding;Other (Comment);Term(post dates / MD request / ? HNV )  Dr. Erik Obey asked this  LC to see this dyad due to no documented wets in the baby life.  LC reviewed the doc flow sheets and noted the baby to have several stools ( 8 stools ) and  Dad mentioned the stools are thinning and more a mec blackish green.  LC showed mom and dad the yellow indicator in the diaper and where boys wet in the upper part of the  Diaper. Per dad has changed the diapers and thinks he probably has missed some due to not being aware  That's where the boys wet.  LC changed a one small drop diaper at the consult and showed mom and dad.   Just prior to assisting and latching the baby LC noted the baby cupping his tongue on both sides and  A short anterior frenulum, is able to stretch forward over gum line. Upper lip stretches well with latch.  Baby awake and latched easily with flanged lips and would pull back like he was expecting the quick flow.  Prior to latch - LC had mom massage, and hand express with several drops noted. Areola compresses well  Prior to latch and worked on depth. Mom reports she was more comfortable. Noted several swallows in the beginning of the feeding and then the baby was sluggish. LC recommended for mom to call when baby is showing  Stronger signs of feeding cues.   Mom mentioned the baby fed well during the night with swallows, but experienced some soreness and discomfort  With feeding.   LC informed Dr. Lowella Curb of results of consult . See above.    Maternal Data Has patient been taught Hand Expression?: Yes  Feeding Feeding Type: Breast Fed  LATCH Score Latch: Grasps breast easily, tongue down, lips flanged, rhythmical sucking.  Audible Swallowing: A few with stimulation(to start  and then less )  Type of Nipple: Everted at rest and after stimulation  Comfort (Breast/Nipple): Soft / non-tender  Hold (Positioning): Assistance needed to correctly position infant at breast and maintain latch.  LATCH Score: 8  Interventions Interventions: Breast feeding basics reviewed;Assisted with latch;Skin to skin;Breast massage;Breast compression;Adjust position;Support pillows;Position options;Expressed milk  Lactation Tools Discussed/Used     Consult Status Consult Status: Follow-up Date: 07/22/18 Follow-up type: In-patient    [name] [name] 07/22/2018, 12:47 PM

## 2018-07-23 MED ORDER — DOCUSATE SODIUM 100 MG PO CAPS: 100.0000 mg | ORAL_CAPSULE | Freq: Two times a day (BID) | ORAL | 0 refills | Status: DC

## 2018-07-23 MED ORDER — OXYCODONE HCL 5 MG PO TABS: 5.0000 mg | ORAL_TABLET | Freq: Four times a day (QID) | ORAL | 0 refills | Status: AC | PRN

## 2018-07-23 MED ORDER — IBUPROFEN 600 MG PO TABS: 600.0000 mg | ORAL_TABLET | Freq: Four times a day (QID) | ORAL | 0 refills | Status: DC | PRN

## 2018-07-23 MED ORDER — SERTRALINE HCL 50 MG PO TABS: 50.0000 mg | ORAL_TABLET | Freq: Every day | ORAL | 6 refills | Status: DC

## 2018-07-23 MED FILL — IBUPROFEN 600 MG TABLET: 600 | 7 days supply | Qty: 30 | Fill #0

## 2018-07-23 MED FILL — SERTRALINE HCL 50 MG TABS: 50 | 30 days supply | Qty: 30 | Fill #0

## 2018-07-23 MED FILL — oxyCODONE HCL 5 MG TABS: 5 | 3 days supply | Qty: 30 | Fill #0

## 2018-07-23 NOTE — Discharge Instructions (Signed)
Cesarean Delivery, Care After This sheet gives you information about how to care for yourself after your procedure. Your health care provider may also give you more specific instructions. If you have problems or questions, contact your health care provider. What can I expect after the procedure? After the procedure, it is common to have:  A small amount of blood or clear fluid coming from the incision.  Some redness, swelling, and pain in your incision area.  Some abdominal pain and soreness.  Vaginal bleeding (lochia). Even though you did not have a vaginal delivery, you will still have vaginal bleeding and discharge.  Pelvic cramps.  Fatigue. You may have pain, swelling, and discomfort in the tissue between your vagina and your anus (perineum) if:  Your C-section was unplanned, and you were allowed to labor and push.  An incision was made in the area (episiotomy) or the tissue tore during attempted vaginal delivery. Follow these instructions at home: Incision care   Follow instructions from your health care provider about how to take care of your incision. Make sure you: ? Wash your hands with soap and water before you change your bandage (dressing). If soap and water are not available, use hand sanitizer. ? If you have a dressing, change it or remove it as told by your health care provider. ? Leave stitches (sutures), skin staples, skin glue, or adhesive strips in place. These skin closures may need to stay in place for 2 weeks or longer. If adhesive strip edges start to loosen and curl up, you may trim the loose edges. Do not remove adhesive strips completely unless your health care provider tells you to do that.  Check your incision area every day for signs of infection. Check for: ? More redness, swelling, or pain. ? More fluid or blood. ? Warmth. ? Pus or a bad smell.  Do not take baths, swim, or use a hot tub until your health care provider says it's okay. Ask your health  care provider if you can take showers.  When you cough or sneeze, hug a pillow. This helps with pain and decreases the chance of your incision opening up (dehiscing). Do this until your incision heals. Medicines  Take over-the-counter and prescription medicines only as told by your health care provider.  For pain management alternate between Ibuprofen and Tylenol every 6 hours.  For severe pain, you may also take the oxycodone.  This medication will cause constipation, be sure to continue with stool softener twice daily while taking this medication  If you were prescribed an antibiotic medicine, take it as told by your health care provider. Do not stop taking the antibiotic even if you start to feel better.  Do not drive or use heavy machinery while taking prescription pain medicine. Lifestyle  Do not drink alcohol. This is especially important if you are breastfeeding or taking pain medicine.  Do not use any products that contain nicotine or tobacco, such as cigarettes, e-cigarettes, and chewing tobacco. If you need help quitting, ask your health care provider. Eating and drinking  Drink at least 8 eight-ounce glasses of water every day unless told not to by your health care provider. If you breastfeed, you may need to drink even more water.  Eat high-fiber foods every day. These foods may help prevent or relieve constipation. High-fiber foods include: ? Whole grain cereals and breads. ? Brown rice. ? Beans. ? Fresh fruits and vegetables. Activity   If possible, have someone help you care for  your baby and help with household activities for at least a few days after you leave the hospital.  Return to your normal activities as told by your health care provider. Ask your health care provider what activities are safe for you.  Rest as much as possible. Try to rest or take a nap while your baby is sleeping.  Do not lift anything that is heavier than 10 lbs (4.5 kg), or the limit that  you were told, until your health care provider says that it is safe.  Talk with your health care provider about when you can engage in sexual activity. This may depend on your: ? Risk of infection. ? How fast you heal. ? Comfort and desire to engage in sexual activity. General instructions  Do not use tampons or douches until your health care provider approves.  Wear loose, comfortable clothing and a supportive and well-fitting bra.  Keep your perineum clean and dry. Wipe from front to back when you use the toilet.  If you pass a blood clot, save it and call your health care provider to discuss. Do not flush blood clots down the toilet before you get instructions from your health care provider.  Keep all follow-up visits for you and your baby as told by your health care provider. This is important. Contact a health care provider if:  You have: ? A fever. ? Bad-smelling vaginal discharge. ? Pus or a bad smell coming from your incision. ? Difficulty or pain when urinating. ? A sudden increase or decrease in the frequency of your bowel movements. ? More redness, swelling, or pain around your incision. ? More fluid or blood coming from your incision. ? A rash. ? Nausea. ? Little or no interest in activities you used to enjoy. ? Questions about caring for yourself or your baby.  Your incision feels warm to the touch.  Your breasts turn red or become painful or hard.  You feel unusually sad or worried.  You vomit.  You pass a blood clot from your vagina.  You urinate more than usual.  You are dizzy or light-headed. Get help right away if:  You have: ? Pain that does not go away or get better with medicine. ? Chest pain. ? Difficulty breathing. ? Blurred vision or spots in your vision. ? Thoughts about hurting yourself or your baby. ? New pain in your abdomen or in one of your legs. ? A severe headache.  You faint.  You bleed from your vagina so much that you fill  more than one sanitary pad in one hour. Bleeding should not be heavier than your heaviest period. Summary  After the procedure, it is common to have pain at your incision site, abdominal cramping, and slight bleeding from your vagina.  Check your incision area every day for signs of infection.  Tell your health care provider about any unusual symptoms.  Keep all follow-up visits for you and your baby as told by your health care provider. This information is not intended to replace advice given to you by your health care provider. Make sure you discuss any questions you have with your health care provider. Document Released: 10/28/2001 Document Revised: 08/14/2017 Document Reviewed: 08/14/2017 Elsevier Interactive Patient Education  2019 [employer].

## 2018-07-23 NOTE — Discharge Summary (Signed)
OB Discharge Summary     Patient Name: Amy SalvoShanice V Colberg DOB: [birth year] MRN: [id number]  Date of admission: 07/19/2018 Delivering MD: Essie HartPINN, WALDA   Date of discharge: 07/23/2018  Admitting diagnosis: CTX Intrauterine pregnancy: [id number]     Secondary diagnosis:  Active Problems:   Normal labor and delivery  Additional problems: Obesity, anxiety     Discharge diagnosis: Term Pregnancy Delivered                                                                                                Post partum procedures:none  Augmentation: AROM and Pitocin  Complications: Intrauterine Inflammation or infection (Chorioamniotis)  Hospital course:  Onset of Labor With Unplanned C/S  33 y.o. yo G1P1001 at 7534w6d was admitted in Latent Labor on 07/19/2018. Patient had a labor course significant for Failure to progress. Membrane Rupture Time/Date: 4:00 PM ,07/19/2018   The patient went for cesarean section due to Arrest of Dilation and Arrest of Descent, and delivered a Viable infant,07/20/2018  Details of operation can be found in separate operative note. Patient postpartum course was complicated by suspected chorioamnionitis as she was noted to have a fever and tachycardia.  She received Unasyn IV x 1 and fluids.  Symptoms improved and by postoperative day 3 she is ambulating,tolerating a regular diet, passing flatus, and urinating well.  Patient is discharged home in stable condition 07/23/18.  Physical exam  Vitals:   07/22/18 1530 07/22/18 2015 07/23/18 0003 07/23/18 0431  BP: 101/74 110/76 115/71 108/74  Pulse: (!) 103 (!) 105 (!) 101 95  Resp: 16  18 18   Temp: 98.6 F (37 C) 98.3 F (36.8 C) 97.8 F (36.6 C) 98.1 F (36.7 C)  TempSrc: Oral Oral Oral Oral  SpO2:  100% 99% 98%  Weight:      Height:       General: alert, cooperative and no distress  CV: RRR Lungs; CTAB Lochia: appropriate Uterine Fundus: firm Incision: Dressing is clean, dry, and intact DVT Evaluation: No evidence of  DVT seen on physical exam. Labs: Lab Results  Component Value Date   WBC 15.0 (H) 07/21/2018   HGB 8.8 (L) 07/21/2018   HCT 27.5 (L) 07/21/2018   MCV 85.4 07/21/2018   PLT 190 07/21/2018   CMP Latest Ref Rng & Units 02/28/2017  Glucose 65 - 99 mg/dL 83  BUN 7 - 25 mg/dL 16  Creatinine 4.090.50 - 8.111.10 mg/dL 9.140.78  Sodium 782135 - 956146 mmol/L 135  Potassium 3.5 - 5.3 mmol/L 4.1  Chloride 98 - 110 mmol/L 100  CO2 20 - 32 mmol/L 23  Calcium 8.6 - 10.2 mg/dL 9.1  Total Protein 6.1 - 8.1 g/dL 7.9  Total Bilirubin 0.2 - 1.2 mg/dL 0.2  Alkaline Phos 39 - 117 U/L -  AST 10 - 30 U/L 18  ALT 6 - 29 U/L 19    Discharge instruction: per After Visit Summary and "Baby and Me Booklet".  After visit meds:  Allergies as of 07/23/2018   No Known Allergies     Medication List    TAKE these medications  docusate sodium 100 MG capsule Commonly known as:  Colace Take 1 capsule (100 mg total) by mouth 2 (two) times daily.   ibuprofen 600 MG tablet Commonly known as:  ADVIL Take 1 tablet (600 mg total) by mouth every 6 (six) hours as needed.   multivitamin-prenatal 27-0.8 MG Tabs tablet Take 1 tablet by mouth daily at 12 noon.   oxyCODONE 5 MG immediate release tablet Commonly known as:  Oxy IR/ROXICODONE Take 1-2 tablets (5-10 mg total) by mouth every 6 (six) hours as needed for up to 7 days for moderate pain or severe pain.   sertraline 50 MG tablet Commonly known as:  ZOLOFT Take 1 tablet (50 mg total) by mouth at bedtime.   valACYclovir 500 MG tablet Commonly known as:  VALTREX TAKE 1 TABLET BY MOUTH ONCE DAILY What changed:    how much to take  how to take this  when to take this       Diet: routine diet  Activity: Advance as tolerated. Pelvic rest for 6 weeks.   Outpatient follow up:2 weeks Follow up Appt:No future appointments. Follow up Visit:No follow-ups on file.  Postpartum contraception: Undecided  Newborn Data: Live born female  Birth Weight: 8 lb 3.2 oz (3719  g) APGAR: 3, 8  Newborn Delivery   Birth date/time:  07/20/2018 21:21:00 Delivery type:  C-Section, Low Transverse Trial of labor:  Yes C-section categorization:  Primary     Baby Feeding: Breast Disposition:home with mother   07/23/2018 Sharon Seller, DO

## 2018-07-23 NOTE — Lactation Note (Signed)
This note was copied from a baby's chart. Lactation Consultation Note  Patient Name: Amy [name] ZESPQ'Z Date: 07/23/2018 Reason for consult: Follow-up assessment;Term;Primapara;1st time breastfeeding;Infant weight loss;Other (Comment)(4% weight loss/ short anterior frenulum - see LC note )  Baby is 7 hours old for D/C today /  LC asked how the [facility] plan was working out.  Dad mentioned they had tried using the 76F SNS and the baby seemed  To do better with that compared to when he latched without it.  Also was using a bottle to supplement.  LC reviewed supply/ demand and  Importance of consistent breast stimulation  To enhance the  Milk coming in at least 8-10 times a day. LC stressed the importance  If the baby isn't latching due to short frenulum and no obtaining the depth to make sure she  Is pumping.  LC reviewed the [facility] plan with using the 76F  SNS at the breast to  Help the baby get into a  Consistent pattern.  Prior to latch - breast massage, hand express, pre-pump to prime the milk ducts, and reverse pressure.  Latch with firm support and breast compressions with dads help.  Feed at least 15 -80m mins with the 5 F SNS.  And post pump for 15 -20 mins until the milk comes in.   Sore nipple and engorgement prevention and tx reviewed.  Mom has hand pump and a DEBP at home and shells.  LC stressed the importance of either wearing the shells between feedings or at least 10 mins prior to the  Feeding until the areola edema improves.   LC offered mom to request a LC O/P appt at the [facility] O/P and mom receptive for next Monday.  LC placed a request in the Epic basket for the clinic to call mom.   Mom also aware of the [facility] phone line and the virtual BFSG.   Dad mentioned the Pediatrician is aware of the tongue - tie and if needed can give them a referral if needed.      Maternal Data Has patient been taught Hand Expression?: Yes  Feeding Feeding Type: Bottle Fed -  Formula Nipple Type: Slow - flow  LATCH Score                   Interventions Interventions: Breast feeding basics reviewed  Lactation Tools Discussed/Used Tools: Shells;Pump Flange Size: 24 Shell Type: Inverted Breast pump type: Manual Pump Review: Milk Storage   Consult Status Consult Status: Follow-up(LC offered to request and LC O/P for next Monday and mom receptive ) Date: (mom receptive to coming back for [facility] O/P ) Follow-up type: Out-patient(LC placed a request in the [place] LC O/P )    [name] Balthazar Dooly 07/23/2018, 11:42 AM

## 2018-07-23 NOTE — Progress Notes (Signed)
CSW received consult due to score 13 on Edinburgh Depression Screen.    CSW spoke with MOB at bedside. Upon entering the room CSW observed that MOB was sitting up at bedside while FOB held infant while infant slept. CSW congratulated MOB and FOB on the birth of infant. CSW advised MOB of the HIPPA policy and MOB requested that FOB remain in room. CSW understanding and advised MOB of the reason for the visit. CSW was advised that MOB was diagnosed with anxiety about three years ago. MOB expressed that anxiety is more situational for her. CSW was advised that MOB had been off meds for over a year but recently started medications again. MOB expressed that she is not feeling SI or HI at this time.   MOB reports that she has been feeling well and happy since giving birth. MOB expressed that she has all needed items for infant at this time.   CSW provided education regarding Baby Blues vs PMADs and provided MOB with resources for mental health follow up.  CSW encouraged MOB to evaluate her mental health throughout the postpartum period with the use of the New Mom Checklist developed by [employer]partum [employer] as well as the Edinburgh Postnatal Depression Scale and notify a medical professional if symptoms arise.       Amy [name] S. Worthington Cruzan, MSW, LCSW-A [facility] [phone]  

## 2018-07-23 NOTE — Anesthesia Postprocedure Evaluation (Signed)
Anesthesia Post Note  Patient: Amy [name]  Procedure(s) Performed: CESAREAN SECTION (N/A )     Patient location during evaluation: PACU Anesthesia Type: Epidural Level of consciousness: oriented and awake and alert Pain management: pain level controlled Vital Signs Assessment: post-procedure vital signs reviewed and stable Respiratory status: spontaneous breathing, respiratory function stable and patient connected to nasal cannula oxygen Cardiovascular status: blood pressure returned to baseline and stable Postop Assessment: no headache, no backache and no apparent nausea or vomiting Anesthetic complications: no    Last Vitals:  Vitals:   07/23/18 0003 07/23/18 0431  BP: 115/71 108/74  Pulse: (!) 101 95  Resp: 18 18  Temp: 36.6 C 36.7 C  SpO2: 99% 98%    Last Pain:  Vitals:   07/23/18 0800  TempSrc:   PainSc: 0-No pain                 [name] S

## 2018-07-24 ENCOUNTER — Encounter (HOSPITAL_COMMUNITY): Payer: Self-pay

## 2018-07-24 ENCOUNTER — Other Ambulatory Visit: Payer: Self-pay

## 2018-07-24 ENCOUNTER — Inpatient Hospital Stay (HOSPITAL_COMMUNITY)
Admission: AD | Admit: 2018-07-24 | Discharge: 2018-07-25 | Disposition: A | Discharge status: Home / Self Care | Payer: 59 | Attending: Obstetrics & Gynecology | Admitting: Obstetrics & Gynecology

## 2018-07-24 DIAGNOSIS — N719 Inflammatory disease of uterus, unspecified: Secondary | ICD-10-CM | POA: Diagnosis not present

## 2018-07-24 DIAGNOSIS — N3001 Acute cystitis with hematuria: Secondary | ICD-10-CM | POA: Diagnosis not present

## 2018-07-24 DIAGNOSIS — K5903 Drug induced constipation: Secondary | ICD-10-CM | POA: Insufficient documentation

## 2018-07-24 DIAGNOSIS — R509 Fever, unspecified: Secondary | ICD-10-CM | POA: Diagnosis present

## 2018-07-24 LAB — URINALYSIS, ROUTINE W REFLEX MICROSCOPIC
Bilirubin Urine: NEGATIVE
Glucose, UA: NEGATIVE mg/dL
Ketones, ur: NEGATIVE mg/dL
Nitrite: NEGATIVE
Protein, ur: 30 mg/dL — AB
RBC / HPF: >50 RBC/hpf — ABNORMAL HIGH (ref 0–5)
Specific Gravity, Urine: 1.018 (ref 1.005–1.030)
WBC, UA: >50 WBC/hpf — ABNORMAL HIGH (ref 0–5)
pH: 6 (ref 5.0–8.0)

## 2018-07-24 LAB — CBC WITH DIFFERENTIAL/PLATELET
Abs Immature Granulocytes: 0.18 10*3/uL — ABNORMAL HIGH (ref 0.00–0.07)
Basophils Absolute: 0 10*3/uL (ref 0.0–0.1)
Basophils Relative: 0 %
Eosinophils Absolute: 0.1 10*3/uL (ref 0.0–0.5)
Eosinophils Relative: 0 %
HCT: 22.7 % — ABNORMAL LOW (ref 36.0–46.0)
Hemoglobin: 7.5 g/dL — ABNORMAL LOW (ref 12.0–15.0)
Immature Granulocytes: 1 %
Lymphocytes Relative: 12 %
Lymphs Abs: 1.6 10*3/uL (ref 0.7–4.0)
MCH: 28 pg (ref 26.0–34.0)
MCHC: 33 g/dL (ref 30.0–36.0)
MCV: 84.7 fL (ref 80.0–100.0)
Monocytes Absolute: 1.1 10*3/uL — ABNORMAL HIGH (ref 0.1–1.0)
Monocytes Relative: 8 %
Neutro Abs: 10.6 10*3/uL — ABNORMAL HIGH (ref 1.7–7.7)
Neutrophils Relative %: 79 %
Platelets: 289 10*3/uL (ref 150–400)
RBC: 2.68 MIL/uL — ABNORMAL LOW (ref 3.87–5.11)
RDW: 16.3 % — ABNORMAL HIGH (ref 11.5–15.5)
WBC: 13.6 10*3/uL — ABNORMAL HIGH (ref 4.0–10.5)
nRBC: 0.6 % — ABNORMAL HIGH (ref 0.0–0.2)

## 2018-07-24 LAB — BASIC METABOLIC PANEL
Anion gap: 10 (ref 5–15)
BUN: 8 mg/dL (ref 6–20)
CO2: 21 mmol/L — ABNORMAL LOW (ref 22–32)
Calcium: 8.6 mg/dL — ABNORMAL LOW (ref 8.9–10.3)
Chloride: 106 mmol/L (ref 98–111)
Creatinine, Ser: 0.79 mg/dL (ref 0.44–1.00)
GFR calc Af Amer: >60 mL/min (ref >60)
GFR calc non Af Amer: >60 mL/min (ref >60)
Glucose, Bld: 89 mg/dL (ref 70–99)
Potassium: 3.8 mmol/L (ref 3.5–5.1)
Sodium: 137 mmol/L (ref 135–145)

## 2018-07-24 MED ORDER — FLEET ENEMA 7-19 GM/118ML RE ENEM
1.0000 | ENEMA | Freq: Once | RECTAL | Status: AC
  Administered 2018-07-24: 23:00:00 1 via RECTAL

## 2018-07-24 NOTE — MAU Provider Note (Signed)
Chief Complaint:  Fever   First Provider Initiated Contact with Patient 07/24/18 2236     [place]  HPI: Amy [name] is a 33 y.o. G1P1001 who presents to maternity admissions reporting one episode of fever, abdominal bloating, incisional pain.  Pain was somewhat relieved by passing gas.  Has not had a BM since before surgery.  Took one dose of miralax this morning.  Only breastfeeding 3 times a day.  Breasts not tender.  No pain in legs.  No cough or sore throat. . She reports vaginal bleeding, vaginal itching/burning, urinary symptoms, h/a, dizziness, n/v, or fever/chills.    RN note: Walking a few laps around house on the inside and sit down and felt hot and took temp, was 99.9 then waited few minutes, rechecked 100.1. Felt like heartbeat was up today like 115 bpm.  When woke up from nap at 6 pm, noticed pain level increased of incisional area and abd / belly button, took pain med then.  Once passed a lot of gas, pain level improved. No BM x 5 days. Started colace and Miralax last night.   Past Medical History: Past Medical History:  Diagnosis Date  . Anxiety    with flying only  . GERD (gastroesophageal reflux disease)   . Herpes   . Vaginal Pap smear, abnormal     Past obstetric history: OB History  Gravida Para Term Preterm AB Living  SAB TAB Ectopic Multiple Live Births        0 1    # Outcome Date GA Lbr Len/2nd Weight Sex Delivery Anes PTL Lv  1 Term 07/20/18 [redacted]w[redacted]d  3719 g M CS-LTranv EPI  LIV    Past Surgical History: Past Surgical History:  Procedure Laterality Date  . CERVICAL BIOPSY  W/ LOOP ELECTRODE EXCISION    . CESAREAN SECTION N/A 07/20/2018   Procedure: CESAREAN SECTION;  Surgeon: Essie Hart, MD;  Location: [facility];  Service: Obstetrics;  Laterality: N/A;  . WISDOM TOOTH EXTRACTION      Family History: Family History  Problem Relation Age of Onset  . Hypertension Mother   . Hyperlipidemia Mother   . Hyperlipidemia Father   . Alcohol  abuse Father   . Cirrhosis Father   . Hypertension Maternal Aunt   . Heart disease Paternal Grandmother     Social History: Social History   Tobacco Use  . Smoking status: Never Smoker  . Smokeless tobacco: Never Used  Substance Use Topics  . Alcohol use: Not Currently    Comment: social  . Drug use: No    Allergies: No Known Allergies  Meds:  Medications Prior to Admission  Medication Sig Dispense Refill Last Dose  . docusate sodium (COLACE) 100 MG capsule Take 1 capsule (100 mg total) by mouth 2 (two) times daily. 60 capsule 0 07/23/2018 at Unknown time  . ibuprofen (ADVIL) 600 MG tablet Take 1 tablet (600 mg total) by mouth every 6 (six) hours as needed. 30 tablet 0 07/24/2018 at Unknown time  . oxyCODONE (OXY IR/ROXICODONE) 5 MG immediate release tablet Take 1-2 tablets (5-10 mg total) by mouth every 6 (six) hours as needed for up to 7 days for moderate pain or severe pain. 30 tablet 0 07/24/2018 at Unknown time  . polyethylene glycol (MIRALAX / GLYCOLAX) 17 g packet Take 17 g by mouth daily.   07/23/2018 at Unknown time  . sertraline (ZOLOFT) 50 MG tablet Take 1 tablet (  50 mg total) by mouth at bedtime. 30 tablet 6 Past Week at Unknown time  . Prenatal Vit-Fe Fumarate-FA (MULTIVITAMIN-PRENATAL) 27-0.8 MG TABS tablet Take 1 tablet by mouth daily at 12 noon.   Past Week at Unknown time  . valACYclovir (VALTREX) 500 MG tablet TAKE 1 TABLET BY MOUTH ONCE DAILY (Patient taking differently: 2 (two) times daily. ) 90 tablet 1 07/21/2018 at Unknown time    I have reviewed patient's Past Medical Hx, Surgical Hx, Family Hx, Social Hx, medications and allergies.  ROS:  Review of Systems  Constitutional: Negative for chills and fever.  HENT: Negative for sore throat.   Eyes: Negative for visual disturbance.  Respiratory: Negative for cough and shortness of breath.   Gastrointestinal: Positive for abdominal distention, abdominal pain and constipation. Negative for diarrhea and nausea.   Genitourinary: Positive for vaginal bleeding (normal lochia). Negative for difficulty urinating, dysuria and flank pain.  Neurological: Negative for dizziness and weakness.   Other systems negative     Physical Exam   Patient Vitals for the past 24 hrs:  BP Temp Temp src Pulse Resp SpO2 Height Weight  07/25/18 0044 128/77 98.4 F (36.9 C) Oral 98 16 98 % - -  07/24/18 2238 130/65 98.5 F (36.9 C) Oral (!) 113 18 - 5\' 4"  (1.626 m) 98.6 kg   Constitutional: Well-developed, well-nourished female in no acute distress.  Cardiovascular: normal rate and rhythm, no ectopy audible, S1 & S2 heard, no murmur Respiratory: normal effort, no distress. Lungs CTAB with no wheezes or crackles Breasts:   Bilaterally nontender, no induration, No masses, no erethema GI: Abd soft, slightly tender over uterus.  Somewhat distended.  No rebound, No guarding.  Bowel Sounds audible    Honeycomb dressing is clean and intact.   MS: Extremities nontender, no edema, normal ROM   Negative Homans Neurologic: Alert and oriented x 4.   Grossly nonfocal. GU: Neg CVAT. Skin:  Warm and Dry Psych:  Affect appropriate.    Labs: Results for orders placed or performed during the hospital encounter of 07/24/18 (from the past 24 hour(s))  Urinalysis, Routine w reflex microscopic     Status: Abnormal   Collection Time: 07/24/18 10:20 PM  Result Value Ref Range   Color, Urine YELLOW YELLOW   APPearance CLEAR CLEAR   Specific Gravity, Urine 1.018 1.005 - 1.030   pH 6.0 5.0 - 8.0   Glucose, UA NEGATIVE NEGATIVE mg/dL   Hgb urine dipstick LARGE (A) NEGATIVE   Bilirubin Urine NEGATIVE NEGATIVE   Ketones, ur NEGATIVE NEGATIVE mg/dL   Protein, ur 30 (A) NEGATIVE mg/dL   Nitrite NEGATIVE NEGATIVE   Leukocytes,Ua LARGE (A) NEGATIVE   RBC / HPF >50 (H) 0 - 5 RBC/hpf   WBC, UA >50 (H) 0 - 5 WBC/hpf   Bacteria, UA RARE (A) NONE SEEN   Squamous Epithelial / LPF 0-5 0 - 5   Mucus PRESENT    Non Squamous Epithelial 0-5 (A)  NONE SEEN  CBC with Differential/Platelet     Status: Abnormal   Collection Time: 07/24/18 10:45 PM  Result Value Ref Range   WBC 13.6 (H) 4.0 - 10.5 K/uL   RBC 2.68 (L) 3.87 - 5.11 MIL/uL   Hemoglobin 7.5 (L) 12.0 - 15.0 g/dL   HCT 29.522.7 (L) 62.136.0 - 30.846.0 %   MCV 84.7 80.0 - 100.0 fL   MCH 28.0 26.0 - 34.0 pg   MCHC 33.0 30.0 - 36.0 g/dL   RDW 65.716.3 (H) 84.611.5 -  15.5 %   Platelets 289 150 - 400 K/uL   nRBC 0.6 (H) 0.0 - 0.2 %   Neutrophils Relative % 79 %   Neutro Abs 10.6 (H) 1.7 - 7.7 K/uL   Lymphocytes Relative 12 %   Lymphs Abs 1.6 0.7 - 4.0 K/uL   Monocytes Relative 8 %   Monocytes Absolute 1.1 (H) 0.1 - 1.0 K/uL   Eosinophils Relative 0 %   Eosinophils Absolute 0.1 0.0 - 0.5 K/uL   Basophils Relative 0 %   Basophils Absolute 0.0 0.0 - 0.1 K/uL   Immature Granulocytes 1 %   Abs Immature Granulocytes 0.18 (H) 0.00 - 0.07 K/uL  Basic metabolic panel     Status: Abnormal   Collection Time: 07/24/18 10:45 PM  Result Value Ref Range   Sodium 137 135 - 145 mmol/L   Potassium 3.8 3.5 - 5.1 mmol/L   Chloride 106 98 - 111 mmol/L   CO2 21 (L) 22 - 32 mmol/L   Glucose, Bld 89 70 - 99 mg/dL   BUN 8 6 - 20 mg/dL   Creatinine, Ser 9.82 0.44 - 1.00 mg/dL   Calcium 8.6 (L) 8.9 - 10.3 mg/dL   GFR calc non Af Amer >60 >60 mL/min   GFR calc Af Amer >60 >60 mL/min   Anion gap 10 5 - 15   --/--/A POS, A POS Performed at [facility], [address] [place], [place] [address]  [phone])  Imaging:  No results found.  MAU Course/MDM: I have ordered labs as follows:  CBC and BMET.  Both normal   Only slight leukocytosis. Hemoglobin stable, though slightly lower than 3 days ago.  Has not had much lochia since discharge.  No dizziness but does get fatigued if walking a lot.   Imaging ordered:  None Results reviewed.    Treatments in MAU included Fleet enema with good results of a moderate amount of stool   States abdominal pain is improved afterward.    Urine is indicative of  possible UTI.  Will sent for culture    Since uterus was a little tender and Urine suspicious, will prescribe Keflex to cover UTI and possible early endometritis.   .   Pt stable at time of discharge.  Discussed adjusting activity until hemoglobin rises.  Push PO fluids  Discussed needs to feed more often than every 6-8 hours. Watch for signs of mastitis. Advised to take another dose or two of Miralax.  Assessment: 1. Endometritis   2. Acute cystitis with hematuria   3. Drug-induced constipation     Plan: Discharge home Recommend push fluids, increase breast feeding.  Rx sent for Keflex for constipation.   Follow-up Information    Gynecology, [facility]etric[facility] And Follow up.   Specialty:  Obstetrics and Gynecology Contact information: [address] [place] [address] [phone]           Encouraged to return here or to other Urgent Care/ED if she develops worsening of symptoms, increase in pain, fever, or other concerning symptoms.   Wynelle Bourgeois CNM, MSN Certified Nurse-Midwife 07/25/2018 12:47 AM

## 2018-07-24 NOTE — MAU Note (Addendum)
Walking a few laps around house on the inside and sit down and felt hot and took temp, was 99.9 then waited few minutes, rechecked 100.1. Felt like heartbeat was up today like 115 bpm.  When woke up from nap at 6 pm, noticed pain level increased of incisional area and abd / belly button, took pain med then.  Once passed a lot of gas, pain level improved. No BM x 5 days. Started colace and Miralax last night.

## 2018-07-25 ENCOUNTER — Other Ambulatory Visit: Payer: Self-pay | Admitting: *Deleted

## 2018-07-25 DIAGNOSIS — N719 Inflammatory disease of uterus, unspecified: Secondary | ICD-10-CM | POA: Diagnosis not present

## 2018-07-25 DIAGNOSIS — N3001 Acute cystitis with hematuria: Secondary | ICD-10-CM | POA: Diagnosis not present

## 2018-07-25 DIAGNOSIS — K5903 Drug induced constipation: Secondary | ICD-10-CM | POA: Diagnosis not present

## 2018-07-25 MED ORDER — CEPHALEXIN 500 MG PO CAPS: 500.0000 mg | ORAL_CAPSULE | Freq: Four times a day (QID) | ORAL | 2 refills | Status: DC

## 2018-07-25 MED FILL — CEPHALEXIN 500 MG CAPSULE: 500 | 7 days supply | Qty: 28 | Fill #0

## 2018-07-25 NOTE — Patient Outreach (Signed)
[facility]) Care Management  07/25/2018  FILICIA LIENDO [birth year] [id number]  Transition of care call  Referral received: 07/21/18 Initial outreach: 07/25/18 Insurance: [facility] Choice Plan  Per patient's earlier request, attempted to reach patient at her preferred (mobile)  number in order to complete transition of care assessment; no answer, left HIPAA compliant voicemail message requesting return call.   Objective: Per the electronic medical record, Chelci Haughney was hospitalized at  [facility] from 5/30-6/3 for a cesarean section delivery of a abby boy weighing 8 lbs 3.2ozs. Jamice returned to the center on 6/5 for cystitis , endometritis and constipation and was discharged the same day.  Comorbidities include: generalized anxiety disorder, insomnia, obesity, prediabetes and Vit D deficiency.   Plan: This RNCM will route unsuccessful outreach letter with [facility] Care Management pamphlet and 24 hour Nurse Advice Line Magnet to [employer] Care Management clinical pool to be mailed to patient's home address. This RNCM will attempt another outreach within 4 business days.  Bary Richard RN,CCM,CDE [facility] Care Management Coordinator Office Phone [phone] Office Fax [phone]

## 2018-07-25 NOTE — Patient Outreach (Signed)
[facility]) Care Management  07/25/2018  Amy [name] January 29, 1986 [id number]   Transition of care call   Referral received: 07/21/18 Initial outreach: 07/25/18 Insurance: [place] Choice Plan   Subjective: Initial successful telephone call to patient's preferred (mobile) number in order to complete transition of care assessment; 2 HIPAA identifiers verified. Explained purpose of call and completed transition of care assessment.  Amy [name] states she is on the way to a pediatrician appointment with her new born son and requests a call back later this afternoon.  Plan: Will attempt to reach [facility] this afternoon.   Bary Richard RN,CCM,CDE [facility] Care Management Coordinator Office Phone [phone] Office Fax [phone]

## 2018-07-25 NOTE — Discharge Instructions (Signed)
Constipation, Adult Constipation is when a person has fewer bowel movements in a week than normal, has difficulty having a bowel movement, or has stools that are dry, hard, or larger than normal. Constipation may be caused by an underlying condition. It may become worse with age if a person takes certain medicines and does not take in enough fluids. Follow these instructions at home: Eating and drinking   Eat foods that have a lot of fiber, such as fresh fruits and vegetables, whole grains, and beans.  Limit foods that are high in fat, low in fiber, or overly processed, such as french fries, hamburgers, cookies, candies, and soda.  Drink enough fluid to keep your urine clear or pale yellow. General instructions  Exercise regularly or as told by your health care provider.  Go to the restroom when you have the urge to go. Do not hold it in.  Take over-the-counter and prescription medicines only as told by your health care provider. These include any fiber supplements.  Practice pelvic floor retraining exercises, such as deep breathing while relaxing the lower abdomen and pelvic floor relaxation during bowel movements.  Watch your condition for any changes.  Keep all follow-up visits as told by your health care provider. This is important. Contact a health care provider if:  You have pain that gets worse.  You have a fever.  You do not have a bowel movement after 4 days.  You vomit.  You are not hungry.  You lose weight.  You are bleeding from the anus.  You have thin, pencil-like stools. Get help right away if:  You have a fever and your symptoms suddenly get worse.  You leak stool or have blood in your stool.  Your abdomen is bloated.  You have severe pain in your abdomen.  You feel dizzy or you faint. This information is not intended to replace advice given to you by your health care provider. Make sure you discuss any questions you have with your health care  provider. Document Released: 11/04/2003 Document Revised: 08/26/2015 Document Reviewed: 07/27/2015 Elsevier Interactive Patient Education  2019 Elsevier Inc.  Endometritis  Endometritis is irritation, soreness, or inflammation that affects the lining of the uterus (endometrium). Infection is usually the cause of endometritis. It is important to get treatment to prevent complications. Common complications may include more severe infections and not being able to have children(infertility). What are the causes? This condition may be caused by:  Bacterial infections.  STIs (sexually transmitted infections).  A miscarriage or childbirth, especially after a long labor or cesarean delivery.  Certain gynecological procedures. These may include dilation and curettage (D&C), hysteroscopy, or birth control (contraceptive) insertion.  Tuberculosis (TB). What are the signs or symptoms? Symptoms of this condition include:  Fever.  Lower abdomen (abdominal) pain.  Pelvis (pelvic) pain.  Abnormal vaginal discharge or bleeding.  Abdominal bloating (distention) or swelling.  General discomfort or generally feeling ill.  Discomfort with bowel movements.  Constipation. How is this diagnosed? This condition may be diagnosed based on:  A physical exam, including a pelvic exam.  Tests, such as: ? Blood tests. ? Removal of a sample of endometrial tissue for testing (endometrial biopsy). ? Examining a sample of vaginal discharge under a microscope (wet prep). ? Removal of a sample of fluid from the cervix for testing (cervical culture). ? Surgical examination of the pelvis and abdomen. How is this treated? This condition is treated with:  Antibiotic medicines.  For more severe cases, hospitalization may  be needed to give fluids and antibiotics directly into a vein through an IV tube. Follow these instructions at home:  Take over-the-counter and prescription medicines only as told by  your health care provider.  Drink enough fluid to keep your urine clear or pale yellow.  Take your antibiotic medicine as told by your health care provider. Do not stop taking the antibiotic even if you start to feel better.  Do not douche or have sex (including vaginal, oral, and anal sex) until your health care provider approves.  If your endometritis was caused by an STI, do not have sex (including vaginal, oral, and anal sex) until your partner has also been treated for the STI.  Return to your normal activities as told by your health care provider. Ask your health care provider what activities are safe for you.  Keep all follow-up visits as told by your health care provider. This is important. Contact a health care provider if:  You have pain that does not get better with medicine.  You have a fever.  You have pain with bowel movements. Get help right away if:  You have abdominal swelling.  You have abdominal pain that gets worse.  You have bad-smelling vaginal discharge, or an increased amount of vaginal discharge.  You have abnormal vaginal bleeding.  You have nausea and vomiting. Summary  Endometritis affects the lining of the uterus (endometrium) and is usually caused by an infection.  It is important to get treatment to prevent complications.  You have several treatment options for endometritis. Treatment may include antibiotics and IV fluids.  Take your antibiotic medicine as told by your health care provider. Do not stop taking the antibiotic even if you start to feel better.  Do not douche or have sex (including vaginal, oral, and anal sex) until your health care provider approves. This information is not intended to replace advice given to you by your health care provider. Make sure you discuss any questions you have with your health care provider. Document Released: 01/30/2001 Document Revised: 02/21/2016 Document Reviewed: 02/21/2016 Elsevier Interactive  Patient Education  2019 Elsevier Inc.  Pregnancy and Urinary Tract Infection What is a urinary tract infection?  A urinary tract infection (UTI) is an infection of any part of the urinary tract. This includes the kidneys, the tubes that connect your kidneys to your bladder (ureters), the bladder, and the tube that carries urine out of your body (urethra). These organs make, store, and get rid of urine in the body.  An upper UTI affects the ureters and kidneys (pyelonephritis), and a lower UTI affects the bladder (cystitis) and urethra (urethritis). Most urinary tract infections are caused by bacteria in your genital area, around the entrance to your urinary tract (urethra). These bacteria grow and cause irritation and inflammation of your urinary tract. Why am I more likely to get a UTI during pregnancy? You are more likely to develop a UTI during pregnancy because:  The physical and hormonal changes your body goes through can make it easier for bacteria to get into your urinary tract.  Your growing baby puts pressure on your uterus and can affect urine flow. Does a UTI place my baby at risk? An untreated UTI during pregnancy could lead to a kidney infection, which can cause health problems that could affect your baby. Possible complications of an untreated UTI include:  Having your baby before 37 weeks of pregnancy (premature).  Having a baby with a low birth weight.  Developing high  blood pressure during pregnancy (preeclampsia).  Having a low hemoglobin level (anemia). What are the symptoms of a UTI? Symptoms of a UTI include:  Needing to urinate right away (urgently).  Frequent urination or passing small amounts of urine frequently.  Pain or burning with urination.  Blood in the urine.  Urine that smells bad or unusual.  Trouble urinating.  Cloudy urine.  Pain in the abdomen or lower back.  Vaginal discharge. You may also have:  Vomiting or a decreased  appetite.  Confusion.  Irritability or tiredness.  A fever.  Diarrhea. What are the treatment options for a UTI during pregnancy? Treatment for this condition may include:  Antibiotic medicines that are safe to take during pregnancy.  Other medicines to treat less common causes of UTI. How can I prevent a UTI? To prevent a UTI:  Go to the bathroom as soon as you feel the need. Do not hold urine for long periods of time.  Always wipe from front to back after a bowel movement. Use each tissue one time when you wipe.  Empty your bladder after sex.  Keep your genital area dry.  Drink 6-10 glasses of water each day.  Do not douche or use deodorant sprays. Contact a health care provider if:  Your symptoms do not improve or they get worse.  You have abnormal vaginal discharge. Get help right away if:  You have a fever.  You have nausea and vomiting.  You have back or side pain.  You feel contractions in your uterus.  You have lower belly pain.  You have a gush of fluid from your vagina.  You have blood in your urine. Summary  A urinary tract infection (UTI) is an infection of any part of the urinary tract, which includes the kidneys, ureters, bladder, and urethra.  Most urinary tract infections are caused by bacteria in your genital area, around the entrance to your urinary tract (urethra).  You are more likely to develop a UTI during pregnancy.  If you were prescribed an antibiotic, take it as told by your health care provider. Do not stop taking the antibiotic even if you start to feel better. This information is not intended to replace advice given to you by your health care provider. Make sure you discuss any questions you have with your health care provider. Document Released: 06/02/2010 Document Revised: 04/02/2017 Document Reviewed: 12/27/2014 Elsevier Interactive Patient Education  [employer]2019 Elsevier Inc.

## 2018-07-27 LAB — URINE CULTURE: Culture: >=100000 — AB

## 2018-07-28 ENCOUNTER — Telehealth (HOSPITAL_COMMUNITY): Payer: Self-pay

## 2018-07-28 NOTE — Telephone Encounter (Signed)
2200 - I returned [name]'s call. She wanted to know how to "dry up" her breast milk. She indicated that she has already spoken with her OB about this. She is having anxiety and no longer wants to breast feed. Amy [name] is using cabbage leaves. She stopped breast feeding on Friday and her last pump was around midnight on 6/7. Her right breast is a little heavy with some perceived clogged ducts. I discussed comfort pumping to relieve plugged ducts PRN and advised that it may take a few days for the body to stop producing. She was pumping about 30 mls before she stopped. She is wearing a sports bra as well. I advised her to try to just comfort pump/gently massage clogged ducts and pump only PRN to avoid mastitis. She will call if she has any further questions or concerns.

## 2018-07-29 MED FILL — hydrOXYzine HCL 25 MG TABS: 25 | 2 days supply | Qty: 20 | Fill #0

## 2018-07-29 MED FILL — SERTRALINE HCL 100 MG TAB: 100 | 30 days supply | Qty: 30 | Fill #0

## 2018-07-30 ENCOUNTER — Encounter: Payer: Self-pay | Admitting: *Deleted

## 2018-07-30 ENCOUNTER — Other Ambulatory Visit: Payer: Self-pay | Admitting: *Deleted

## 2018-07-30 ENCOUNTER — Ambulatory Visit: Payer: Self-pay | Admitting: *Deleted

## 2018-07-30 NOTE — Patient Outreach (Signed)
[facility]) Care Management  07/30/2018  ELLISE KOVACK 1985-09-25 [id number]   Transition of care call  Referral received:07/21/18 Initial outreach:07/25/18 Insurance: [facility] Choice Plan  Unsuccessful telephone call to patient's preferred number in order to complete transition of care assessment; no answer, left HIPAA compliant voicemail message requesting return call.  Objective: Per the electronic medical record, Eboni Coval was hospitalized at  [facility] [place] from 5/30-6/3 for a cesarean section delivery of a abby boy weighing 8 lbs 3.2ozs. Sadia returned to the [facility] on 6/5 for cystitis , endometritis and constipation and was discharged the same day.  Comorbidities include: generalized anxiety disorder, insomnia, obesity, prediabetes and Vit D deficiency.   Plan: This RNCM will attempt another outreach within 4 business days.  Barrington Ellison RN,CCM,CDE [facility] Management Coordinator Office Phone [phone] Office Fax [phone]

## 2018-07-30 NOTE — Patient Outreach (Signed)
[facility]) Care Management  07/30/2018  Amy [name] 08-30-1985 [id number]   Transition of care call  Referral received:07/21/18 Initial outreach:07/25/18 Insurance: [place] returned call to this RNCM, 2 HIPAA identifiers verified and transition of care assessment completed.   Subjective:  Amy [name], an Therapist, sports in the endoscopy suite at [facility],  states she and her baby boy are doing well. She is managing her C section surgical pain with over the counter medications with adequate relief. She says she stopped taking the narcotic analgesic because it was causing severe constipation despite the prescribed interventions she was doing to prevent the constipation. She says she is tolerating food and fluids and she denies bowel and bladder problems. She says her husband, sister and cousins are assisting with her recovery.  She says she has stopped breastfeeding and is giving the baby formula. She says he is spitting up more with the formula than he di when he was breastfeeding.  She says she has completed all medical leave of absence paper work.  She says she does have the hospital indemnity coverage and requests the number for [employer].  She denies any ongoing health issues and says she does not need a referral to the [facility] chronic disease management programs.   Objective: Per the electronic medical record,[facility] delivery of a baby boy, her first child,  weighing 8 lbs 3.2 ozs. Amy [name] returned to the [facility] on 6/5 for c/o fever, abdominal bloating and incisional pain. She reported no bowel movement since before her surgery despite Miralax. She was diagnosed with cystitis , endometritis and constipation and was discharged the same day.Comorbidities include: generalized anxiety disorder, insomnia, obesity, prediabetes and  Vit D deficiency  Assessment:  Patient voices good understanding of all discharge instructions.  See transition of care flowsheet for assessment details.  Plan:  Provided phone number for [employer] so that Amy [name] can start her hospital indemnity claim.  Reviewed hospital discharge diagnosis of cystitis, endometritis, and recovery from  cesarean section treatment plan using hospital discharge instructions, assessing medication adherence,  reviewing postoperative problems requiring provider notification, and discussing the importance of follow up with surgeon, primary care provider and specialists. Reviewed 's Active Health Management 2020 Wellness Requirements of: Completing the computerized Health Assessment and the Health Action Step with Active Health Management [facility]) by October 21 2018 AND have an annual physical between February 19, 2017 and October 21, 2018 in order to qualify for the 2021 Healthy Lifestyle Premium No ongoing care management needs identified so will close case to [facility] care management services.  Barrington Ellison RN,CCM,CDE [place] Management Coordinator Office Phone [phone] Office Fax [phone]

## 2018-08-04 ENCOUNTER — Ambulatory Visit: Payer: Self-pay | Admitting: *Deleted

## 2018-08-28 MED FILL — SERTRALINE HCL 100 MG TABS: 100 | 30 days supply | Qty: 30 | Fill #1

## 2018-09-04 MED FILL — SETLAKIN 0.15 MG-0.03 MG TA: 0.15-0.03 | 91 days supply | Qty: 91 | Fill #0

## 2018-09-09 DIAGNOSIS — M79642 Pain in left hand: Secondary | ICD-10-CM | POA: Diagnosis not present

## 2018-09-09 DIAGNOSIS — M654 Radial styloid tenosynovitis [de Quervain]: Secondary | ICD-10-CM | POA: Diagnosis not present

## 2018-09-16 ENCOUNTER — Ambulatory Visit: Payer: 59 | Admitting: Family Medicine

## 2018-09-29 MED FILL — SERTRALINE HCL 100 MG TAB: 100 | 30 days supply | Qty: 30 | Fill #0

## 2018-09-29 MED FILL — VALACYCLOVIR HCL 500 MG TAB: 500 | 90 days supply | Qty: 180 | Fill #1

## 2018-10-09 DIAGNOSIS — K120 Recurrent oral aphthae: Secondary | ICD-10-CM | POA: Diagnosis not present

## 2018-11-11 DIAGNOSIS — M654 Radial styloid tenosynovitis [de Quervain]: Secondary | ICD-10-CM | POA: Diagnosis not present

## 2018-11-13 MED FILL — hydrOXYzine HCL 25 MG TABS: 25 | 2 days supply | Qty: 20 | Fill #1

## 2018-11-13 MED FILL — SERTRALINE HCL 100 MG TAB: 100 | 30 days supply | Qty: 30 | Fill #0

## 2018-11-18 ENCOUNTER — Telehealth: Payer: Self-pay | Admitting: Nurse Practitioner

## 2018-11-18 NOTE — Telephone Encounter (Signed)
I called and left message on patient voicemail to call office and reschedule appointment. Amy [name] will not be in the office on 11/20/2018, she only works remotely. Patient can reschedule for any day except Thursdays.  °

## 2018-11-20 ENCOUNTER — Encounter: Payer: 59 | Admitting: Nurse Practitioner

## 2018-11-27 DIAGNOSIS — M654 Radial styloid tenosynovitis [de Quervain]: Secondary | ICD-10-CM | POA: Diagnosis not present

## 2018-11-27 DIAGNOSIS — R2231 Localized swelling, mass and lump, right upper limb: Secondary | ICD-10-CM | POA: Diagnosis not present

## 2018-11-27 DIAGNOSIS — M79641 Pain in right hand: Secondary | ICD-10-CM | POA: Diagnosis not present

## 2018-12-05 ENCOUNTER — Other Ambulatory Visit: Payer: Self-pay

## 2018-12-05 ENCOUNTER — Ambulatory Visit (INDEPENDENT_AMBULATORY_CARE_PROVIDER_SITE_OTHER): Payer: 59 | Admitting: Nurse Practitioner

## 2018-12-05 ENCOUNTER — Encounter: Payer: Self-pay | Admitting: Nurse Practitioner

## 2018-12-05 VITALS — BP 100/70 | HR 96 | Temp 98.3°F | Ht 64.0 in | Wt 216.0 lb

## 2018-12-05 DIAGNOSIS — R7303 Prediabetes: Secondary | ICD-10-CM

## 2018-12-05 DIAGNOSIS — F411 Generalized anxiety disorder: Secondary | ICD-10-CM

## 2018-12-05 DIAGNOSIS — E559 Vitamin D deficiency, unspecified: Secondary | ICD-10-CM | POA: Diagnosis not present

## 2018-12-05 DIAGNOSIS — E782 Mixed hyperlipidemia: Secondary | ICD-10-CM

## 2018-12-05 DIAGNOSIS — F5102 Adjustment insomnia: Secondary | ICD-10-CM

## 2018-12-05 DIAGNOSIS — D500 Iron deficiency anemia secondary to blood loss (chronic): Secondary | ICD-10-CM | POA: Diagnosis not present

## 2018-12-05 DIAGNOSIS — Z0001 Encounter for general adult medical examination with abnormal findings: Secondary | ICD-10-CM

## 2018-12-05 LAB — COMPREHENSIVE METABOLIC PANEL
ALT: 22 U/L (ref 0–35)
AST: 18 U/L (ref 0–37)
Albumin: 4.2 g/dL (ref 3.5–5.2)
Alkaline Phosphatase: 89 U/L (ref 39–117)
BUN: 14 mg/dL (ref 6–23)
CO2: 25 mEq/L (ref 19–32)
Calcium: 9.4 mg/dL (ref 8.4–10.5)
Chloride: 103 mEq/L (ref 96–112)
Creatinine, Ser: 0.68 mg/dL (ref 0.40–1.20)
GFR: 121.73 mL/min (ref >60.00)
Glucose, Bld: 86 mg/dL (ref 70–99)
Potassium: 4.2 mEq/L (ref 3.5–5.1)
Sodium: 136 mEq/L (ref 135–145)
Total Bilirubin: 0.3 mg/dL (ref 0.2–1.2)
Total Protein: 8.2 g/dL (ref 6.0–8.3)

## 2018-12-05 LAB — CBC WITH DIFFERENTIAL/PLATELET
Basophils Absolute: 0 10*3/uL (ref 0.0–0.1)
Basophils Relative: 0.3 % (ref 0.0–3.0)
Eosinophils Absolute: 0.1 10*3/uL (ref 0.0–0.7)
Eosinophils Relative: 1.1 % (ref 0.0–5.0)
HCT: 38.2 % (ref 36.0–46.0)
Hemoglobin: 12.5 g/dL (ref 12.0–15.0)
Lymphocytes Relative: 34.4 % (ref 12.0–46.0)
Lymphs Abs: 2.7 10*3/uL (ref 0.7–4.0)
MCHC: 32.7 g/dL (ref 30.0–36.0)
MCV: 81.8 fl (ref 78.0–100.0)
Monocytes Absolute: 0.5 10*3/uL (ref 0.1–1.0)
Monocytes Relative: 6.8 % (ref 3.0–12.0)
Neutro Abs: 4.5 10*3/uL (ref 1.4–7.7)
Neutrophils Relative %: 57.4 % (ref 43.0–77.0)
Platelets: 333 10*3/uL (ref 150.0–400.0)
RBC: 4.67 Mil/uL (ref 3.87–5.11)
RDW: 16.2 % — ABNORMAL HIGH (ref 11.5–15.5)
WBC: 7.8 10*3/uL (ref 4.0–10.5)

## 2018-12-05 LAB — LIPID PANEL
Cholesterol: 262 mg/dL — ABNORMAL HIGH (ref 0–200)
HDL: 50.6 mg/dL (ref >39.00)
LDL Cholesterol: 195 mg/dL — ABNORMAL HIGH (ref 0–99)
NonHDL: 211.25
Total CHOL/HDL Ratio: 5
Triglycerides: 81 mg/dL (ref 0.0–149.0)
VLDL: 16.2 mg/dL (ref 0.0–40.0)

## 2018-12-05 LAB — HEMOGLOBIN A1C: Hgb A1c MFr Bld: 6.1 % (ref 4.6–6.5)

## 2018-12-05 LAB — TSH: TSH: 0.89 u[IU]/mL (ref 0.35–4.50)

## 2018-12-05 MED ORDER — HYDROXYZINE HCL 25 MG PO TABS: 25.0000 mg | ORAL_TABLET | Freq: Every day | ORAL | 1 refills | Status: DC

## 2018-12-05 MED FILL — hydrOXYzine HCL 25 MG TABS: 25 | 30 days supply | Qty: 60 | Fill #0

## 2018-12-05 NOTE — Patient Instructions (Addendum)
Normal CMP and TSH HgbA1c indicate prediabetes. Abnormal lipid panel: elevated TC and LDL.  Work on heart healthy diet and regular exercise. Need to repeat labs in 39months (fasting) Pending Vitamin D  Maintain appts with therapist.  Discuss contraception options with GYN.  Health Maintenance, Female Adopting a healthy lifestyle and getting preventive care are important in promoting health and wellness. Ask your health care provider about:  The right schedule for you to have regular tests and exams.  Things you can do on your own to prevent diseases and keep yourself healthy. What should I know about diet, weight, and exercise? Eat a healthy diet   Eat a diet that includes plenty of vegetables, fruits, low-fat dairy products, and lean protein.  Do not eat a lot of foods that are high in solid fats, added sugars, or sodium. Maintain a healthy weight Body mass index (BMI) is used to identify weight problems. It estimates body fat based on height and weight. Your health care provider can help determine your BMI and help you achieve or maintain a healthy weight. Get regular exercise Get regular exercise. This is one of the most important things you can do for your health. Most adults should:  Exercise for at least 150 minutes each week. The exercise should increase your heart rate and make you sweat (moderate-intensity exercise).  Do strengthening exercises at least twice a week. This is in addition to the moderate-intensity exercise.  Spend less time sitting. Even light physical activity can be beneficial. Watch cholesterol and blood lipids Have your blood tested for lipids and cholesterol at 31 years of age, then have this test every 5 years. Have your cholesterol levels checked more often if:  Your lipid or cholesterol levels are high.  You are older than 31 years of age.  You are at high risk for heart disease. What should I know about cancer screening? Depending on your  health history and family history, you may need to have cancer screening at various ages. This may include screening for:  Breast cancer.  Cervical cancer.  Colorectal cancer.  Skin cancer.  Lung cancer. What should I know about heart disease, diabetes, and high blood pressure? Blood pressure and heart disease  High blood pressure causes heart disease and increases the risk of stroke. This is more likely to develop in people who have high blood pressure readings, are of African descent, or are overweight.  Have your blood pressure checked: ? Every 3-5 years if you are 32-28 years of age. ? Every year if you are 26 years old or older. Diabetes Have regular diabetes screenings. This checks your fasting blood sugar level. Have the screening done:  Once every three years after age 42 if you are at a normal weight and have a low risk for diabetes.  More often and at a younger age if you are overweight or have a high risk for diabetes. What should I know about preventing infection? Hepatitis B If you have a higher risk for hepatitis B, you should be screened for this virus. Talk with your health care provider to find out if you are at risk for hepatitis B infection. Hepatitis C Testing is recommended for:  Everyone born from [birth year] through 1963.  Anyone with known risk factors for hepatitis C. Sexually transmitted infections (STIs)  Get screened for STIs, including gonorrhea and chlamydia, if: ? You are sexually active and are younger than 31 years of age. ? You are older than 31 years of  age and your health care provider tells you that you are at risk for this type of infection. ? Your sexual activity has changed since you were last screened, and you are at increased risk for chlamydia or gonorrhea. Ask your health care provider if you are at risk.  Ask your health care provider about whether you are at high risk for HIV. Your health care provider may recommend a prescription  medicine to help prevent HIV infection. If you choose to take medicine to prevent HIV, you should first get tested for HIV. You should then be tested every 3 months for as long as you are taking the medicine. Pregnancy  If you are about to stop having your period (premenopausal) and you may become pregnant, seek counseling before you get pregnant.  Take 400 to 800 micrograms (mcg) of folic acid every day if you become pregnant.  Ask for birth control (contraception) if you want to prevent pregnancy. Osteoporosis and menopause Osteoporosis is a disease in which the bones lose minerals and strength with aging. This can result in bone fractures. If you are 20 years old or older, or if you are at risk for osteoporosis and fractures, ask your health care provider if you should:  Be screened for bone loss.  Take a calcium or vitamin D supplement to lower your risk of fractures.  Be given hormone replacement therapy (HRT) to treat symptoms of menopause. Follow these instructions at home: Lifestyle  Do not use any products that contain nicotine or tobacco, such as cigarettes, e-cigarettes, and chewing tobacco. If you need help quitting, ask your health care provider.  Do not use street drugs.  Do not share needles.  Ask your health care provider for help if you need support or information about quitting drugs. Alcohol use  Do not drink alcohol if: ? Your health care provider tells you not to drink. ? You are pregnant, may be pregnant, or are planning to become pregnant.  If you drink alcohol: ? Limit how much you use to 0-1 drink a day. ? Limit intake if you are breastfeeding.  Be aware of how much alcohol is in your drink. In the [place] one drink equals one 12 oz bottle of beer (355 mL), one 5 oz glass of wine (148 mL), or one 1 oz glass of hard liquor (44 mL). General instructions  Schedule regular health, dental, and eye exams.  Stay current with your vaccines.  Tell your health  care provider if: ? You often feel depressed. ? You have ever been abused or do not feel safe at home. Summary  Adopting a healthy lifestyle and getting preventive care are important in promoting health and wellness.  Follow your health care provider's instructions about healthy diet, exercising, and getting tested or screened for diseases.  Follow your health care provider's instructions on monitoring your cholesterol and blood pressure. This information is not intended to replace advice given to you by your health care provider. Make sure you discuss any questions you have with your health care provider. Document Released: 08/21/2010 Document Revised: 01/29/2018 Document Reviewed: 01/29/2018 Elsevier Patient Education  2020 [employer].

## 2018-12-05 NOTE — Progress Notes (Signed)
Subjective:    Patient ID: Amy [name], female    DOB: [birth year], 33 y.o.   MRN: [id number]  Patient presents today for complete physical and eval of anxiety/depression.  HPI  Sexual History (orientation,birth control, marital status, STD):married, sexually active with husband, has 4months old son, not breastfeeding.  Home Safety:home with husband, feels safe in relationship, has support from family and husband.  Depression/Suicide: worsening with childbirth. Current use of zoloft with moderate improvement. She is still to make appt with therapist. Depression screen [facility] 2/9 12/05/2018 12/05/2018 02/28/2017 11/28/2016  Decreased Interest 2 3 0 0  Down, Depressed, Hopeless 2 3 0 0  PHQ - 2 Score 4 6 0 0  Altered sleeping 3 - - -  Tired, decreased energy 2 - - -  Change in appetite 3 - - -  Feeling bad or failure about yourself  1 - - -  Trouble concentrating 1 - - -  Moving slowly or fidgety/restless 0 - - -  Suicidal thoughts 0 - - -  PHQ-9 Score 14 - - -   GAD 7 : Generalized Anxiety Score 12/05/2018  Nervous, Anxious, on Edge 2  Control/stop worrying 1  Worry too much - different things 2  Trouble relaxing 2  Restless 0  Easily annoyed or irritable 0  Afraid - awful might happen 1  Total GAD 7 Score 8   Vision:up to date  Dental:up to date  Immunizations: (TDAP, Hep C screen, Pneumovax, Influenza, zoster): up to date with PAP, done by GYN Health Maintenance  Topic Date Due  . Pap Smear  11/21/2017  . Tetanus Vaccine  10/23/2026  . Flu Shot  Completed  . HIV Screening  Completed   Diet:regular  Weight:  Wt Readings from Last 3 Encounters:  12/05/18 216 lb (98 kg)  07/24/18 217 lb 6 oz (98.6 kg)  07/19/18 221 lb 9 oz (100.5 kg)   Exercise:not at this time  Fall Risk: Fall Risk  12/05/2018 02/28/2017 11/28/2016  Falls in the past year? 0 Yes No  Number falls in past yr: - 1 -  Injury with Fall? - Yes -  Comment - knee injury -   Advanced Directive:  Advanced Directives 07/24/2018  Does Patient Have a Medical Advance Directive? No  Would patient like information on creating a medical advance directive? No - Patient declined    Medications and allergies reviewed with patient and updated if appropriate.  Patient Active Problem List   Diagnosis Date Noted  . Mixed hyperlipidemia 12/07/2018  . Vitamin D deficiency 03/04/2017  . Obesity (BMI 35.0-39.9 without comorbidity) 03/04/2017  . 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 Prediabetes 05/31/2016  . Insomnia 11/04/2015  . Generalized anxiety disorder 11/04/2015    Current Outpatient Medications on File Prior to Visit  Medication Sig Dispense Refill  . ibuprofen (ADVIL) 600 MG tablet Take 1 tablet (600 mg total) by mouth every 6 (six) hours as needed. 30 tablet 0  . sertraline (ZOLOFT) 50 MG tablet Take 1 tablet (50 mg total) by mouth at bedtime. 30 tablet 6  . valACYclovir (VALTREX) 500 MG tablet TAKE 1 TABLET BY MOUTH ONCE DAILY (Patient taking differently: 2 (two) times daily. ) 90 tablet 1  . polyethylene glycol (MIRALAX / GLYCOLAX) 17 g packet Take 17 g by mouth daily.    . Prenatal Vit-Fe Fumarate-FA (MULTIVITAMIN-PRENATAL) 27-0.8 MG TABS tablet Take 1 tablet by mouth daily at 12 noon.     No current facility-administered medications on file  prior to visit.     Past Medical History:  Diagnosis Date  . Anxiety    with flying only  . GERD (gastroesophageal reflux disease)   . Herpes   . Vaginal Pap smear, abnormal     Past Surgical History:  Procedure Laterality Date  . CERVICAL BIOPSY  W/ LOOP ELECTRODE EXCISION    . CESAREAN SECTION N/A 07/20/2018   Procedure: CESAREAN SECTION;  Surgeon: Essie Hart, MD;  Location: [facility] LD ORS;  Service: Obstetrics;  Laterality: N/A;  . WISDOM TOOTH EXTRACTION      Social History   Socioeconomic History  . Marital status: Married    Spouse name: Not on file  . Number of children: Not on file  . Years of education: Not on file   . Highest education level: Not on file  Occupational History  . Occupation: Teacher, adult education: [employer]    Comment: GI/endo at [employer]  Social Needs  . Financial resource strain: Not hard at all  . Food insecurity    Worry: Never true    Inability: Never true  . Transportation needs    Medical: No    Non-medical: Not on file  Tobacco Use  . Smoking status: Never Smoker  . Smokeless tobacco: Never Used  Substance and Sexual Activity  . Alcohol use: Not Currently    Comment: social  . Drug use: No  . Sexual activity: Yes    Birth control/protection: Condom  Lifestyle  . Physical activity    Days per week: Not on file    Minutes per session: Not on file  . Stress: Only a little  Relationships  . Social Musician on phone: Not on file    Gets together: Not on file    Attends religious service: Not on file    Active member of club or organization: Not on file    Attends meetings of clubs or organizations: Not on file    Relationship status: Not on file  Other Topics Concern  . Not on file  Social History Narrative  . Not on file    Family History  Problem Relation Age of Onset  . Hypertension Mother   . Hyperlipidemia Mother   . Hyperlipidemia Father   . Alcohol abuse Father   . Cirrhosis Father   . Hypertension Maternal Aunt   . Heart disease Paternal Grandmother         Review of Systems  Constitutional: Negative for fever, malaise/fatigue and weight loss.  HENT: Negative for congestion and sore throat.   Eyes:       Negative for visual changes  Respiratory: Negative for cough and shortness of breath.   Cardiovascular: Negative for chest pain, palpitations and leg swelling.  Gastrointestinal: Negative for blood in stool, constipation, diarrhea and heartburn.  Genitourinary: Negative for dysuria, frequency and urgency.  Musculoskeletal: Negative for falls, joint pain and myalgias.  Skin: Negative for rash.  Neurological: Negative for dizziness,  sensory change and headaches.  Endo/Heme/Allergies: Does not bruise/bleed easily.  Psychiatric/Behavioral: Positive for depression. Negative for substance abuse and suicidal ideas. The patient is nervous/anxious and has insomnia.     Objective:   Vitals:   12/05/18 1013  BP: 100/70  Pulse: 96  Temp: 98.3 F (36.8 C)  SpO2: 98%    Body mass index is 37.08 kg/m.   Physical Examination:  Physical Exam Vitals signs reviewed.  Constitutional:      General: She is not  in acute distress.    Appearance: She is well-developed. She is obese.  HENT:     Head: Normocephalic.     Right Ear: Tympanic membrane, ear canal and external ear normal.     Left Ear: Tympanic membrane, ear canal and external ear normal.  Eyes:     Conjunctiva/sclera: Conjunctivae normal.     Pupils: Pupils are equal, round, and reactive to light.  Neck:     Musculoskeletal: Normal range of motion and neck supple.  Cardiovascular:     Rate and Rhythm: Normal rate and regular rhythm.     Pulses: Normal pulses.     Heart sounds: Normal heart sounds.  Pulmonary:     Effort: Pulmonary effort is normal. No respiratory distress.     Breath sounds: Normal breath sounds.  Chest:     Chest wall: No tenderness.  Abdominal:     General: Bowel sounds are normal.     Palpations: Abdomen is soft.  Genitourinary:    Comments: Deferred pelvic and breast exam to GYN Musculoskeletal: Normal range of motion.  Lymphadenopathy:     Cervical: No cervical adenopathy.  Neurological:     Mental Status: She is alert and oriented to person, place, and time.     Deep Tendon Reflexes: Reflexes are normal and symmetric.  Psychiatric:        Mood and Affect: Mood normal.        Behavior: Behavior normal.        Thought Content: Thought content normal.    ASSESSMENT and PLAN:  Renay was seen today for annual exam.  Diagnoses and all orders for this visit:  Encounter for preventative adult health care exam with abnormal  findings -     Comprehensive metabolic panel -     TSH -     Lipid panel  Mixed hyperlipidemia -     Lipid panel  Vitamin D deficiency -     Vitamin D 1,25 dihydroxy  Generalized anxiety disorder -     hydrOXYzine (ATARAX/VISTARIL) 25 MG tablet; Take 1-2 tablets (25-50 mg total) by mouth at bedtime.  Prediabetes -     Hemoglobin A1c  Iron deficiency anemia due to chronic blood loss -     CBC w/Diff  Adjustment insomnia   Generalized anxiety disorder Improving with use of zoloft, vistaril and counseling. Has support from husband and family. F/up in 74month  Mixed hyperlipidemia Encourage heart healthy diet and exercise at this time. Repeat lipid panel in 6months (fasting)  Insomnia Due to increase anxiety and depression Vistaril rx sent. F/up in 74month   Prediabetes hgbA1c of 6.1 Advised about need for heart healthy diet and regular exercise. F/up in 6months     Problem List Items Addressed This Visit      Other   Generalized anxiety disorder    Improving with use of zoloft, vistaril and counseling. Has support from husband and family. F/up in 74month      Relevant Medications   hydrOXYzine (ATARAX/VISTARIL) 25 MG tablet   Insomnia    Due to increase anxiety and depression Vistaril rx sent. F/up in 74month       Mixed hyperlipidemia    Encourage heart healthy diet and exercise at this time. Repeat lipid panel in 6months (fasting)      Relevant Orders   Lipid panel (Completed)   Prediabetes    hgbA1c of 6.1 Advised about need for heart healthy diet and regular exercise. F/up in 6months  Relevant Orders   Hemoglobin A1c (Completed)   Vitamin D deficiency   Relevant Orders   Vitamin D 1,25 dihydroxy    Other Visit Diagnoses    Encounter for preventative adult health care exam with abnormal findings    -  Primary   Relevant Orders   Comprehensive metabolic panel (Completed)   TSH (Completed)   Lipid panel (Completed)   Iron  deficiency anemia due to chronic blood loss       Relevant Orders   CBC w/Diff (Completed)      Follow up: Return in about 4 weeks (around 01/02/2019) for anxiety and depression ( ).  Alysia Penna, NP

## 2018-12-07 ENCOUNTER — Encounter: Payer: Self-pay | Admitting: Nurse Practitioner

## 2018-12-07 DIAGNOSIS — E782 Mixed hyperlipidemia: Secondary | ICD-10-CM | POA: Insufficient documentation

## 2018-12-07 NOTE — Assessment & Plan Note (Signed)
Improving with use of zoloft, vistaril and counseling. Has support from husband and family. F/up in 64month

## 2018-12-07 NOTE — Assessment & Plan Note (Addendum)
hgbA1c of 6.1 Advised about need for heart healthy diet and regular exercise. F/up in 61months

## 2018-12-07 NOTE — Assessment & Plan Note (Signed)
Due to increase anxiety and depression Vistaril rx sent. F/up in 51month

## 2018-12-07 NOTE — Assessment & Plan Note (Signed)
Encourage heart healthy diet and exercise at this time. Repeat lipid panel in 71months (fasting)

## 2018-12-10 ENCOUNTER — Encounter: Payer: Self-pay | Admitting: Nurse Practitioner

## 2018-12-10 LAB — VITAMIN D 1,25 DIHYDROXY
Vitamin D 1, 25 (OH)2 Total: 34 pg/mL (ref 18–72)
Vitamin D2 1, 25 (OH)2: <8 pg/mL
Vitamin D3 1, 25 (OH)2: 34 pg/mL

## 2018-12-10 NOTE — Progress Notes (Signed)
Abstracted result and sent to scan  

## 2018-12-26 DIAGNOSIS — Z30017 Encounter for initial prescription of implantable subdermal contraceptive: Secondary | ICD-10-CM | POA: Diagnosis not present

## 2018-12-26 DIAGNOSIS — F419 Anxiety disorder, unspecified: Secondary | ICD-10-CM | POA: Diagnosis not present

## 2018-12-26 DIAGNOSIS — Z3009 Encounter for other general counseling and advice on contraception: Secondary | ICD-10-CM | POA: Diagnosis not present

## 2018-12-26 DIAGNOSIS — Z3202 Encounter for pregnancy test, result negative: Secondary | ICD-10-CM | POA: Diagnosis not present

## 2018-12-26 MED FILL — SERTRALINE HCL 100 MG TAB: 100 | 30 days supply | Qty: 30 | Fill #1

## 2018-12-26 MED FILL — SERTRALINE HCL 50 MG TABLET: 50 | 30 days supply | Qty: 30 | Fill #1

## 2018-12-29 MED FILL — hydrOXYzine HCL 25 MG TABS: 25 | 4 days supply | Qty: 30 | Fill #0

## 2019-01-05 ENCOUNTER — Encounter: Payer: Self-pay | Admitting: Nurse Practitioner

## 2019-01-05 ENCOUNTER — Ambulatory Visit: Payer: 59 | Admitting: Nurse Practitioner

## 2019-01-05 ENCOUNTER — Other Ambulatory Visit: Payer: Self-pay

## 2019-01-05 VITALS — BP 112/82 | HR 95 | Temp 97.4°F | Ht 64.0 in | Wt 223.4 lb

## 2019-01-05 DIAGNOSIS — F5102 Adjustment insomnia: Secondary | ICD-10-CM

## 2019-01-05 DIAGNOSIS — F411 Generalized anxiety disorder: Secondary | ICD-10-CM

## 2019-01-05 MED ORDER — SERTRALINE HCL 50 MG PO TABS: 50.0000 mg | ORAL_TABLET | Freq: Every day | ORAL | 1 refills | Status: DC

## 2019-01-05 NOTE — Progress Notes (Signed)
Subjective:  Patient ID: Amy [name], female    DOB: May 03, 1985  Age: 33 y.o. MRN: [id number]  CC: Follow-up (follow up on anxiety and depression/)  HPI   Anxiety and Depression: Improved mood with zoloft. Use of vistaril prn at bedtime. Did nto schedule appt with therapist. States support from family and friends is helpful at this time. Depression screen [facility] 2/9 12/05/2018 12/05/2018 02/28/2017  Decreased Interest 2 3 0  Down, Depressed, Hopeless 2 3 0  PHQ - 2 Score 4 6 0  Altered sleeping 3 - -  Tired, decreased energy 2 - -  Change in appetite 3 - -  Feeling bad or failure about yourself  1 - -  Trouble concentrating 1 - -  Moving slowly or fidgety/restless 0 - -  Suicidal thoughts 0 - -  PHQ-9 Score 14 - -   Reviewed past Medical, Social and Family history today.  Outpatient Medications Prior to Visit  Medication Sig Dispense Refill  . Etonogestrel (NEXPLANON [place]) Inject into the skin. 3 years    . hydrOXYzine (ATARAX/VISTARIL) 25 MG tablet Take 1-2 tablets (25-50 mg total) by mouth at bedtime. 60 tablet 1  . valACYclovir (VALTREX) 500 MG tablet TAKE 1 TABLET BY MOUTH ONCE DAILY (Patient taking differently: 2 (two) times daily. ) 90 tablet 1  . ibuprofen (ADVIL) 600 MG tablet Take 1 tablet (600 mg total) by mouth every 6 (six) hours as needed. 30 tablet 0  . sertraline (ZOLOFT) 50 MG tablet Take 1 tablet (50 mg total) by mouth at bedtime. 30 tablet 6  . polyethylene glycol (MIRALAX / GLYCOLAX) 17 g packet Take 17 g by mouth daily.    . Prenatal Vit-Fe Fumarate-FA (MULTIVITAMIN-PRENATAL) 27-0.8 MG TABS tablet Take 1 tablet by mouth daily at 12 noon.     No facility-administered medications prior to visit.     ROS See HPI  Objective:  BP 112/82   Pulse 95   Temp (!) 97.4 F (36.3 C) (Tympanic)   Ht 5\' 4"  (1.626 m)   Wt 223 lb 6.4 oz (101.3 kg)   SpO2 99%   BMI 38.35 kg/m   BP Readings from Last 3 Encounters:  01/05/19 112/82  12/05/18 100/70  07/25/18  114/77    Wt Readings from Last 3 Encounters:  01/05/19 223 lb 6.4 oz (101.3 kg)  12/05/18 216 lb (98 kg)  07/24/18 217 lb 6 oz (98.6 kg)    Physical Exam Vitals signs reviewed.  Cardiovascular:     Rate and Rhythm: Normal rate.  Neurological:     Mental Status: She is alert and oriented to person, place, and time.  Psychiatric:        Mood and Affect: Mood normal.        Behavior: Behavior normal.        Thought Content: Thought content normal.    Lab Results  Component Value Date   WBC 7.8 12/05/2018   HGB 12.5 12/05/2018   HCT 38.2 12/05/2018   PLT 333.0 12/05/2018   GLUCOSE 86 12/05/2018   CHOL 262 (H) 12/05/2018   TRIG 81.0 12/05/2018   HDL 50.60 12/05/2018   LDLCALC 195 (H) 12/05/2018   ALT 22 12/05/2018   AST 18 12/05/2018   NA 136 12/05/2018   K 4.2 12/05/2018   CL 103 12/05/2018   CREATININE 0.68 12/05/2018   BUN 14 12/05/2018   CO2 25 12/05/2018   TSH 0.89 12/05/2018   HGBA1C 6.1 12/05/2018    Assessment &  Plan:   Amy [name] was seen today for follow-up.  Diagnoses and all orders for this visit:  Generalized anxiety disorder -     sertraline (ZOLOFT) 50 MG tablet; Take 1 tablet (50 mg total) by mouth at bedtime.  Adjustment insomnia -     sertraline (ZOLOFT) 50 MG tablet; Take 1 tablet (50 mg total) by mouth at bedtime.   I have discontinued Miasha V. Rocha's ibuprofen. I am also having her maintain her valACYclovir, multivitamin-prenatal, polyethylene glycol, hydrOXYzine, Etonogestrel (NEXPLANON [place]), and sertraline.  Meds ordered this encounter  Medications  . sertraline (ZOLOFT) 50 MG tablet    Sig: Take 1 tablet (50 mg total) by mouth at bedtime.    Dispense:  90 tablet    Refill:  1    Order Specific Question:   Supervising Provider    Answer:   Dianne Dun [[id number]]    Problem List Items Addressed This Visit      Other   Generalized anxiety disorder - Primary   Relevant Medications   sertraline (ZOLOFT) 50 MG tablet   Insomnia    Relevant Medications   sertraline (ZOLOFT) 50 MG tablet       Follow-up: Return in about 3 months (around 04/07/2019) for anxiety and depression, hyperlipidemia (fasting).  Alysia Penna, NP

## 2019-02-05 DIAGNOSIS — M25531 Pain in right wrist: Secondary | ICD-10-CM | POA: Diagnosis not present

## 2019-02-06 MED FILL — hydrOXYzine HCL 25 MG TABS: 25 | 4 days supply | Qty: 30 | Fill #0

## 2019-02-06 MED FILL — SERTRALINE HCL 50 MG TABLET: 50 | 90 days supply | Qty: 90 | Fill #0

## 2019-02-06 MED FILL — SERTRALINE HCL 100 MG TAB: 100 | 90 days supply | Qty: 90 | Fill #0

## 2019-02-19 DIAGNOSIS — J3489 Other specified disorders of nose and nasal sinuses: Secondary | ICD-10-CM | POA: Diagnosis not present

## 2019-02-19 DIAGNOSIS — Z20828 Contact with and (suspected) exposure to other viral communicable diseases: Secondary | ICD-10-CM | POA: Diagnosis not present

## 2019-03-19 ENCOUNTER — Ambulatory Visit (INDEPENDENT_AMBULATORY_CARE_PROVIDER_SITE_OTHER): Payer: 59 | Admitting: Licensed Clinical Social Worker

## 2019-03-19 DIAGNOSIS — F3341 Major depressive disorder, recurrent, in partial remission: Secondary | ICD-10-CM | POA: Diagnosis not present

## 2019-04-15 ENCOUNTER — Ambulatory Visit (INDEPENDENT_AMBULATORY_CARE_PROVIDER_SITE_OTHER): Payer: 59 | Admitting: Licensed Clinical Social Worker

## 2019-04-15 DIAGNOSIS — F324 Major depressive disorder, single episode, in partial remission: Secondary | ICD-10-CM

## 2019-04-15 DIAGNOSIS — Z20828 Contact with and (suspected) exposure to other viral communicable diseases: Secondary | ICD-10-CM | POA: Diagnosis not present

## 2019-04-16 DIAGNOSIS — Z975 Presence of (intrauterine) contraceptive device: Secondary | ICD-10-CM | POA: Diagnosis not present

## 2019-04-17 DIAGNOSIS — Z03818 Encounter for observation for suspected exposure to other biological agents ruled out: Secondary | ICD-10-CM | POA: Diagnosis not present

## 2019-04-17 DIAGNOSIS — Z20828 Contact with and (suspected) exposure to other viral communicable diseases: Secondary | ICD-10-CM | POA: Diagnosis not present

## 2019-05-13 ENCOUNTER — Ambulatory Visit (INDEPENDENT_AMBULATORY_CARE_PROVIDER_SITE_OTHER): Payer: 59 | Admitting: Licensed Clinical Social Worker

## 2019-05-13 DIAGNOSIS — F324 Major depressive disorder, single episode, in partial remission: Secondary | ICD-10-CM

## 2019-06-05 DIAGNOSIS — Z0279 Encounter for issue of other medical certificate: Secondary | ICD-10-CM

## 2019-06-18 MED FILL — SERTRALINE HCL 50 MG TABS: 50 | 90 days supply | Qty: 90 | Fill #1

## 2019-06-18 MED FILL — SERTRALINE HCL 100 MG TAB: 100 | 90 days supply | Qty: 90 | Fill #1

## 2019-07-01 ENCOUNTER — Other Ambulatory Visit: Payer: Self-pay

## 2019-07-02 ENCOUNTER — Encounter: Payer: Self-pay | Admitting: Nurse Practitioner

## 2019-07-02 ENCOUNTER — Ambulatory Visit: Payer: 59 | Admitting: Nurse Practitioner

## 2019-07-02 ENCOUNTER — Other Ambulatory Visit: Payer: Self-pay | Admitting: Nurse Practitioner

## 2019-07-02 VITALS — BP 128/68 | HR 87 | Temp 97.0°F | Ht 64.0 in | Wt 233.8 lb

## 2019-07-02 DIAGNOSIS — F332 Major depressive disorder, recurrent severe without psychotic features: Secondary | ICD-10-CM | POA: Diagnosis not present

## 2019-07-02 MED ORDER — BUPROPION HCL 100 MG PO TABS: 100.0000 mg | ORAL_TABLET | Freq: Two times a day (BID) | ORAL | 5 refills | Status: DC

## 2019-07-02 NOTE — Patient Instructions (Signed)
Stop zoloft Start wellbutrin  Start daily exercise and heart healthy diet.  Calorie Counting for Weight Loss Calories are units of energy. Your body needs a certain amount of calories from food to keep you going throughout the day. When you eat more calories than your body needs, your body stores the extra calories as fat. When you eat fewer calories than your body needs, your body burns fat to get the energy it needs. Calorie counting means keeping track of how many calories you eat and drink each day. Calorie counting can be helpful if you need to lose weight. If you make sure to eat fewer calories than your body needs, you should lose weight. Ask your health care provider what a healthy weight is for you. For calorie counting to work, you will need to eat the right number of calories in a day in order to lose a healthy amount of weight per week. A dietitian can help you determine how many calories you need in a day and will give you suggestions on how to reach your calorie goal.  A healthy amount of weight to lose per week is usually 1-2 lb (0.5-0.9 kg). This usually means that your daily calorie intake should be reduced by 500-750 calories.  Eating 1,200 - 1,500 calories per day can help most women lose weight.  Eating 1,500 - 1,800 calories per day can help most men lose weight. What is my plan? My goal is to have __________ calories per day. If I have this many calories per day, I should lose around __________ pounds per week. What do I need to know about calorie counting? In order to meet your daily calorie goal, you will need to:  Find out how many calories are in each food you would like to eat. Try to do this before you eat.  Decide how much of the food you plan to eat.  Write down what you ate and how many calories it had. Doing this is called keeping a food log. To successfully lose weight, it is important to balance calorie counting with a healthy lifestyle that includes  regular activity. Aim for 150 minutes of moderate exercise (such as walking) or 75 minutes of vigorous exercise (such as running) each week. Where do I find calorie information?  The number of calories in a food can be found on a Nutrition Facts label. If a food does not have a Nutrition Facts label, try to look up the calories online or ask your dietitian for help. Remember that calories are listed per serving. If you choose to have more than one serving of a food, you will have to multiply the calories per serving by the amount of servings you plan to eat. For example, the label on a package of bread might say that a serving size is 1 slice and that there are 90 calories in a serving. If you eat 1 slice, you will have eaten 90 calories. If you eat 2 slices, you will have eaten 180 calories. How do I keep a food log? Immediately after each meal, record the following information in your food log:  What you ate. Don't forget to include toppings, sauces, and other extras on the food.  How much you ate. This can be measured in cups, ounces, or number of items.  How many calories each food and drink had.  The total number of calories in the meal. Keep your food log near you, such as in a small notebook in  your pocket, or use a mobile app or website. Some programs will calculate calories for you and show you how many calories you have left for the day to meet your goal. What are some calorie counting tips?   Use your calories on foods and drinks that will fill you up and not leave you hungry: ? Some examples of foods that fill you up are nuts and nut butters, vegetables, lean proteins, and high-fiber foods like whole grains. High-fiber foods are foods with more than 5 g fiber per serving. ? Drinks such as sodas, specialty coffee drinks, alcohol, and juices have a lot of calories, yet do not fill you up.  Eat nutritious foods and avoid empty calories. Empty calories are calories you get from foods  or beverages that do not have many vitamins or protein, such as candy, sweets, and soda. It is better to have a nutritious high-calorie food (such as an avocado) than a food with few nutrients (such as a bag of chips).  Know how many calories are in the foods you eat most often. This will help you calculate calorie counts faster.  Pay attention to calories in drinks. Low-calorie drinks include water and unsweetened drinks.  Pay attention to nutrition labels for "low fat" or "fat free" foods. These foods sometimes have the same amount of calories or more calories than the full fat versions. They also often have added sugar, starch, or salt, to make up for flavor that was removed with the fat.  Find a way of tracking calories that works for you. Get creative. Try different apps or programs if writing down calories does not work for you. What are some portion control tips?  Know how many calories are in a serving. This will help you know how many servings of a certain food you can have.  Use a measuring cup to measure serving sizes. You could also try weighing out portions on a kitchen scale. With time, you will be able to estimate serving sizes for some foods.  Take some time to put servings of different foods on your favorite plates, bowls, and cups so you know what a serving looks like.  Try not to eat straight from a bag or box. Doing this can lead to overeating. Put the amount you would like to eat in a cup or on a plate to make sure you are eating the right portion.  Use smaller plates, glasses, and bowls to prevent overeating.  Try not to multitask (for example, watch TV or use your computer) while eating. If it is time to eat, sit down at a table and enjoy your food. This will help you to know when you are full. It will also help you to be aware of what you are eating and how much you are eating. What are tips for following this plan? Reading food labels  Check the calorie count  compared to the serving size. The serving size may be smaller than what you are used to eating.  Check the source of the calories. Make sure the food you are eating is high in vitamins and protein and low in saturated and trans fats. Shopping  Read nutrition labels while you shop. This will help you make healthy decisions before you decide to purchase your food.  Make a grocery list and stick to it. Cooking  Try to cook your favorite foods in a healthier way. For example, try baking instead of frying.  Use low-fat dairy products. Meal planning  Use more fruits and vegetables. Half of your plate should be fruits and vegetables.  Include lean proteins like poultry and fish. How do I count calories when eating out?  Ask for smaller portion sizes.  Consider sharing an entree and sides instead of getting your own entree.  If you get your own entree, eat only half. Ask for a box at the beginning of your meal and put the rest of your entree in it so you are not tempted to eat it.  If calories are listed on the menu, choose the lower calorie options.  Choose dishes that include vegetables, fruits, whole grains, low-fat dairy products, and lean protein.  Choose items that are boiled, broiled, grilled, or steamed. Stay away from items that are buttered, battered, fried, or served with cream sauce. Items labeled "crispy" are usually fried, unless stated otherwise.  Choose water, low-fat milk, unsweetened iced tea, or other drinks without added sugar. If you want an alcoholic beverage, choose a lower calorie option such as a glass of wine or light beer.  Ask for dressings, sauces, and syrups on the side. These are usually high in calories, so you should limit the amount you eat.  If you want a salad, choose a garden salad and ask for grilled meats. Avoid extra toppings like bacon, cheese, or fried items. Ask for the dressing on the side, or ask for olive oil and vinegar or lemon to use as  dressing.  Estimate how many servings of a food you are given. For example, a serving of cooked rice is  cup or about the size of half a baseball. Knowing serving sizes will help you be aware of how much food you are eating at restaurants. The list below tells you how big or small some common portion sizes are based on everyday objects: ? 1 oz-4 stacked dice. ? 3 oz-1 deck of cards. ? 1 tsp-1 die. ? 1 Tbsp- a ping-pong ball. ? 2 Tbsp-1 ping-pong ball. ?  cup- baseball. ? 1 cup-1 baseball. Summary  Calorie counting means keeping track of how many calories you eat and drink each day. If you eat fewer calories than your body needs, you should lose weight.  A healthy amount of weight to lose per week is usually 1-2 lb (0.5-0.9 kg). This usually means reducing your daily calorie intake by 500-750 calories.  The number of calories in a food can be found on a Nutrition Facts label. If a food does not have a Nutrition Facts label, try to look up the calories online or ask your dietitian for help.  Use your calories on foods and drinks that will fill you up, and not on foods and drinks that will leave you hungry.  Use smaller plates, glasses, and bowls to prevent overeating. This information is not intended to replace advice given to you by your health care provider. Make sure you discuss any questions you have with your health care provider. Document Revised: 10/25/2017 Document Reviewed: 01/06/2016 Elsevier Patient Education  2020 [employer].   Exercising to Lose Weight Exercise is structured, repetitive physical activity to improve fitness and health. Getting regular exercise is important for everyone. It is especially important if you are overweight. Being overweight increases your risk of heart disease, stroke, diabetes, high blood pressure, and several types of cancer. Reducing your calorie intake and exercising can help you lose weight. Exercise is usually categorized as moderate or  vigorous intensity. To lose weight, most people need to do a  certain amount of moderate-intensity or vigorous-intensity exercise each week. Moderate-intensity exercise  Moderate-intensity exercise is any activity that gets you moving enough to burn at least three times more energy (calories) than if you were sitting. Examples of moderate exercise include:  Walking a mile in 15 minutes.  Doing light yard work.  Biking at an easy pace. Most people should get at least 150 minutes (2 hours and 30 minutes) a week of moderate-intensity exercise to maintain their body weight. Vigorous-intensity exercise Vigorous-intensity exercise is any activity that gets you moving enough to burn at least six times more calories than if you were sitting. When you exercise at this intensity, you should be working hard enough that you are not able to carry on a conversation. Examples of vigorous exercise include:  Running.  Playing a team sport, such as football, basketball, and soccer.  Jumping rope. Most people should get at least 75 minutes (1 hour and 15 minutes) a week of vigorous-intensity exercise to maintain their body weight. How can exercise affect me? When you exercise enough to burn more calories than you eat, you lose weight. Exercise also reduces body fat and builds muscle. The more muscle you have, the more calories you burn. Exercise also:  Improves mood.  Reduces stress and tension.  Improves your overall fitness, flexibility, and endurance.  Increases bone strength. The amount of exercise you need to lose weight depends on:  Your age.  The type of exercise.  Any health conditions you have.  Your overall physical ability. Talk to your health care provider about how much exercise you need and what types of activities are safe for you. What actions can I take to lose weight? Nutrition   Make changes to your diet as told by your health care provider or diet and nutrition  specialist (dietitian). This may include: ? Eating fewer calories. ? Eating more protein. ? Eating less unhealthy fats. ? Eating a diet that includes fresh fruits and vegetables, whole grains, low-fat dairy products, and lean protein. ? Avoiding foods with added fat, salt, and sugar.  Drink plenty of water while you exercise to prevent dehydration or heat stroke. Activity  Choose an activity that you enjoy and set realistic goals. Your health care provider can help you make an exercise plan that works for you.  Exercise at a moderate or vigorous intensity most days of the week. ? The intensity of exercise may vary from person to person. You can tell how intense a workout is for you by paying attention to your breathing and heartbeat. Most people will notice their breathing and heartbeat get faster with more intense exercise.  Do resistance training twice each week, such as: ? Push-ups. ? Sit-ups. ? Lifting weights. ? Using resistance bands.  Getting short amounts of exercise can be just as helpful as long structured periods of exercise. If you have trouble finding time to exercise, try to include exercise in your daily routine. ? Get up, stretch, and walk around every 30 minutes throughout the day. ? Go for a walk during your lunch break. ? Park your car farther away from your destination. ? If you take public transportation, get off one stop early and walk the rest of the way. ? Make phone calls while standing up and walking around. ? Take the stairs instead of elevators or escalators.  Wear comfortable clothes and shoes with good support.  Do not exercise so much that you hurt yourself, feel dizzy, or get very short of  breath. Where to find more information  [place] Department of Health and Human Services: ThisPath.fi  Centers for Disease Control and Prevention (CDC): FootballExhibition.com.br Contact a health care provider:  Before starting a new exercise program.  If you have questions or  concerns about your weight.  If you have a medical problem that keeps you from exercising. Get help right away if you have any of the following while exercising:  Injury.  Dizziness.  Difficulty breathing or shortness of breath that does not go away when you stop exercising.  Chest pain.  Rapid heartbeat. Summary  Being overweight increases your risk of heart disease, stroke, diabetes, high blood pressure, and several types of cancer.  Losing weight happens when you burn more calories than you eat.  Reducing the amount of calories you eat in addition to getting regular moderate or vigorous exercise each week helps you lose weight. This information is not intended to replace advice given to you by your health care provider. Make sure you discuss any questions you have with your health care provider. Document Revised: 02/18/2017 Document Reviewed: 02/18/2017 Elsevier Patient Education  2020 [employer].

## 2019-07-02 NOTE — Progress Notes (Signed)
Subjective:  Patient ID: Amy [name], female    DOB: Feb 09, 1986  Age: 34 y.o. MRN: [id number]  CC: Anxiety (Pt reports anxiety and depression got worse-pt states this week hard and emotional)  HPI Anxiety and Depression: Worsening, current use of zoloft and vistaril Did not schedule appt with psychology. Use of ETOH to cope (2glasses of ETOH) Has not made any lifestyle changes. Depression screen [facility] 2/9 07/02/2019 07/02/2019 12/05/2018  Decreased Interest 3 1 2   Down, Depressed, Hopeless 3 1 2   PHQ - 2 Score 6 2 4   Altered sleeping 1 - 3  Tired, decreased energy 3 - 2  Change in appetite 3 - 3  Feeling bad or failure about yourself  3 - 1  Trouble concentrating 2 - 1  Moving slowly or fidgety/restless 3 - 0  Suicidal thoughts 0 - 0  PHQ-9 Score 21 - 14  Difficult doing work/chores Very difficult - -   GAD 7 : Generalized Anxiety Score 07/02/2019 12/05/2018  Nervous, Anxious, on Edge 1 2  Control/stop worrying 1 1  Worry too much - different things 2 2  Trouble relaxing 2 2  Restless 0 0  Easily annoyed or irritable 0 0  Afraid - awful might happen 1 1  Total GAD 7 Score 7 8  Anxiety Difficulty Very difficult -   Reviewed past Medical, Social and Family history today.  Outpatient Medications Prior to Visit  Medication Sig Dispense Refill  . Etonogestrel (NEXPLANON [place]) Inject into the skin. 3 years    . hydrOXYzine (ATARAX/VISTARIL) 25 MG tablet Take 1-2 tablets (25-50 mg total) by mouth at bedtime. 60 tablet 1  . valACYclovir (VALTREX) 500 MG tablet TAKE 1 TABLET BY MOUTH ONCE DAILY (Patient taking differently: 2 (two) times daily. ) 90 tablet 1  . sertraline (ZOLOFT) 50 MG tablet Take 1 tablet (50 mg total) by mouth at bedtime. (Patient taking differently: Take 150 mg by mouth at bedtime. ) 90 tablet 1  . polyethylene glycol (MIRALAX / GLYCOLAX) 17 g packet Take 17 g by mouth daily.    . Prenatal Vit-Fe Fumarate-FA (MULTIVITAMIN-PRENATAL) 27-0.8 MG TABS tablet Take 1  tablet by mouth daily at 12 noon.     No facility-administered medications prior to visit.    ROS See HPI  Objective:  BP 128/68   Pulse 87   Temp (!) 97 F (36.1 C) (Tympanic)   Ht 5\' 4"  (1.626 m)   Wt 233 lb 12.8 oz (106.1 kg)   SpO2 98%   BMI 40.13 kg/m   BP Readings from Last 3 Encounters:  07/02/19 128/68  01/05/19 112/82  12/05/18 100/70    Wt Readings from Last 3 Encounters:  07/02/19 233 lb 12.8 oz (106.1 kg)  01/05/19 223 lb 6.4 oz (101.3 kg)  12/05/18 216 lb (98 kg)   Physical Exam Psychiatric:        Attention and Perception: Attention normal.        Mood and Affect: Mood is depressed. Affect is tearful.        Speech: Speech normal.        Behavior: Behavior is cooperative.        Thought Content: Thought content is not paranoid or delusional. Thought content does not include homicidal or suicidal ideation. Thought content does not include homicidal or suicidal plan.        Cognition and Memory: Cognition normal.    Lab Results  Component Value Date   WBC 7.8 12/05/2018  HGB 12.5 12/05/2018   HCT 38.2 12/05/2018   PLT 333.0 12/05/2018   GLUCOSE 86 12/05/2018   CHOL 262 (H) 12/05/2018   TRIG 81.0 12/05/2018   HDL 50.60 12/05/2018   LDLCALC 195 (H) 12/05/2018   ALT 22 12/05/2018   AST 18 12/05/2018   NA 136 12/05/2018   K 4.2 12/05/2018   CL 103 12/05/2018   CREATININE 0.68 12/05/2018   BUN 14 12/05/2018   CO2 25 12/05/2018   TSH 0.89 12/05/2018   HGBA1C 6.1 12/05/2018    Assessment & Plan:  This visit occurred during the SARS-CoV-2 public health emergency.  Safety protocols were in place, including screening questions prior to the visit, additional usage of staff PPE, and extensive cleaning of exam room while observing appropriate contact time as indicated for disinfecting solutions.   Amy [name] was seen today for anxiety.  Diagnoses and all orders for this visit:  Severe episode of recurrent major depressive disorder, without psychotic  features ([place]) -     buPROPion (WELLBUTRIN) 100 MG tablet; Take 1 tablet (100 mg total) by mouth 2 (two) times daily.   I have discontinued Lasondra V. Youngman's sertraline. I am also having her start on buPROPion. Additionally, I am having her maintain her valACYclovir, multivitamin-prenatal, polyethylene glycol, hydrOXYzine, and Etonogestrel (NEXPLANON [place]).  Meds ordered this encounter  Medications  . buPROPion (WELLBUTRIN) 100 MG tablet    Sig: Take 1 tablet (100 mg total) by mouth 2 (two) times daily.    Dispense:  60 tablet    Refill:  5    Discontinue zoloft    Order Specific Question:   Supervising Provider    Answer:   Ronnald Nian [[id number]]    Problem List Items Addressed This Visit      Other   Generalized anxiety disorder - Primary   Relevant Medications   buPROPion (WELLBUTRIN) 100 MG tablet      Follow-up: Return in about 4 weeks (around 07/30/2019) for anxiety and depression (video, 60mins).  Wilfred Lacy, NP

## 2019-07-02 NOTE — Assessment & Plan Note (Signed)
Worsening D/c zoloft Start wellbutrin F/up in 6month Encourage to stop ETOh use, maintain healthy diet and start daily exercise.

## 2019-07-07 ENCOUNTER — Telehealth: Payer: Self-pay | Admitting: Nurse Practitioner

## 2019-07-07 NOTE — Telephone Encounter (Signed)
Patient is calling and wanted to speak to someone regarding FMLA paperwork. CB is [phone].

## 2019-07-07 NOTE — Telephone Encounter (Signed)
I do not remember discussing FMLA with her, but I will be ok completing form for her to go for counseling. I need her to make first appt, then therapist send note on frequency of sessions. Thank you

## 2019-07-07 NOTE — Telephone Encounter (Signed)
Amy [name] please advise.  Pt called stating she discussed FMLA with you on last visit 07/02/19 for anxiety an how she needed counseling but was in work and couldn't. Pt asking if its okay to get FMLA for this.

## 2019-07-08 NOTE — Telephone Encounter (Signed)
Pt notified and understood to fax over forms and I gave her fax number to fax them. Pt is aware to send over first therapist note.

## 2019-07-08 NOTE — Telephone Encounter (Signed)
She can fax documents or drop them off prior to that date. Her form is completed based on her appts with psychologist and not the appts with me. I will need a note from the therapist after that initial appt.

## 2019-07-08 NOTE — Telephone Encounter (Signed)
Charlotte please advise.  Pt said she has an appointment with you June 9th @ 11:30am shes wondering if she can make that in person if she has to so she can give you the documents and anything else that needs to be done.  Pt also has an upcoming appointment with counseling.

## 2019-07-13 NOTE — Telephone Encounter (Signed)
Patient called to check status on picking up forms. She stated she faxed them as requested on Thursday.

## 2019-07-14 NOTE — Telephone Encounter (Signed)
Amy [name] these FMLA forms are on your desk for review.

## 2019-07-15 NOTE — Telephone Encounter (Signed)
Do you have notes from the therapist?

## 2019-07-16 NOTE — Telephone Encounter (Signed)
Left a voicemail for pt to call back regarding her FMLA forms to be filled out and how we need a therapist note to fill out under therapy.

## 2019-07-17 NOTE — Telephone Encounter (Signed)
Patient is returning the call. Also patient stated that she was denied FMLA because patient was on maternity leave. Patient will be sending over ADA paperwork.  CB is [phone]

## 2019-07-21 ENCOUNTER — Telehealth: Payer: Self-pay | Admitting: Nurse Practitioner

## 2019-07-21 NOTE — Telephone Encounter (Signed)
Pt came in and dropped off form for [facility], Pt request call when its done [phone]. I put paperwork in Charlottes folder up front

## 2019-07-21 NOTE — Telephone Encounter (Signed)
Amy [name] FYI  Pt was notified and explained how FMLA is based on therapist notes not yours. Pt stated she was denied FMLA because she has to wait a year after maternity leave. Pt states she has other forms to complete an is trying to move her therapy appointment that's on June 9th to a sooner date to give you the therapy notes at her next appointment with you which is also on June 9th. Pt stated she will call us back after she tries to get a earlier appointment with therapist.

## 2019-07-23 NOTE — Telephone Encounter (Signed)
FMLA forms was received and left pt a voicemail but also sent her a My Chart message letting her know that we have her forms and reminded her that we need her first therapy appointments initial notes cause its based on that.

## 2019-07-27 DIAGNOSIS — M2241 Chondromalacia patellae, right knee: Secondary | ICD-10-CM | POA: Diagnosis not present

## 2019-07-27 DIAGNOSIS — M722 Plantar fascial fibromatosis: Secondary | ICD-10-CM | POA: Diagnosis not present

## 2019-07-29 ENCOUNTER — Ambulatory Visit (INDEPENDENT_AMBULATORY_CARE_PROVIDER_SITE_OTHER): Payer: 59 | Admitting: Psychology

## 2019-07-29 ENCOUNTER — Encounter: Payer: Self-pay | Admitting: Nurse Practitioner

## 2019-07-29 ENCOUNTER — Telehealth (INDEPENDENT_AMBULATORY_CARE_PROVIDER_SITE_OTHER): Payer: 59 | Admitting: Nurse Practitioner

## 2019-07-29 ENCOUNTER — Other Ambulatory Visit: Payer: Self-pay

## 2019-07-29 VITALS — HR 84 | Ht 64.0 in | Wt 233.0 lb

## 2019-07-29 DIAGNOSIS — F322 Major depressive disorder, single episode, severe without psychotic features: Secondary | ICD-10-CM | POA: Diagnosis not present

## 2019-07-29 DIAGNOSIS — F320 Major depressive disorder, single episode, mild: Secondary | ICD-10-CM | POA: Diagnosis not present

## 2019-07-29 NOTE — Progress Notes (Signed)
Virtual Visit via Video Note  I connected with@ on 07/29/19 at 11:30 AM EDT by a video enabled telemedicine application and verified that I am speaking with the correct person using two identifiers.  Location: Patient:Home Provider: Office Participants: patient and provider   I discussed the limitations of evaluation and management by telemedicine and the availability of in person appointments. I also discussed with the patient that there may be a patient responsible charge related to this service. The patient expressed understanding and agreed to proceed.  CC:3 weeks-anxiety and depression-pt states she feels like wellbutrin is helping and she feels better  History of Present Illness: Reports improved mood with wellbutrin BID, denies any adverse side effects. Has appt with psychologist today at 2pm. Denies any SI/HI   Observations/Objective: Physical Exam  Constitutional: She is oriented to person, place, and time. No distress.  Pulmonary/Chest: Effort normal.  Neurological: She is alert and oriented to person, place, and time.  Psychiatric: She has a normal mood and affect. Her behavior is normal. Thought content normal.   Assessment and Plan: Hayde was seen today for follow-up.  Diagnoses and all orders for this visit:  Depression, major, single episode, severe (HCC)   Follow Up Instructions: Maintain current medication and therapy session F/up in 76months   I discussed the assessment and treatment plan with the patient. The patient was provided an opportunity to ask questions and all were answered. The patient agreed with the plan and demonstrated an understanding of the instructions.   The patient was advised to call back or seek an in-person evaluation if the symptoms worsen or if the condition fails to improve as anticipated.   Alysia Penna, NP

## 2019-08-04 ENCOUNTER — Telehealth: Payer: Self-pay | Admitting: Nurse Practitioner

## 2019-08-04 NOTE — Telephone Encounter (Signed)
Patient called to see if we received paperwork faxed from her behavioral health counselor. Should have been faxed Friday, June 11th. Patient would like to know when Claris Gower will have time to review it and then complete the [facility] paperwork.

## 2019-08-04 NOTE — Telephone Encounter (Signed)
Amy [name] please advise.  Pt has the right set of forms to fill out for her FMLA for 4 weeks-I left these on your desk for direction on filling them out.

## 2019-08-06 ENCOUNTER — Telehealth: Payer: Self-pay

## 2019-08-06 NOTE — Telephone Encounter (Signed)
Patient called and wanted to let someone know that she could come pick up the forms today, please advise. CB is [phone]

## 2019-08-06 NOTE — Telephone Encounter (Signed)
Form completed.

## 2019-08-06 NOTE — Telephone Encounter (Signed)
Pt notified and said she was picking those forms up today, forms left up front in green folder.

## 2019-08-06 NOTE — Telephone Encounter (Signed)
FMLA papers was completed and left up front for pt to pick up today in green folder.

## 2019-08-14 MED FILL — buPROPion HCL 100 MG TABS: 100 | 30 days supply | Qty: 60 | Fill #1

## 2019-08-27 ENCOUNTER — Ambulatory Visit (INDEPENDENT_AMBULATORY_CARE_PROVIDER_SITE_OTHER): Payer: 59 | Admitting: Psychology

## 2019-08-27 DIAGNOSIS — F320 Major depressive disorder, single episode, mild: Secondary | ICD-10-CM

## 2019-09-03 DIAGNOSIS — Z01419 Encounter for gynecological examination (general) (routine) without abnormal findings: Secondary | ICD-10-CM | POA: Diagnosis not present

## 2019-09-03 DIAGNOSIS — Z975 Presence of (intrauterine) contraceptive device: Secondary | ICD-10-CM | POA: Diagnosis not present

## 2019-09-03 DIAGNOSIS — Z3009 Encounter for other general counseling and advice on contraception: Secondary | ICD-10-CM | POA: Diagnosis not present

## 2019-09-06 LAB — HM PAP SMEAR: HM Pap smear: NEGATIVE

## 2019-09-14 DIAGNOSIS — Z20822 Contact with and (suspected) exposure to covid-19: Secondary | ICD-10-CM | POA: Diagnosis not present

## 2019-09-14 DIAGNOSIS — U071 COVID-19: Secondary | ICD-10-CM | POA: Diagnosis not present

## 2019-09-28 ENCOUNTER — Encounter: Payer: Self-pay | Admitting: Nurse Practitioner

## 2019-09-28 ENCOUNTER — Other Ambulatory Visit: Payer: Self-pay

## 2019-09-28 ENCOUNTER — Telehealth (INDEPENDENT_AMBULATORY_CARE_PROVIDER_SITE_OTHER): Payer: 59 | Admitting: Nurse Practitioner

## 2019-09-28 VITALS — Ht 64.0 in | Wt 218.0 lb

## 2019-09-28 DIAGNOSIS — F322 Major depressive disorder, single episode, severe without psychotic features: Secondary | ICD-10-CM | POA: Diagnosis not present

## 2019-09-28 MED ORDER — BUSPIRONE HCL 7.5 MG PO TABS: 7.5000 mg | ORAL_TABLET | Freq: Two times a day (BID) | ORAL | 5 refills | Status: DC

## 2019-09-28 MED FILL — buPROPion HCL 100 MG TABS: 100 | 30 days supply | Qty: 60 | Fill #2

## 2019-09-28 MED FILL — busPIRone HCL 7.5 MG TABS: 7.5 | 30 days supply | Qty: 60 | Fill #0

## 2019-09-28 NOTE — Progress Notes (Signed)
Virtual Visit via Video Note  I connected with@ on 09/28/19 at  8:45 AM EDT by a video enabled telemedicine application and verified that I am speaking with the correct person using two identifiers.  Location: Patient:Home Provider: Office Participants: patient and provider  I discussed the limitations of evaluation and management by telemedicine and the availability of in person appointments. I also discussed with the patient that there may be a patient responsible charge related to this service. The patient expressed understanding and agreed to proceed.  OZ:DGUYQIH and depression f/up  History of Present Illness: Depression, major, single episode, severe (HCC) Waxing and waning, increase anxiety in last 2weeks due to work schedule (on call) Vistaril has not been effective. No SI or HI or hallucination. Has sessions with therapist once a month.  Continue wellbutrin 100mg  BID Start buspar BID Schedule appt with therapist, discuss need for weekly of bi-weekly appts. F/up in 51month  Observations/Objective: Physical Exam Vitals reviewed.  Neurological:     Mental Status: She is alert and oriented to person, place, and time.  Psychiatric:        Mood and Affect: Mood normal.        Behavior: Behavior normal.        Thought Content: Thought content normal.    Assessment and Plan: Emanuel was seen today for follow-up.  Diagnoses and all orders for this visit:  Depression, major, single episode, severe (HCC) -     busPIRone (BUSPAR) 7.5 MG tablet; Take 1 tablet (7.5 mg total) by mouth 2 (two) times daily.   Follow Up Instructions: See above   I discussed the assessment and treatment plan with the patient. The patient was provided an opportunity to ask questions and all were answered. The patient agreed with the plan and demonstrated an understanding of the instructions.   The patient was advised to call back or seek an in-person evaluation if the symptoms worsen or if the  condition fails to improve as anticipated.  Barnett Applebaum, NP

## 2019-09-28 NOTE — Assessment & Plan Note (Signed)
Waxing and waning, increase anxiety in last 2weeks due to work schedule (on call) Vistaril has not been effective. No SI or HI or hallucination. Has sessions with therapist once a month.  Continue wellbutrin 100mg  BID Start buspar BID Schedule appt with therapist, discuss need for weekly of bi-weekly appts. F/up in 12month

## 2019-10-08 ENCOUNTER — Ambulatory Visit: Payer: 59 | Admitting: Psychology

## 2019-11-02 MED FILL — busPIRone HCL 7.5 MG TABS: 7.5 | 30 days supply | Qty: 60 | Fill #1

## 2019-11-05 MED FILL — buPROPion HCL 100 MG TABS: 100 | 30 days supply | Qty: 60 | Fill #3

## 2019-11-06 ENCOUNTER — Encounter: Payer: Self-pay | Admitting: Nurse Practitioner

## 2019-11-06 ENCOUNTER — Telehealth (INDEPENDENT_AMBULATORY_CARE_PROVIDER_SITE_OTHER): Payer: 59 | Admitting: Nurse Practitioner

## 2019-11-06 VITALS — Ht 64.0 in | Wt 221.0 lb

## 2019-11-06 DIAGNOSIS — F411 Generalized anxiety disorder: Secondary | ICD-10-CM

## 2019-11-06 DIAGNOSIS — F322 Major depressive disorder, single episode, severe without psychotic features: Secondary | ICD-10-CM | POA: Diagnosis not present

## 2019-11-06 MED ORDER — BUSPIRONE HCL 7.5 MG PO TABS: 7.5000 mg | ORAL_TABLET | Freq: Three times a day (TID) | ORAL | 5 refills | Status: DC

## 2019-11-06 MED ORDER — BUPROPION HCL 100 MG PO TABS: 100.0000 mg | ORAL_TABLET | Freq: Two times a day (BID) | ORAL | 1 refills | Status: DC

## 2019-11-06 NOTE — Assessment & Plan Note (Signed)
Improved mood with wellbutrin and buspar. Has appt with therapist next month She has discontinue ETOH use.  F/up in 66months Maintain current medications

## 2019-11-06 NOTE — Progress Notes (Signed)
Virtual Visit via Video Note  I connected with@ on 11/06/19 at  9:30 AM EDT by a video enabled telemedicine application and verified that I am speaking with the correct person using two identifiers.  Location: Patient:Home Provider: Office Participants: patient and provider  I discussed the limitations of evaluation and management by telemedicine and the availability of in person appointments. I also discussed with the patient that there may be a patient responsible charge related to this service. The patient expressed understanding and agreed to proceed.  HT:DSKAJGO and depression f/up  History of Present Illness: Depression, major, single episode, severe (HCC) Improved mood with wellbutrin and buspar. Has appt with therapist next month She has discontinue ETOH use.  F/up in 30months Maintain current medications  Observations/Objective: Physical Exam Neurological:     Mental Status: She is alert and oriented to person, place, and time.  Psychiatric:        Mood and Affect: Mood normal.        Behavior: Behavior normal.        Thought Content: Thought content normal.    Assessment and Plan: Kemyra was seen today for follow-up.  Diagnoses and all orders for this visit:  Depression, major, single episode, severe (HCC) -     buPROPion (WELLBUTRIN) 100 MG tablet; Take 1 tablet (100 mg total) by mouth 2 (two) times daily. -     Discontinue: busPIRone (BUSPAR) 7.5 MG tablet; Take 1 tablet (7.5 mg total) by mouth 3 (three) times daily. -     busPIRone (BUSPAR) 7.5 MG tablet; Take 1 tablet (7.5 mg total) by mouth 3 (three) times daily.  GAD (generalized anxiety disorder) -     Discontinue: busPIRone (BUSPAR) 7.5 MG tablet; Take 1 tablet (7.5 mg total) by mouth 3 (three) times daily. -     busPIRone (BUSPAR) 7.5 MG tablet; Take 1 tablet (7.5 mg total) by mouth 3 (three) times daily.   Follow Up Instructions: See avs   I discussed the assessment and treatment plan with the  patient. The patient was provided an opportunity to ask questions and all were answered. The patient agreed with the plan and demonstrated an understanding of the instructions.   The patient was advised to call back or seek an in-person evaluation if the symptoms worsen or if the condition fails to improve as anticipated.   Alysia Penna, NP

## 2019-11-12 ENCOUNTER — Ambulatory Visit: Payer: 59 | Admitting: Psychology

## 2019-11-25 MED FILL — busPIRone HCL 7.5 MG TABS: 7.5 | 30 days supply | Qty: 60 | Fill #2

## 2019-11-26 ENCOUNTER — Ambulatory Visit (INDEPENDENT_AMBULATORY_CARE_PROVIDER_SITE_OTHER): Payer: 59 | Admitting: Psychology

## 2019-11-26 DIAGNOSIS — F320 Major depressive disorder, single episode, mild: Secondary | ICD-10-CM | POA: Diagnosis not present

## 2019-12-10 ENCOUNTER — Ambulatory Visit: Payer: 59 | Admitting: Psychology

## 2019-12-10 MED FILL — buPROPion HCL 100 MG TABS: 100 | 30 days supply | Qty: 60 | Fill #4

## 2019-12-24 ENCOUNTER — Ambulatory Visit (INDEPENDENT_AMBULATORY_CARE_PROVIDER_SITE_OTHER): Payer: 59 | Admitting: Psychology

## 2019-12-24 DIAGNOSIS — F320 Major depressive disorder, single episode, mild: Secondary | ICD-10-CM

## 2019-12-31 MED FILL — busPIRone HCL 7.5 MG TABS: 7.5 | 30 days supply | Qty: 90 | Fill #0

## 2020-01-07 ENCOUNTER — Ambulatory Visit: Payer: 59 | Admitting: Psychology

## 2020-01-19 MED FILL — buPROPion HCL 100 MG TABS: 100 | 30 days supply | Qty: 60 | Fill #5

## 2020-01-26 ENCOUNTER — Other Ambulatory Visit: Payer: Self-pay

## 2020-01-27 ENCOUNTER — Other Ambulatory Visit: Payer: Self-pay | Admitting: Nurse Practitioner

## 2020-01-27 ENCOUNTER — Encounter: Payer: Self-pay | Admitting: Nurse Practitioner

## 2020-01-27 ENCOUNTER — Ambulatory Visit (INDEPENDENT_AMBULATORY_CARE_PROVIDER_SITE_OTHER): Payer: 59 | Admitting: Nurse Practitioner

## 2020-01-27 VITALS — BP 108/70 | HR 93 | Temp 97.8°F | Ht 64.25 in | Wt 226.2 lb

## 2020-01-27 DIAGNOSIS — E669 Obesity, unspecified: Secondary | ICD-10-CM | POA: Insufficient documentation

## 2020-01-27 DIAGNOSIS — Z0000 Encounter for general adult medical examination without abnormal findings: Secondary | ICD-10-CM | POA: Diagnosis not present

## 2020-01-27 DIAGNOSIS — E782 Mixed hyperlipidemia: Secondary | ICD-10-CM

## 2020-01-27 DIAGNOSIS — R7303 Prediabetes: Secondary | ICD-10-CM | POA: Diagnosis not present

## 2020-01-27 DIAGNOSIS — Z6838 Body mass index (BMI) 38.0-38.9, adult: Secondary | ICD-10-CM

## 2020-01-27 DIAGNOSIS — F322 Major depressive disorder, single episode, severe without psychotic features: Secondary | ICD-10-CM

## 2020-01-27 DIAGNOSIS — L83 Acanthosis nigricans: Secondary | ICD-10-CM | POA: Diagnosis not present

## 2020-01-27 DIAGNOSIS — E559 Vitamin D deficiency, unspecified: Secondary | ICD-10-CM | POA: Diagnosis not present

## 2020-01-27 DIAGNOSIS — Z0001 Encounter for general adult medical examination with abnormal findings: Secondary | ICD-10-CM

## 2020-01-27 DIAGNOSIS — F411 Generalized anxiety disorder: Secondary | ICD-10-CM | POA: Diagnosis not present

## 2020-01-27 DIAGNOSIS — E663 Overweight: Secondary | ICD-10-CM | POA: Insufficient documentation

## 2020-01-27 DIAGNOSIS — F5102 Adjustment insomnia: Secondary | ICD-10-CM

## 2020-01-27 LAB — CBC
HCT: 42 % (ref 36.0–46.0)
Hemoglobin: 14.1 g/dL (ref 12.0–15.0)
MCHC: 33.6 g/dL (ref 30.0–36.0)
MCV: 85.9 fl (ref 78.0–100.0)
Platelets: 302 10*3/uL (ref 150.0–400.0)
RBC: 4.89 Mil/uL (ref 3.87–5.11)
RDW: 14.6 % (ref 11.5–15.5)
WBC: 8.4 10*3/uL (ref 4.0–10.5)

## 2020-01-27 LAB — COMPREHENSIVE METABOLIC PANEL
ALT: 14 U/L (ref 0–35)
AST: 15 U/L (ref 0–37)
Albumin: 4.2 g/dL (ref 3.5–5.2)
Alkaline Phosphatase: 78 U/L (ref 39–117)
BUN: 11 mg/dL (ref 6–23)
CO2: 28 mEq/L (ref 19–32)
Calcium: 9.4 mg/dL (ref 8.4–10.5)
Chloride: 100 mEq/L (ref 96–112)
Creatinine, Ser: 0.9 mg/dL (ref 0.40–1.20)
GFR: 84.52 mL/min (ref >60.00)
Glucose, Bld: 91 mg/dL (ref 70–99)
Potassium: 4.5 mEq/L (ref 3.5–5.1)
Sodium: 134 mEq/L — ABNORMAL LOW (ref 135–145)
Total Bilirubin: 0.4 mg/dL (ref 0.2–1.2)
Total Protein: 8.4 g/dL — ABNORMAL HIGH (ref 6.0–8.3)

## 2020-01-27 LAB — LIPID PANEL
Cholesterol: 227 mg/dL — ABNORMAL HIGH (ref 0–200)
HDL: 42.8 mg/dL (ref >39.00)
LDL Cholesterol: 168 mg/dL — ABNORMAL HIGH (ref 0–99)
NonHDL: 184.14
Total CHOL/HDL Ratio: 5
Triglycerides: 81 mg/dL (ref 0.0–149.0)
VLDL: 16.2 mg/dL (ref 0.0–40.0)

## 2020-01-27 LAB — HEMOGLOBIN A1C: Hgb A1c MFr Bld: 5.6 % (ref 4.6–6.5)

## 2020-01-27 LAB — TSH: TSH: 2.49 u[IU]/mL (ref 0.35–4.50)

## 2020-01-27 MED ORDER — BUSPIRONE HCL 7.5 MG PO TABS: 7.5000 mg | ORAL_TABLET | Freq: Three times a day (TID) | ORAL | 11 refills | Status: DC

## 2020-01-27 MED ORDER — BUPROPION HCL 100 MG PO TABS: 100.0000 mg | ORAL_TABLET | Freq: Two times a day (BID) | ORAL | 3 refills | Status: DC

## 2020-01-27 MED FILL — busPIRone HCL 7.5 MG TABS: 7.5 | 30 days supply | Qty: 90 | Fill #0

## 2020-01-27 NOTE — Assessment & Plan Note (Signed)
Abnormal lipid panel, prediabetes and acanthosis nigricans. Advised about the importance of heart healthy diet and daily exercise to promote weight loss, decreased risk for cardiovascular disease and improve mood. Provided printed material on recommended exercise and diet She declined referral to weight management clinic due work schedule and childcare. Repeat HgbA1c, lipid panel and TSH. F/up in 32months

## 2020-01-27 NOTE — Progress Notes (Signed)
Subjective:    Patient ID: Amy [name], female    DOB: 05-11-1985, 34 y.o.   MRN: [id number]  Patient presents today for CPE and  eval of chronic conditions  HPI Class 2 severe obesity due to excess calories with serious comorbidity and body mass index (BMI) of 38.0 to 38.9 in adult [facility]) Abnormal lipid panel, prediabetes and acanthosis nigricans. Advised about the importance of heart healthy diet and daily exercise to promote weight loss, decreased risk for cardiovascular disease and improve mood. Provided printed material on recommended exercise and diet She declined referral to weight management clinic due work schedule and childcare. Repeat HgbA1c, lipid panel and TSH. F/up in 33months  Depression, major, single episode, severe (HCC) Improved mood with buspar and wellbutrin Continues therapy sessions F/up in 69month.  Insomnia Advised need for behavioral changes: bedtime routine, avoid electronics, avoid caffeine, use medication and music, and daily exercise. No hypnotic at this time F/up in 33month   Sexual History (orientation,birth control, marital status, STD):  Depression/Suicide: Depression screen [facility] 2/9 01/27/2020 09/28/2019 07/02/2019 07/02/2019 12/05/2018 12/05/2018 02/28/2017  Decreased Interest 0 1 3 1 2 3  0  Down, Depressed, Hopeless 0 2 3 1 2 3  0  PHQ - 2 Score 0 3 6 2 4 6  0  Altered sleeping 2 3 1  - 3 - -  Tired, decreased energy 1 1 3  - 2 - -  Change in appetite 0 0 3 - 3 - -  Feeling bad or failure about yourself  0 2 3 - 1 - -  Trouble concentrating 0 1 2 - 1 - -  Moving slowly or fidgety/restless 0 2 3 - 0 - -  Suicidal thoughts 0 0 0 - 0 - -  PHQ-9 Score 3 12 21  - 14 - -  Difficult doing work/chores Not difficult at all - Very difficult - - - -   GAD 7 : Generalized Anxiety Score 01/27/2020 09/28/2019 07/02/2019 12/05/2018  Nervous, Anxious, on Edge 1 3 1 2   Control/stop worrying 1 3 1 1   Worry too much - different things 2 3 2 2   Trouble relaxing 0 2  2 2   Restless 0 2 0 0  Easily annoyed or irritable 0 0 0 0  Afraid - awful might happen 0 1 1 1   Total GAD 7 Score 4 14 7 8   Anxiety Difficulty Not difficult at all - Very difficult -   Vision:up to date  Dental:will schedule  Immunizations: (TDAP, Hep C screen, Pneumovax, Influenza, zoster)  Health Maintenance  Topic Date Due  . Pap Smear  02/09/2020  .  Hepatitis C: One time screening is recommended by Center for Disease Control  (CDC) for  adults born from [birth year] through 1963.   01/26/2021*  . Tetanus Vaccine  10/23/2026  . Flu Shot  Completed  . COVID-19 Vaccine  Completed  . HIV Screening  Completed  *Topic was postponed. The date shown is not the original due date.   Diet:regular   Weight:  Wt Readings from Last 3 Encounters:  01/27/20 226 lb 3.2 oz (102.6 kg)  11/06/19 221 lb (100.2 kg)  09/28/19 218 lb (98.9 kg)   Fall Risk: Fall Risk  01/27/2020 07/02/2019 12/05/2018 02/28/2017 11/28/2016  Falls in the past year? 0 0 0 Yes No  Number falls in past yr: 0 0 - 1 -  Injury with Fall? 0 0 - Yes -  Comment - - - knee injury -  Follow up Falls evaluation  completed - - - -   Medications and allergies reviewed with patient and updated if appropriate.  Patient Active Problem List   Diagnosis Date Noted  . Acanthosis nigricans 01/27/2020  . Class 2 severe obesity due to excess calories with serious comorbidity and body mass index (BMI) of 38.0 to 38.9 in adult (HCC) 01/27/2020  . GAD (generalized anxiety disorder) 11/06/2019  . Mixed hyperlipidemia 12/07/2018  . Vitamin D deficiency 03/04/2017  . 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 Prediabetes 05/31/2016  . Insomnia 11/04/2015  . Depression, major, single episode, severe (HCC) 11/04/2015    Current Outpatient Medications on File Prior to Visit  Medication Sig Dispense Refill  . Etonogestrel (NEXPLANON [place]) Inject into the skin. 3 years     No current facility-administered medications on file prior to visit.     Past Medical History:  Diagnosis Date  . Anxiety    with flying only  . GERD (gastroesophageal reflux disease)   . Herpes   . Vaginal Pap smear, abnormal     Past Surgical History:  Procedure Laterality Date  . CERVICAL BIOPSY  W/ LOOP ELECTRODE EXCISION    . CESAREAN SECTION N/A 07/20/2018   Procedure: CESAREAN SECTION;  Surgeon: Essie Hart, MD;  Location: [facility] LD ORS;  Service: Obstetrics;  Laterality: N/A;  . WISDOM TOOTH EXTRACTION      Social History   Socioeconomic History  . Marital status: Married    Spouse name: Not on file  . Number of children: Not on file  . Years of education: Not on file  . Highest education level: Not on file  Occupational History  . Occupation: Teacher, adult education: [place]    Comment: GI/endo at [employer]  Tobacco Use  . Smoking status: Never Smoker  . Smokeless tobacco: Never Used  Vaping Use  . Vaping Use: Never used  Substance and Sexual Activity  . Alcohol use: Not Currently    Comment: social  . Drug use: No  . Sexual activity: Yes    Birth control/protection: Condom  Other Topics Concern  . Not on file  Social History Narrative  . Not on file   Social Determinants of Health   Financial Resource Strain:   . Difficulty of Paying Living Expenses: Not on file  Food Insecurity:   . Worried About Programme researcher, broadcasting/film/video in the Last Year: Not on file  . Ran Out of Food in the Last Year: Not on file  Transportation Needs:   . Lack of Transportation (Medical): Not on file  . Lack of Transportation (Non-Medical): Not on file  Physical Activity:   . Days of Exercise per Week: Not on file  . Minutes of Exercise per Session: Not on file  Stress:   . Feeling of Stress : Not on file  Social Connections:   . Frequency of Communication with Friends and Family: Not on file  . Frequency of Social Gatherings with Friends and Family: Not on file  . Attends Religious Services: Not on file  . Active Member of Clubs or Organizations: Not on  file  . Attends Banker Meetings: Not on file  . Marital Status: Not on file   Family History  Problem Relation Age of Onset  . Hypertension Mother   . Hyperlipidemia Mother   . Hyperlipidemia Father   . Alcohol abuse Father   . Cirrhosis Father   . Hypertension Maternal Aunt   . Heart disease Paternal Grandmother  Review of Systems  Constitutional: Negative for fever, malaise/fatigue and weight loss.  HENT: Negative for congestion and sore throat.   Eyes:       Negative for visual changes  Respiratory: Negative for cough and shortness of breath.   Cardiovascular: Negative for chest pain, palpitations and leg swelling.  Gastrointestinal: Negative for blood in stool, constipation, diarrhea and heartburn.  Genitourinary: Negative for dysuria, frequency and urgency.  Musculoskeletal: Negative for falls, joint pain and myalgias.  Skin: Negative for rash.  Neurological: Negative for dizziness, sensory change and headaches.  Endo/Heme/Allergies: Does not bruise/bleed easily.  Psychiatric/Behavioral: Positive for depression. Negative for hallucinations, substance abuse and suicidal ideas. The patient is nervous/anxious and has insomnia.     Objective:   Vitals:   01/27/20 1017  BP: 108/70  Pulse: 93  Temp: 97.8 F (36.6 C)  SpO2: 98%    Body mass index is 38.53 kg/m.   Physical Examination:  Physical Exam Vitals and nursing note reviewed. Exam conducted with a chaperone present.  Constitutional:      General: She is not in acute distress.    Appearance: She is well-developed. She is obese.  HENT:     Right Ear: Tympanic membrane, ear canal and external ear normal.     Left Ear: Tympanic membrane, ear canal and external ear normal.  Eyes:     Extraocular Movements: Extraocular movements intact.     Conjunctiva/sclera: Conjunctivae normal.  Cardiovascular:     Rate and Rhythm: Normal rate and regular rhythm.     Pulses: Normal pulses.     Heart  sounds: Normal heart sounds.  Pulmonary:     Effort: Pulmonary effort is normal. No respiratory distress.     Breath sounds: Normal breath sounds.  Chest:     Chest wall: No tenderness.     Breasts:        Right: Normal.        Left: Normal.  Abdominal:     General: Bowel sounds are normal.     Palpations: Abdomen is soft.  Genitourinary:    Comments: Deferred to GYN per patient Musculoskeletal:        General: Normal range of motion.     Cervical back: Normal range of motion and neck supple.     Right lower leg: No edema.     Left lower leg: No edema.  Lymphadenopathy:     Cervical: No cervical adenopathy.     Upper Body:     Right upper body: No supraclavicular, axillary or pectoral adenopathy.     Left upper body: No supraclavicular, axillary or pectoral adenopathy.  Skin:    General: Skin is warm and dry.  Neurological:     Mental Status: She is alert and oriented to person, place, and time.     Deep Tendon Reflexes: Reflexes are normal and symmetric.  Psychiatric:        Mood and Affect: Mood normal.        Behavior: Behavior normal.        Thought Content: Thought content normal.    ASSESSMENT and PLAN: This visit occurred during the SARS-CoV-2 public health emergency.  Safety protocols were in place, including screening questions prior to the visit, additional usage of staff PPE, and extensive cleaning of exam room while observing appropriate contact time as indicated for disinfecting solutions.   Chimene was seen today for annual exam.  Diagnoses and all orders for this visit:  Encounter for preventative adult health care  exam with abnormal findings -     Comprehensive metabolic panel -     CBC  Vitamin D deficiency -     Vitamin D 1,25 dihydroxy  Prediabetes -     Hemoglobin A1c  Mixed hyperlipidemia -     Lipid panel  GAD (generalized anxiety disorder) -     busPIRone (BUSPAR) 7.5 MG tablet; Take 1 tablet (7.5 mg total) by mouth 3 (three) times  daily.  Depression, major, single episode, severe (HCC) -     buPROPion (WELLBUTRIN) 100 MG tablet; Take 1 tablet (100 mg total) by mouth 2 (two) times daily. -     busPIRone (BUSPAR) 7.5 MG tablet; Take 1 tablet (7.5 mg total) by mouth 3 (three) times daily.  Class 2 severe obesity due to excess calories with serious comorbidity and body mass index (BMI) of 38.0 to 38.9 in adult (HCC) -     Hemoglobin A1c -     TSH  Acanthosis nigricans -     Hemoglobin A1c  Adjustment insomnia        Problem List Items Addressed This Visit      Musculoskeletal and Integument   Acanthosis nigricans   Relevant Orders   Hemoglobin A1c     Other   Class 2 severe obesity due to excess calories with serious comorbidity and body mass index (BMI) of 38.0 to 38.9 in adult (HCC)    Abnormal lipid panel, prediabetes and acanthosis nigricans. Advised about the importance of heart healthy diet and daily exercise to promote weight loss, decreased risk for cardiovascular disease and improve mood. Provided printed material on recommended exercise and diet She declined referral to weight management clinic due work schedule and childcare. Repeat HgbA1c, lipid panel and TSH. F/up in 71months      Relevant Orders   Hemoglobin A1c   TSH   Depression, major, single episode, severe (HCC)    Improved mood with buspar and wellbutrin Continues therapy sessions F/up in 73month.      Relevant Medications   buPROPion (WELLBUTRIN) 100 MG tablet   busPIRone (BUSPAR) 7.5 MG tablet   GAD (generalized anxiety disorder)   Relevant Medications   buPROPion (WELLBUTRIN) 100 MG tablet   busPIRone (BUSPAR) 7.5 MG tablet   Insomnia    Advised need for behavioral changes: bedtime routine, avoid electronics, avoid caffeine, use medication and music, and daily exercise. No hypnotic at this time F/up in 89month      Mixed hyperlipidemia   Relevant Orders   Lipid panel   Prediabetes   Relevant Orders   Hemoglobin  A1c   Vitamin D deficiency   Relevant Orders   Vitamin D 1,25 dihydroxy    Other Visit Diagnoses    Encounter for preventative adult health care exam with abnormal findings    -  Primary   Relevant Orders   Comprehensive metabolic panel   CBC      Follow up: Return in about 3 months (around 04/26/2020) for anxiety and depression (F2F or video, ).  Alysia Penna, NP

## 2020-01-27 NOTE — Assessment & Plan Note (Signed)
Advised need for behavioral changes: bedtime routine, avoid electronics, avoid caffeine, use medication and music, and daily exercise. No hypnotic at this time F/up in 92month

## 2020-01-27 NOTE — Patient Instructions (Addendum)
Sign medical release to get up to date PAP from GYN  Go to lab for blood draw  Schedule appt for dental cleaning every 49months  Start daily exercise regimen.  Ok to use the following supplements: magnesium malate 350-650mg  daily  Exercising to Lose Weight Exercise is structured, repetitive physical activity to improve fitness and health. Getting regular exercise is important for everyone. It is especially important if you are overweight. Being overweight increases your risk of heart disease, stroke, diabetes, high blood pressure, and several types of cancer. Reducing your calorie intake and exercising can help you lose weight. Exercise is usually categorized as moderate or vigorous intensity. To lose weight, most people need to do a certain amount of moderate-intensity or vigorous-intensity exercise each week. Moderate-intensity exercise  Moderate-intensity exercise is any activity that gets you moving enough to burn at least three times more energy (calories) than if you were sitting. Examples of moderate exercise include:  Walking a mile in 15 minutes.  Doing light yard work.  Biking at an easy pace. Most people should get at least 150 minutes (2 hours and 30 minutes) a week of moderate-intensity exercise to maintain their body weight. Vigorous-intensity exercise Vigorous-intensity exercise is any activity that gets you moving enough to burn at least six times more calories than if you were sitting. When you exercise at this intensity, you should be working hard enough that you are not able to carry on a conversation. Examples of vigorous exercise include:  Running.  Playing a team sport, such as football, basketball, and soccer.  Jumping rope. Most people should get at least 75 minutes (1 hour and 15 minutes) a week of vigorous-intensity exercise to maintain their body weight. How can exercise affect me? When you exercise enough to burn more calories than you eat, you lose  weight. Exercise also reduces body fat and builds muscle. The more muscle you have, the more calories you burn. Exercise also:  Improves mood.  Reduces stress and tension.  Improves your overall fitness, flexibility, and endurance.  Increases bone strength. The amount of exercise you need to lose weight depends on:  Your age.  The type of exercise.  Any health conditions you have.  Your overall physical ability. Talk to your health care provider about how much exercise you need and what types of activities are safe for you. What actions can I take to lose weight? Nutrition   Make changes to your diet as told by your health care provider or diet and nutrition specialist (dietitian). This may include: ? Eating fewer calories. ? Eating more protein. ? Eating less unhealthy fats. ? Eating a diet that includes fresh fruits and vegetables, whole grains, low-fat dairy products, and lean protein. ? Avoiding foods with added fat, salt, and sugar.  Drink plenty of water while you exercise to prevent dehydration or heat stroke. Activity  Choose an activity that you enjoy and set realistic goals. Your health care provider can help you make an exercise plan that works for you.  Exercise at a moderate or vigorous intensity most days of the week. ? The intensity of exercise may vary from person to person. You can tell how intense a workout is for you by paying attention to your breathing and heartbeat. Most people will notice their breathing and heartbeat get faster with more intense exercise.  Do resistance training twice each week, such as: ? Push-ups. ? Sit-ups. ? Lifting weights. ? Using resistance bands.  Getting short amounts of exercise can  be just as helpful as long structured periods of exercise. If you have trouble finding time to exercise, try to include exercise in your daily routine. ? Get up, stretch, and walk around every 30 minutes throughout the day. ? Go for a walk  during your lunch break. ? Park your car farther away from your destination. ? If you take public transportation, get off one stop early and walk the rest of the way. ? Make phone calls while standing up and walking around. ? Take the stairs instead of elevators or escalators.  Wear comfortable clothes and shoes with good support.  Do not exercise so much that you hurt yourself, feel dizzy, or get very short of breath. Where to find more information  [place] Department of Health and Human Services: ThisPath.fi  Centers for Disease Control and Prevention (CDC): FootballExhibition.com.br Contact a health care provider:  Before starting a new exercise program.  If you have questions or concerns about your weight.  If you have a medical problem that keeps you from exercising. Get help right away if you have any of the following while exercising:  Injury.  Dizziness.  Difficulty breathing or shortness of breath that does not go away when you stop exercising.  Chest pain.  Rapid heartbeat. Summary  Being overweight increases your risk of heart disease, stroke, diabetes, high blood pressure, and several types of cancer.  Losing weight happens when you burn more calories than you eat.  Reducing the amount of calories you eat in addition to getting regular moderate or vigorous exercise each week helps you lose weight. This information is not intended to replace advice given to you by your health care provider. Make sure you discuss any questions you have with your health care provider. Document Revised: 02/18/2017 Document Reviewed: 02/18/2017 Elsevier Patient Education  2020 Elsevier Inc.   Insomnia Insomnia is a sleep disorder that makes it difficult to fall asleep or stay asleep. Insomnia can cause fatigue, low energy, difficulty concentrating, mood swings, and poor performance at work or school. There are three different ways to classify insomnia:  Difficulty falling asleep.  Difficulty  staying asleep.  Waking up too early in the morning. Any type of insomnia can be long-term (chronic) or short-term (acute). Both are common. Short-term insomnia usually lasts for three months or less. Chronic insomnia occurs at least three times a week for longer than three months. What are the causes? Insomnia may be caused by another condition, situation, or substance, such as:  Anxiety.  Certain medicines.  Gastroesophageal reflux disease (GERD) or other gastrointestinal conditions.  Asthma or other breathing conditions.  Restless legs syndrome, sleep apnea, or other sleep disorders.  Chronic pain.  Menopause.  Stroke.  Abuse of alcohol, tobacco, or illegal drugs.  Mental health conditions, such as depression.  Caffeine.  Neurological disorders, such as Alzheimer's disease.  An overactive thyroid (hyperthyroidism). Sometimes, the cause of insomnia may not be known. What increases the risk? Risk factors for insomnia include:  Gender. Women are affected more often than men.  Age. Insomnia is more common as you get older.  Stress.  Lack of exercise.  Irregular work schedule or working night shifts.  Traveling between different time zones.  Certain medical and mental health conditions. What are the signs or symptoms? If you have insomnia, the main symptom is having trouble falling asleep or having trouble staying asleep. This may lead to other symptoms, such as:  Feeling fatigued or having low energy.  Feeling nervous about  going to sleep.  Not feeling rested in the morning.  Having trouble concentrating.  Feeling irritable, anxious, or depressed. How is this diagnosed? This condition may be diagnosed based on:  Your symptoms and medical history. Your health care provider may ask about: ? Your sleep habits. ? Any medical conditions you have. ? Your mental health.  A physical exam. How is this treated? Treatment for insomnia depends on the cause.  Treatment may focus on treating an underlying condition that is causing insomnia. Treatment may also include:  Medicines to help you sleep.  Counseling or therapy.  Lifestyle adjustments to help you sleep better. Follow these instructions at home: Eating and drinking   Limit or avoid alcohol, caffeinated beverages, and cigarettes, especially close to bedtime. These can disrupt your sleep.  Do not eat a large meal or eat spicy foods right before bedtime. This can lead to digestive discomfort that can make it hard for you to sleep. Sleep habits   Keep a sleep diary to help you and your health care provider figure out what could be causing your insomnia. Write down: ? When you sleep. ? When you wake up during the night. ? How well you sleep. ? How rested you feel the next day. ? Any side effects of medicines you are taking. ? What you eat and drink.  Make your bedroom a dark, comfortable place where it is easy to fall asleep. ? Put up shades or blackout curtains to block light from outside. ? Use a white noise machine to block noise. ? Keep the temperature cool.  Limit screen use before bedtime. This includes: ? Watching TV. ? Using your smartphone, tablet, or computer.  Stick to a routine that includes going to bed and waking up at the same times every day and night. This can help you fall asleep faster. Consider making a quiet activity, such as reading, part of your nighttime routine.  Try to avoid taking naps during the day so that you sleep better at night.  Get out of bed if you are still awake after 15 minutes of trying to sleep. Keep the lights down, but try reading or doing a quiet activity. When you feel sleepy, go back to bed. General instructions  Take over-the-counter and prescription medicines only as told by your health care provider.  Exercise regularly, as told by your health care provider. Avoid exercise starting several hours before bedtime.  Use relaxation  techniques to manage stress. Ask your health care provider to suggest some techniques that may work well for you. These may include: ? Breathing exercises. ? Routines to release muscle tension. ? Visualizing peaceful scenes.  Make sure that you drive carefully. Avoid driving if you feel very sleepy.  Keep all follow-up visits as told by your health care provider. This is important. Contact a health care provider if:  You are tired throughout the day.  You have trouble in your daily routine due to sleepiness.  You continue to have sleep problems, or your sleep problems get worse. Get help right away if:  You have serious thoughts about hurting yourself or someone else. If you ever feel like you may hurt yourself or others, or have thoughts about taking your own life, get help right away. You can go to your nearest emergency department or call:  Your local emergency services (911 in the [place]  A suicide crisis helpline, such as the National Suicide Prevention Lifeline at 702-285-0837. This is open 24 hours a day.  Summary  Insomnia is a sleep disorder that makes it difficult to fall asleep or stay asleep.  Insomnia can be long-term (chronic) or short-term (acute).  Treatment for insomnia depends on the cause. Treatment may focus on treating an underlying condition that is causing insomnia.  Keep a sleep diary to help you and your health care provider figure out what could be causing your insomnia. This information is not intended to replace advice given to you by your health care provider. Make sure you discuss any questions you have with your health care provider. Document Revised: 01/18/2017 Document Reviewed: 11/15/2016 Elsevier Patient Education  2020 [employer]

## 2020-01-27 NOTE — Assessment & Plan Note (Signed)
Improved mood with buspar and wellbutrin Continues therapy sessions F/up in 49month.

## 2020-01-30 LAB — VITAMIN D 1,25 DIHYDROXY
Vitamin D 1, 25 (OH)2 Total: 44 pg/mL (ref 18–72)
Vitamin D2 1, 25 (OH)2: <8 pg/mL
Vitamin D3 1, 25 (OH)2: 44 pg/mL

## 2020-03-01 MED FILL — busPIRone HCL 7.5 MG TABS: 7.5 | 30 days supply | Qty: 90 | Fill #1

## 2020-03-01 MED FILL — buPROPion HCL 100 MG TABS: 100 | 30 days supply | Qty: 60 | Fill #0

## 2020-03-21 ENCOUNTER — Encounter: Payer: Self-pay | Admitting: Nurse Practitioner

## 2020-03-21 ENCOUNTER — Telehealth: Payer: Self-pay

## 2020-03-21 DIAGNOSIS — A6004 Herpesviral vulvovaginitis: Secondary | ICD-10-CM

## 2020-03-21 MED ORDER — VALACYCLOVIR HCL 500 MG PO TABS: 500.0000 mg | ORAL_TABLET | Freq: Two times a day (BID) | ORAL | 0 refills | Status: DC

## 2020-03-21 NOTE — Telephone Encounter (Signed)
Pt calling for a refill of medication of Valtrex 500mg . Medication not on current med list.  Please advise. [phone]

## 2020-03-21 NOTE — Telephone Encounter (Signed)
Pt states she use to get Rx from her OBGYN and has had issues with flare ups for years and is currently having a flare up and would like a Rx. Please advise

## 2020-04-27 MED FILL — busPIRone HCL 7.5 MG TABS: 7.5 | 30 days supply | Qty: 90 | Fill #0

## 2020-05-06 ENCOUNTER — Telehealth: Payer: Self-pay

## 2020-05-06 DIAGNOSIS — A6004 Herpesviral vulvovaginitis: Secondary | ICD-10-CM

## 2020-05-06 NOTE — Telephone Encounter (Signed)
Pt calling in regards to a refill for Valtrex. Pt explains that she is having a outbreak now and also would like to start taking medication daily verses PRN.  Pharmacy of choice has been changed per pt. Please advise.

## 2020-05-09 ENCOUNTER — Other Ambulatory Visit: Payer: Self-pay | Admitting: Nurse Practitioner

## 2020-05-09 ENCOUNTER — Telehealth: Payer: Self-pay

## 2020-05-09 MED ORDER — VALACYCLOVIR HCL 500 MG PO TABS: 500.0000 mg | ORAL_TABLET | Freq: Every day | ORAL | 1 refills | Status: DC

## 2020-05-09 MED FILL — VALACYCLOVIR HCL 500 MG TAB: 500 | 30 days supply | Qty: 30 | Fill #0

## 2020-05-09 NOTE — Telephone Encounter (Signed)
Pt states over the weekend she did get with a Dr. On [facility] and they prescribed her 1 gram of valtrex and give her a 30 day supply and patient wanted to know if it was okay to take that Rx and then after it is completed take the 500 mg you prescribed daily to prevent out breaks?

## 2020-05-09 NOTE — Telephone Encounter (Signed)
Patient notified and verbalized understanding. Pt changed pharmacy and would like Rx sent to updated pharmacy.

## 2020-05-10 NOTE — Telephone Encounter (Signed)
Use high dose during an outbreak: 1000mg  BID x 3-5days, then switch to 500mg  daily continuously.

## 2020-05-10 NOTE — Telephone Encounter (Signed)
LVM for patient to return call. 

## 2020-05-12 NOTE — Telephone Encounter (Signed)
Patient notified VIA phone.  No questions.  Dm/cma ? ?

## 2020-05-13 ENCOUNTER — Ambulatory Visit: Payer: 59 | Admitting: Nurse Practitioner

## 2020-05-20 ENCOUNTER — Ambulatory Visit: Payer: 59 | Admitting: Nurse Practitioner

## 2020-05-26 ENCOUNTER — Other Ambulatory Visit (HOSPITAL_COMMUNITY): Payer: Self-pay

## 2020-06-09 ENCOUNTER — Other Ambulatory Visit (HOSPITAL_COMMUNITY): Payer: Self-pay

## 2020-06-09 MED FILL — Buspirone HCl Tab 7.5 MG: ORAL | 30 days supply | Qty: 90 | Fill #0 | Status: AC

## 2020-06-21 ENCOUNTER — Other Ambulatory Visit: Payer: Self-pay

## 2020-06-22 ENCOUNTER — Ambulatory Visit (INDEPENDENT_AMBULATORY_CARE_PROVIDER_SITE_OTHER): Payer: BC Managed Care – PPO | Admitting: Nurse Practitioner

## 2020-06-22 ENCOUNTER — Other Ambulatory Visit (HOSPITAL_COMMUNITY): Payer: Self-pay

## 2020-06-22 ENCOUNTER — Encounter: Payer: Self-pay | Admitting: Nurse Practitioner

## 2020-06-22 VITALS — BP 112/80 | HR 94 | Temp 98.2°F | Ht 64.0 in | Wt 224.8 lb

## 2020-06-22 DIAGNOSIS — K219 Gastro-esophageal reflux disease without esophagitis: Secondary | ICD-10-CM | POA: Insufficient documentation

## 2020-06-22 DIAGNOSIS — R7303 Prediabetes: Secondary | ICD-10-CM | POA: Diagnosis not present

## 2020-06-22 DIAGNOSIS — F411 Generalized anxiety disorder: Secondary | ICD-10-CM

## 2020-06-22 DIAGNOSIS — F322 Major depressive disorder, single episode, severe without psychotic features: Secondary | ICD-10-CM | POA: Diagnosis not present

## 2020-06-22 DIAGNOSIS — F339 Major depressive disorder, recurrent, unspecified: Secondary | ICD-10-CM

## 2020-06-22 DIAGNOSIS — A6000 Herpesviral infection of urogenital system, unspecified: Secondary | ICD-10-CM | POA: Insufficient documentation

## 2020-06-22 LAB — POCT GLYCOSYLATED HEMOGLOBIN (HGB A1C): Hemoglobin A1C: 5.5 % (ref 4.0–5.6)

## 2020-06-22 MED ORDER — SERTRALINE HCL 25 MG PO TABS
25.0000 mg | ORAL_TABLET | Freq: Every day | ORAL | 5 refills | Status: DC
  Filled 2020-06-22: qty 30, 30d supply, fill #0
  Filled 2020-07-22: qty 30, 30d supply, fill #1
  Filled 2020-09-05: qty 30, 30d supply, fill #2

## 2020-06-22 MED ORDER — BUPROPION HCL 100 MG PO TABS
100.0000 mg | ORAL_TABLET | Freq: Two times a day (BID) | ORAL | 3 refills | Status: DC
  Filled 2020-06-22: qty 180, 90d supply, fill #0

## 2020-06-22 NOTE — Patient Instructions (Addendum)
Today's HgbA1c of 5.5% (normal) D/c buspar. Start zoloft 25mg  daily Continue wellbutrin Start daily exercise ( ) and maintain DASH diet. F/up in 25month

## 2020-06-22 NOTE — Assessment & Plan Note (Signed)
Waxing and waning, worsening anxiety with new job (working from home) Takes welbutrin as precribed Takes buspar BID. Needs to find another therapist, current therapist is retiring.  Stop buspar Start zoloft 25mg  daily Continue wellbutrin F/up in 37month

## 2020-06-22 NOTE — Progress Notes (Signed)
Subjective:  Patient ID: Amy [name], female    DOB: [birth year]  Age: 35 y.o. MRN: [id number]  CC: Follow-up (3 month f/u on anxiety and depression. )  HPI  Depression, recurrent (HCC) Waxing and waning, worsening anxiety with new job (working from home) Takes welbutrin as precribed Takes buspar BID. Needs to find another therapist, current therapist is retiring.  Stop buspar Start zoloft 25mg  daily Continue wellbutrin F/up in 43month  Prediabetes hgbA1c of 5.5% today Advised about importance of weight loss through DASH diet, decrease daily calorie intake and daily exercise. Repeat in 25months  Wt Readings from Last 3 Encounters:  06/22/20 224 lb 12.8 oz (102 kg)  01/27/20 226 lb 3.2 oz (102.6 kg)  11/06/19 221 lb (100.2 kg)   BP Readings from Last 3 Encounters:  06/22/20 112/80  01/27/20 108/70  07/02/19 128/68   Depression screen PHQ 2/9 06/22/2020 01/27/2020 09/28/2019  Decreased Interest 0 0 1  Down, Depressed, Hopeless 1 0 2  PHQ - 2 Score 1 0 3  Altered sleeping 0 2 3  Tired, decreased energy 1 1 1   Change in appetite 0 0 0  Feeling bad or failure about yourself  1 0 2  Trouble concentrating 0 0 1  Moving slowly or fidgety/restless 1 0 2  Suicidal thoughts 0 0 0  PHQ-9 Score 4 3 12   Difficult doing work/chores Somewhat difficult Not difficult at all -   GAD 7 : Generalized Anxiety Score 06/22/2020 01/27/2020 09/28/2019 07/02/2019  Nervous, Anxious, on Edge 1 1 3 1   Control/stop worrying 1 1 3 1   Worry too much - different things 1 2 3 2   Trouble relaxing 0 0 2 2  Restless 0 0 2 0  Easily annoyed or irritable 0 0 0 0  Afraid - awful might happen 0 0 1 1  Total GAD 7 Score 3 4 14 7   Anxiety Difficulty Not difficult at all Not difficult at all - Very difficult   Reviewed past Medical, Social and Family history today.  Outpatient Medications Prior to Visit  Medication Sig Dispense Refill  . Etonogestrel (NEXPLANON [place]) Inject into the skin. 3 years    .  valACYclovir (VALTREX) 500 MG tablet TAKE 1 TABLET BY MOUTH ONCE DAILY 90 tablet 1  . buPROPion (WELLBUTRIN) 100 MG tablet Take 1 tablet (100 mg total) by mouth 2 (two) times daily. 180 tablet 3  . busPIRone (BUSPAR) 7.5 MG tablet TAKE 1 TABLET BY MOUTH 3 TIMES DAILY 90 tablet 11   No facility-administered medications prior to visit.    ROS See HPI  Objective:  BP 112/80 (BP Location: Left Arm, Patient Position: Sitting, Cuff Size: Normal)   Pulse 94   Temp 98.2 F (36.8 C) (Temporal)   Ht 5\' 4"  (1.626 m)   Wt 224 lb 12.8 oz (102 kg)   SpO2 99%   Breastfeeding No   BMI 38.59 kg/m   Physical Exam Vitals reviewed.  Constitutional:      Appearance: She is obese.  Neck:     Thyroid: No thyroid mass, thyromegaly or thyroid tenderness.  Cardiovascular:     Rate and Rhythm: Normal rate.     Pulses: Normal pulses.  Pulmonary:     Effort: Pulmonary effort is normal.  Musculoskeletal:     Cervical back: Normal range of motion and neck supple.  Lymphadenopathy:     Cervical: No cervical adenopathy.  Neurological:     Mental Status: She is alert.  Psychiatric:  Attention and Perception: Attention normal.        Mood and Affect: Mood normal.        Speech: Speech normal.        Behavior: Behavior is cooperative.        Thought Content: Thought content normal.        Cognition and Memory: Cognition normal.        Judgment: Judgment normal.     Assessment & Plan:  This visit occurred during the SARS-CoV-2 public health emergency.  Safety protocols were in place, including screening questions prior to the visit, additional usage of staff PPE, and extensive cleaning of exam room while observing appropriate contact time as indicated for disinfecting solutions.   Amy [name] was seen today for follow-up.  Diagnoses and all orders for this visit:  GAD (generalized anxiety disorder) -     sertraline (ZOLOFT) 25 MG tablet; Take 1 tablet (25 mg total) by mouth  daily.  Prediabetes -     POCT glycosylated hemoglobin (Hb A1C)  Depression, recurrent (HCC) -     sertraline (ZOLOFT) 25 MG tablet; Take 1 tablet (25 mg total) by mouth daily.  Depression, major, single episode, severe (HCC) -     buPROPion (WELLBUTRIN) 100 MG tablet; Take 1 tablet (100 mg total) by mouth 2 (two) times daily.   Problem List Items Addressed This Visit      Other   Depression, recurrent (HCC)    Waxing and waning, worsening anxiety with new job (working from home) Takes welbutrin as precribed Takes buspar BID. Needs to find another therapist, current therapist is retiring.  Stop buspar Start zoloft 25mg  daily Continue wellbutrin F/up in 93month      Relevant Medications   sertraline (ZOLOFT) 25 MG tablet   buPROPion (WELLBUTRIN) 100 MG tablet   GAD (generalized anxiety disorder) - Primary   Relevant Medications   sertraline (ZOLOFT) 25 MG tablet   buPROPion (WELLBUTRIN) 100 MG tablet   Prediabetes    hgbA1c of 5.5% today Advised about importance of weight loss through DASH diet, decrease daily calorie intake and daily exercise. Repeat in 59months      Relevant Orders   POCT glycosylated hemoglobin (Hb A1C)    Other Visit Diagnoses    Depression, major, single episode, severe (HCC)       Relevant Medications   sertraline (ZOLOFT) 25 MG tablet   buPROPion (WELLBUTRIN) 100 MG tablet      Follow-up: Return in about 4 weeks (around 07/20/2020) for anxiety and depression (video or F2F).  09/19/2020, NP

## 2020-06-22 NOTE — Assessment & Plan Note (Signed)
hgbA1c of 5.5% today Advised about importance of weight loss through DASH diet, decrease daily calorie intake and daily exercise. Repeat in 75months

## 2020-06-29 ENCOUNTER — Ambulatory Visit (INDEPENDENT_AMBULATORY_CARE_PROVIDER_SITE_OTHER): Payer: BC Managed Care – PPO | Admitting: Psychology

## 2020-06-29 DIAGNOSIS — F320 Major depressive disorder, single episode, mild: Secondary | ICD-10-CM

## 2020-07-22 ENCOUNTER — Other Ambulatory Visit (HOSPITAL_COMMUNITY): Payer: Self-pay

## 2020-07-23 ENCOUNTER — Other Ambulatory Visit (HOSPITAL_COMMUNITY): Payer: Self-pay

## 2020-08-01 ENCOUNTER — Other Ambulatory Visit: Payer: Self-pay

## 2020-08-02 ENCOUNTER — Ambulatory Visit (INDEPENDENT_AMBULATORY_CARE_PROVIDER_SITE_OTHER): Payer: No Typology Code available for payment source | Admitting: Nurse Practitioner

## 2020-08-02 ENCOUNTER — Other Ambulatory Visit (HOSPITAL_COMMUNITY): Payer: Self-pay

## 2020-08-02 ENCOUNTER — Encounter: Payer: Self-pay | Admitting: Nurse Practitioner

## 2020-08-02 VITALS — BP 112/62 | HR 83 | Temp 98.8°F | Ht 64.0 in

## 2020-08-02 DIAGNOSIS — F411 Generalized anxiety disorder: Secondary | ICD-10-CM | POA: Diagnosis not present

## 2020-08-02 DIAGNOSIS — F339 Major depressive disorder, recurrent, unspecified: Secondary | ICD-10-CM

## 2020-08-02 MED ORDER — ALPRAZOLAM 0.5 MG PO TABS: 0.2500 mg | ORAL_TABLET | Freq: Every day | ORAL | 0 refills | Status: DC | PRN

## 2020-08-02 MED ORDER — ALPRAZOLAM 0.5 MG PO TABS
0.2500 mg | ORAL_TABLET | Freq: Every day | ORAL | 0 refills | Status: DC | PRN
  Filled 2020-08-02: qty 4, 4d supply, fill #0

## 2020-08-02 NOTE — Progress Notes (Signed)
Subjective:  Patient ID: Amy [name], female    DOB: 1985/10/09  Age: 35 y.o. MRN: [id number]  CC: Follow-up (4 Week follow up depression)  HPI  Depression, recurrent (HCC) Stable mood with Wellbutrin and sertraline Denies any adverse side effects Has not made appt with therapist.  maintain current medication dose.  GAD (generalized anxiety disorder) Stable mood Has plane trip to [place] in 2days. She has a fear of flying, so it is emotionally distressing to complete her trip. Alprazolam rx sent Maintain wellbutrin and zoloft dose  Depression screen [facility] 2/9 06/22/2020 01/27/2020 09/28/2019  Decreased Interest 0 0 1  Down, Depressed, Hopeless 1 0 2  PHQ - 2 Score 1 0 3  Altered sleeping 0 2 3  Tired, decreased energy 1 1 1   Change in appetite 0 0 0  Feeling bad or failure about yourself  1 0 2  Trouble concentrating 0 0 1  Moving slowly or fidgety/restless 1 0 2  Suicidal thoughts 0 0 0  PHQ-9 Score 4 3 12   Difficult doing work/chores Somewhat difficult Not difficult at all -    GAD 7 : Generalized Anxiety Score 06/22/2020 01/27/2020 09/28/2019 07/02/2019  Nervous, Anxious, on Edge 1 1 3 1   Control/stop worrying 1 1 3 1   Worry too much - different things 1 2 3 2   Trouble relaxing 0 0 2 2  Restless 0 0 2 0  Easily annoyed or irritable 0 0 0 0  Afraid - awful might happen 0 0 1 1  Total GAD 7 Score 3 4 14 7   Anxiety Difficulty Not difficult at all Not difficult at all - Very difficult     Wt Readings from Last 3 Encounters:  06/22/20 224 lb 12.8 oz (102 kg)  01/27/20 226 lb 3.2 oz (102.6 kg)  11/06/19 221 lb (100.2 kg)    Reviewed past Medical, Social and Family history today.  Outpatient Medications Prior to Visit  Medication Sig Dispense Refill   buPROPion (WELLBUTRIN) 100 MG tablet Take 1 tablet (100 mg total) by mouth 2 (two) times daily. 180 tablet 3   Etonogestrel (NEXPLANON [place]) Inject into the skin. 3 years     sertraline (ZOLOFT) 25 MG tablet Take 1 tablet  (25 mg total) by mouth daily. 30 tablet 5   valACYclovir (VALTREX) 500 MG tablet TAKE 1 TABLET BY MOUTH ONCE DAILY 90 tablet 1   amoxicillin (AMOXIL) 500 MG tablet Take 500 mg by mouth 3 (three) times daily.     No facility-administered medications prior to visit.    ROS See HPI  Objective:  BP 112/62   Pulse 83   Temp 98.8 F (37.1 C)   Ht 5\' 4"  (1.626 m)   SpO2 98%   BMI 38.59 kg/m   Physical Exam Constitutional:      General: She is not in acute distress. Cardiovascular:     Rate and Rhythm: Normal rate.     Pulses: Normal pulses.  Pulmonary:     Effort: Pulmonary effort is normal.  Neurological:     Mental Status: She is alert and oriented to person, place, and time.  Psychiatric:        Mood and Affect: Mood normal.        Behavior: Behavior normal.        Thought Content: Thought content normal.    Assessment & Plan:  This visit occurred during the SARS-CoV-2 public health emergency.  Safety protocols were in place, including screening questions prior  to the visit, additional usage of staff PPE, and extensive cleaning of exam room while observing appropriate contact time as indicated for disinfecting solutions.   [name] was seen today for follow-up.  Diagnoses and all orders for this visit:  GAD (generalized anxiety disorder) -     Discontinue: ALPRAZolam (XANAX) 0.5 MG tablet; Take 0.5-1 tablets (0.25-0.5 mg total) by mouth daily as needed for anxiety ( prior to flight). -     ALPRAZolam (XANAX) 0.5 MG tablet; Take 0.5-1 tablets (0.25-0.5 mg total) by mouth daily as needed for anxiety ( prior to flight).  Depression, recurrent (HCC)   Problem List Items Addressed This Visit       Other   Depression, recurrent (HCC)    Stable mood with Wellbutrin and sertraline Denies any adverse side effects Has not made appt with therapist.  maintain current medication dose.       Relevant Medications   ALPRAZolam (XANAX) 0.5 MG tablet   GAD  (generalized anxiety disorder) - Primary    Stable mood Has plane trip to [place] in 2days. She has a fear of flying, so it is emotionally distressing to complete her trip. Alprazolam rx sent Maintain wellbutrin and zoloft dose       Relevant Medications   ALPRAZolam (XANAX) 0.5 MG tablet    Follow-up: Return in about 2 months (around 10/02/2020) for anxiety and depression ( ).  Alysia Penna, NP

## 2020-08-02 NOTE — Assessment & Plan Note (Signed)
Stable mood with Wellbutrin and sertraline Denies any adverse side effects Has not made appt with therapist.  maintain current medication dose.

## 2020-08-02 NOTE — Assessment & Plan Note (Signed)
Stable mood Has plane trip to [place] in 2days. She has a fear of flying, so it is emotionally distressing to complete her trip. Alprazolam rx sent Maintain wellbutrin and zoloft dose

## 2020-08-02 NOTE — Patient Instructions (Signed)
Maintain current medication Have a safe trip

## 2020-08-05 ENCOUNTER — Telehealth: Payer: Self-pay | Admitting: Nurse Practitioner

## 2020-08-05 NOTE — Telephone Encounter (Signed)
Pt already has a script for ALPRAZolam (XANAX) 0.5 MG tablet [[id number]. She is saying that .05mg  did not work for her flight. She is wanting to take 2 or 3 .05mg  pills. She would like to know if this is OK for her to do. Please advise pt, she has an upcoming flight on 08/09/20   [phone]

## 2020-08-05 NOTE — Telephone Encounter (Signed)
Patient notified VIA phone and no further questions.  Dm/cma ° °

## 2020-08-05 NOTE — Telephone Encounter (Signed)
Please advise message below  °

## 2020-09-05 ENCOUNTER — Other Ambulatory Visit (HOSPITAL_COMMUNITY): Payer: Self-pay

## 2020-09-06 ENCOUNTER — Other Ambulatory Visit (HOSPITAL_COMMUNITY): Payer: Self-pay

## 2020-09-28 ENCOUNTER — Telehealth: Payer: Self-pay | Admitting: Nurse Practitioner

## 2020-09-28 NOTE — Telephone Encounter (Signed)
Pt is wanting something to help her sleep at night. Her mind is racing. I offered her an appointment for this issue. She wanted a phone message put in first. Please advise

## 2020-09-28 NOTE — Telephone Encounter (Signed)
Spoke to patient VIA phone, scheduled her an appointment on 09/30/20 @ 9:00 am.  Dm/cma

## 2020-09-30 ENCOUNTER — Ambulatory Visit: Payer: No Typology Code available for payment source | Admitting: Nurse Practitioner

## 2020-10-10 ENCOUNTER — Ambulatory Visit: Payer: No Typology Code available for payment source | Admitting: Nurse Practitioner

## 2020-10-20 ENCOUNTER — Ambulatory Visit: Payer: No Typology Code available for payment source | Admitting: Nurse Practitioner

## 2020-11-17 ENCOUNTER — Other Ambulatory Visit: Payer: Self-pay

## 2020-11-18 ENCOUNTER — Encounter: Payer: Self-pay | Admitting: Nurse Practitioner

## 2020-11-18 ENCOUNTER — Ambulatory Visit (INDEPENDENT_AMBULATORY_CARE_PROVIDER_SITE_OTHER): Payer: No Typology Code available for payment source | Admitting: Nurse Practitioner

## 2020-11-18 VITALS — BP 108/70 | HR 90 | Temp 97.6°F | Ht 64.0 in | Wt 221.6 lb

## 2020-11-18 DIAGNOSIS — L810 Postinflammatory hyperpigmentation: Secondary | ICD-10-CM | POA: Insufficient documentation

## 2020-11-18 DIAGNOSIS — Z6838 Body mass index (BMI) 38.0-38.9, adult: Secondary | ICD-10-CM | POA: Diagnosis not present

## 2020-11-18 DIAGNOSIS — F411 Generalized anxiety disorder: Secondary | ICD-10-CM | POA: Diagnosis not present

## 2020-11-18 DIAGNOSIS — Z23 Encounter for immunization: Secondary | ICD-10-CM

## 2020-11-18 DIAGNOSIS — F339 Major depressive disorder, recurrent, unspecified: Secondary | ICD-10-CM | POA: Diagnosis not present

## 2020-11-18 MED ORDER — PEN NEEDLES 31G X 6 MM MISC: 1.0000 "application " | Freq: Every day | 5 refills | Status: DC

## 2020-11-18 MED ORDER — SAXENDA 18 MG/3ML ~~LOC~~ SOPN
PEN_INJECTOR | SUBCUTANEOUS | 5 refills | Status: DC
Start: 2020-11-18 — End: 2021-01-19

## 2020-11-18 MED ORDER — SERTRALINE HCL 25 MG PO TABS: 25.0000 mg | ORAL_TABLET | Freq: Every day | ORAL | 3 refills | Status: DC

## 2020-11-18 NOTE — Assessment & Plan Note (Signed)
Stable mood with meds and CBT. Denies any adverse side effects.  Maintain doses F/up in 73months

## 2020-11-18 NOTE — Progress Notes (Signed)
Subjective:  Patient ID: Amy [name], female    DOB: Mar 09, 1985  Age: 35 y.o. MRN: [id number]  CC: Follow-up (2 month f/u on Anxiety and Depression. Pt states she has notice a little improvement. /Pt states she would also like to discuss weight loss today. )  HPI  GAD (generalized anxiety disorder) Stable mood with meds and CBT. Denies any adverse side effects.  Maintain doses F/up in 38months  Depression, recurrent (HCC) Stable mood Maintain med doses  Obesity She has  Made dietary changes: decreased daily calorie intake She has also increase daily activity: walking and rowing daily, each. She will like to start saxenda injection. We discussed possible side effects. She denied personal hx or FHx of thyroid cancer or acute pancreatitis or gallbladder disease or constipation or GERD. Use of condoms for contracception/  saxenda rx sent Check heapatic panel and lipase in 62month. Depression screen [facility] 2/9 11/18/2020 06/22/2020 01/27/2020  Decreased Interest 1 0 0  Down, Depressed, Hopeless 1 1 0  PHQ - 2 Score 2 1 0  Altered sleeping 1 0 2  Tired, decreased energy 1 1 1   Change in appetite 0 0 0  Feeling bad or failure about yourself  0 1 0  Trouble concentrating 0 0 0  Moving slowly or fidgety/restless 0 1 0  Suicidal thoughts 0 0 0  PHQ-9 Score 4 4 3   Difficult doing work/chores Somewhat difficult Somewhat difficult Not difficult at all    Wt Readings from Last 3 Encounters:  11/18/20 221 lb 9.6 oz (100.5 kg)  06/22/20 224 lb 12.8 oz (102 kg)  01/27/20 226 lb 3.2 oz (102.6 kg)    BP Readings from Last 3 Encounters:  11/18/20 108/70  08/02/20 112/62  06/22/20 112/80    Reviewed past Medical, Social and Family history today.  Outpatient Medications Prior to Visit  Medication Sig Dispense Refill   buPROPion (WELLBUTRIN) 100 MG tablet Take 1 tablet (100 mg total) by mouth 2 (two) times daily. 180 tablet 3   CLINDAMYCIN PHOS & CLEANSER EX Apply topically.      Etonogestrel (NEXPLANON [place]) Inject into the skin. 3 years     Tretinoin 0.05 % LOTN Apply topically.     valACYclovir (VALTREX) 500 MG tablet TAKE 1 TABLET BY MOUTH ONCE DAILY 90 tablet 1   ALPRAZolam (XANAX) 0.5 MG tablet Take 0.5-1 tablets (0.25-0.5 mg total) by mouth daily as needed for anxiety (08/22/20 prior to flight). 4 tablet 0   sertraline (ZOLOFT) 25 MG tablet Take 1 tablet (25 mg total) by mouth daily. 30 tablet 5   amoxicillin (AMOXIL) 500 MG tablet Take 500 mg by mouth 3 (three) times daily. (Patient not taking: Reported on 11/18/2020)     No facility-administered medications prior to visit.    ROS See HPI  Objective:  BP 108/70 (BP Location: Left Arm, Patient Position: Sitting, Cuff Size: Large)   Pulse 90   Temp 97.6 F (36.4 C) (Temporal)   Ht 5\' 4"  (1.626 m)   Wt 221 lb 9.6 oz (100.5 kg)   SpO2 99%   BMI 38.04 kg/m   Physical Exam Vitals reviewed.  Cardiovascular:     Rate and Rhythm: Normal rate.     Pulses: Normal pulses.  Pulmonary:     Effort: Pulmonary effort is normal.  Neurological:     Mental Status: She is alert and oriented to person, place, and time.  Psychiatric:        Mood and Affect: Mood  normal.        Behavior: Behavior normal.        Thought Content: Thought content normal.    Assessment & Plan:  This visit occurred during the SARS-CoV-2 public health emergency.  Safety protocols were in place, including screening questions prior to the visit, additional usage of staff PPE, and extensive cleaning of exam room while observing appropriate contact time as indicated for disinfecting solutions.   Amy [name] was seen today for follow-up.  Diagnoses and all orders for this visit:  Flu vaccine need -     Flu Vaccine QUAD 6+ mos PF IM (Fluarix Quad PF)  GAD (generalized anxiety disorder) -     sertraline (ZOLOFT) 25 MG tablet; Take 1 tablet (25 mg total) by mouth daily.  Depression, recurrent (HCC) -     sertraline (ZOLOFT) 25 MG tablet;  Take 1 tablet (25 mg total) by mouth daily.  Class 2 severe obesity due to excess calories with serious comorbidity and body mass index (BMI) of 38.0 to 38.9 in adult [facility]) -     Hepatic function panel; Future -     Liraglutide -Weight Management (SAXENDA) 18 MG/3ML SOPN; 0.6mg  daily x 71month, then 1.2mg  daily x 54month, then 1.8mg  daily continuously -     Lipase; Future -     Insulin Pen Needle (PEN NEEDLES) 31G X 6 MM MISC; 1 application by Does not apply route at bedtime. For saxenda pen   Problem List Items Addressed This Visit       Other   Depression, recurrent (HCC)    Stable mood Maintain med doses      Relevant Medications   sertraline (ZOLOFT) 25 MG tablet   GAD (generalized anxiety disorder)    Stable mood with meds and CBT. Denies any adverse side effects.  Maintain doses F/up in 47months      Relevant Medications   sertraline (ZOLOFT) 25 MG tablet   Obesity    She has  Made dietary changes: decreased daily calorie intake She has also increase daily activity: walking and rowing daily, each. She will like to start saxenda injection. We discussed possible side effects. She denied personal hx or FHx of thyroid cancer or acute pancreatitis or gallbladder disease or constipation or GERD. Use of condoms for contracception/  saxenda rx sent Check heapatic panel and lipase in 70month.      Relevant Medications   Liraglutide -Weight Management (SAXENDA) 18 MG/3ML SOPN   Insulin Pen Needle (PEN NEEDLES) 31G X 6 MM MISC   Other Relevant Orders   Hepatic function panel   Lipase   Other Visit Diagnoses     Flu vaccine need    -  Primary   Relevant Orders   Flu Vaccine QUAD 6+ mos PF IM (Fluarix Quad PF) (Completed)       Follow-up: Return in about 10 weeks (around 01/27/2021) for CPE (fasting) and weight management.  Alysia Penna, NP

## 2020-11-18 NOTE — Assessment & Plan Note (Signed)
Stable mood Maintain med doses

## 2020-11-18 NOTE — Patient Instructions (Addendum)
Start saxenda injection Return to lab in 18month for hepatic and lipase check.  Maintain other medication doses and appts with therapist.  Liraglutide Injection (Weight Management) What is this medication? LIRAGLUTIDE (LIR a GLOO tide) promotes weight loss. It may also be used to maintain weight loss. It works by decreasing appetite. Changes to diet and exercise are often combined with this medication. This medicine may be used for other purposes; ask your health care provider or pharmacist if you have questions. COMMON BRAND NAME(S): Saxenda What should I tell my care team before I take this medication? They need to know if you have any of these conditions: Endocrine tumors (MEN 2) or if someone in your family had these tumors Gallbladder disease High cholesterol History of alcohol abuse problem History of pancreatitis Kidney disease or if you are on dialysis Liver disease Previous swelling of the tongue, face, or lips with difficulty breathing, difficulty swallowing, hoarseness, or tightening of the throat Stomach problems Suicidal thoughts, plans, or attempt; a previous suicide attempt by you or a family member Thyroid cancer or if someone in your family had thyroid cancer An unusual or allergic reaction to liraglutide, other medications, foods, dyes, or preservatives Pregnant or trying to get pregnant Breast-feeding How should I use this medication? This medication is for injection under the skin of your upper leg, stomach area, or upper arm. You will be taught how to prepare and give this medication. Use exactly as directed. Take your medication at regular intervals. Do not take it more often than directed. This medication comes with INSTRUCTIONS FOR USE. Ask your pharmacist for directions on how to use this medication. Read the information carefully. Talk to your pharmacist or care team if you have questions. It is important that you put your used needles and syringes in a special  sharps container. Do not put them in a trash can. If you do not have a sharps container, call your pharmacist or care team to get one. A special MedGuide will be given to you by the pharmacist with each prescription and refill. Be sure to read this information carefully each time. Talk to your care team about the use of this medication in children. While it may be prescribed for children as young as 5 years of age for selected conditions, precautions do apply. Overdosage: If you think you have taken too much of this medicine contact a poison control center or emergency room at once. NOTE: This medicine is only for you. Do not share this medicine with others. What if I miss a dose? If you miss a dose, take it as soon as you can. If it is almost time for your next dose, take only that dose. Do not take double or extra doses. If you miss your dose for 3 days or more, call your care team to talk about how to restart this medicine. What may interact with this medication? Insulin and other medications for diabetes This list may not describe all possible interactions. Give your health care provider a list of all the medicines, herbs, non-prescription drugs, or dietary supplements you use. Also tell them if you smoke, drink alcohol, or use illegal drugs. Some items may interact with your medicine. What should I watch for while using this medication? Visit your care team for regular checks on your progress. Drink plenty of fluids while taking this medication. Check with your care team if you get an attack of severe diarrhea, nausea, and vomiting. The loss of too much body  fluid can make it dangerous for you to take this medication. This medication may affect blood sugar levels. Ask your care team if changes in diet or medications are needed if you have diabetes. Patients and their families should watch out for worsening depression or thoughts of suicide. Also watch out for sudden changes in feelings such as  feeling anxious, agitated, panicky, irritable, hostile, aggressive, impulsive, severely restless, overly excited and hyperactive, or not being able to sleep. If this happens, especially at the beginning of treatment or after a change in dose, call your care team. Women should inform their care team if they wish to become pregnant or think they might be pregnant. Losing weight while pregnant is not advised and may cause harm to the unborn child. Talk to your care team for more information. What side effects may I notice from receiving this medication? Side effects that you should report to your care team as soon as possible: Allergic reactions or angioedema-skin rash, itching, hives, swelling of the face, eyes, lips, tongue, arms, or legs, trouble swallowing or breathing Fast or irregular heartbeat Gallbladder problems-severe stomach pain, nausea, vomiting, fever Kidney injury-decrease in the amount of urine, swelling of the ankles, hands, or feet Pancreatitis-severe stomach pain that spreads to your back or gets worse after eating or when touched, fever, nausea, vomiting Thoughts of suicide or self-harm, worsening mood, feelings of depression Thyroid cancer-new mass or lump in the neck, pain or trouble swallowing, trouble breathing, hoarseness Side effects that usually do not require medical attention (report to your care team if they continue or are bothersome): Constipation Dizziness Fatigue Headache Loss of Appetite Nausea Upset stomach This list may not describe all possible side effects. Call your doctor for medical advice about side effects. You may report side effects to FDA at 1-800-FDA-1088. Where should I keep my medication? Keep out of the reach of children and pets. Store unopened pen in a refrigerator between 2 and 8 degrees C (36 and 46 degrees F). Do not freeze or use if the medication has been frozen. Protect from light and excessive heat. After you first use the pen, it can be  stored at room temperature between 15 and 30 degrees C (59 and 86 degrees F) or in a refrigerator. Throw away your used pen after 30 days or after the expiration date, whichever comes first. Do not store your pen with the needle attached. If the needle is left on, medication may leak from the pen. NOTE: This sheet is a summary. It may not cover all possible information. If you have questions about this medicine, talk to your doctor, pharmacist, or health care provider.  2022 Elsevier/Gold Standard (2020-02-26 13:54:21)

## 2020-11-18 NOTE — Assessment & Plan Note (Addendum)
She has  Made dietary changes: decreased daily calorie intake She has also increase daily activity: walking and rowing daily, each. She will like to start saxenda injection. We discussed possible side effects. She denied personal hx or FHx of thyroid cancer or acute pancreatitis or gallbladder disease or constipation or GERD. Use of condoms for contracception/  saxenda rx sent Check heapatic panel and lipase in 75month.

## 2020-11-23 ENCOUNTER — Telehealth: Payer: Self-pay

## 2020-11-23 NOTE — Telephone Encounter (Signed)
PA for Saxenda submitted through cover my meds to [employer]. Awaiting response.Marland Kitchen Dm/cma   Key: [id number]

## 2020-11-25 NOTE — Telephone Encounter (Signed)
PA approved from 11/24/20- 03/27/21.  Pharmacy notified VIA phone. Dm/cma

## 2020-12-19 ENCOUNTER — Other Ambulatory Visit: Payer: No Typology Code available for payment source

## 2020-12-20 ENCOUNTER — Encounter (HOSPITAL_COMMUNITY): Payer: Self-pay | Admitting: Emergency Medicine

## 2020-12-20 ENCOUNTER — Emergency Department (HOSPITAL_COMMUNITY)
Admission: EM | Admit: 2020-12-20 | Discharge: 2020-12-20 | Disposition: A | Discharge status: Home / Self Care | Payer: No Typology Code available for payment source | Attending: Emergency Medicine | Admitting: Emergency Medicine

## 2020-12-20 ENCOUNTER — Emergency Department (HOSPITAL_COMMUNITY): Payer: No Typology Code available for payment source

## 2020-12-20 DIAGNOSIS — R7303 Prediabetes: Secondary | ICD-10-CM | POA: Diagnosis not present

## 2020-12-20 DIAGNOSIS — R1013 Epigastric pain: Secondary | ICD-10-CM

## 2020-12-20 DIAGNOSIS — Z794 Long term (current) use of insulin: Secondary | ICD-10-CM | POA: Diagnosis not present

## 2020-12-20 DIAGNOSIS — R112 Nausea with vomiting, unspecified: Secondary | ICD-10-CM | POA: Diagnosis not present

## 2020-12-20 DIAGNOSIS — R1011 Right upper quadrant pain: Secondary | ICD-10-CM | POA: Diagnosis not present

## 2020-12-20 DIAGNOSIS — K2100 Gastro-esophageal reflux disease with esophagitis, without bleeding: Secondary | ICD-10-CM | POA: Diagnosis not present

## 2020-12-20 DIAGNOSIS — R109 Unspecified abdominal pain: Secondary | ICD-10-CM

## 2020-12-20 DIAGNOSIS — Z79899 Other long term (current) drug therapy: Secondary | ICD-10-CM | POA: Insufficient documentation

## 2020-12-20 LAB — LIPASE, BLOOD: Lipase: 32 U/L (ref 11–51)

## 2020-12-20 LAB — COMPREHENSIVE METABOLIC PANEL
ALT: 32 U/L (ref 0–44)
AST: 26 U/L (ref 15–41)
Albumin: 4.1 g/dL (ref 3.5–5.0)
Alkaline Phosphatase: 57 U/L (ref 38–126)
Anion gap: 8 (ref 5–15)
BUN: 13 mg/dL (ref 6–20)
CO2: 23 mmol/L (ref 22–32)
Calcium: 9 mg/dL (ref 8.9–10.3)
Chloride: 104 mmol/L (ref 98–111)
Creatinine, Ser: 0.88 mg/dL (ref 0.44–1.00)
GFR, Estimated: >60 mL/min (ref >60)
Glucose, Bld: 122 mg/dL — ABNORMAL HIGH (ref 70–99)
Potassium: 3.8 mmol/L (ref 3.5–5.1)
Sodium: 135 mmol/L (ref 135–145)
Total Bilirubin: 0.7 mg/dL (ref 0.3–1.2)
Total Protein: 8.3 g/dL — ABNORMAL HIGH (ref 6.5–8.1)

## 2020-12-20 LAB — I-STAT BETA HCG BLOOD, ED (MC, WL, AP ONLY): I-stat hCG, quantitative: <5 m[IU]/mL (ref <5)

## 2020-12-20 LAB — CBC
HCT: 41.4 % (ref 36.0–46.0)
Hemoglobin: 13.6 g/dL (ref 12.0–15.0)
MCH: 29.3 pg (ref 26.0–34.0)
MCHC: 32.9 g/dL (ref 30.0–36.0)
MCV: 89.2 fL (ref 80.0–100.0)
Platelets: 293 10*3/uL (ref 150–400)
RBC: 4.64 MIL/uL (ref 3.87–5.11)
RDW: 13.3 % (ref 11.5–15.5)
WBC: 7.6 10*3/uL (ref 4.0–10.5)
nRBC: 0 % (ref 0.0–0.2)

## 2020-12-20 LAB — URINALYSIS, ROUTINE W REFLEX MICROSCOPIC
Bilirubin Urine: NEGATIVE
Glucose, UA: NEGATIVE mg/dL
Hgb urine dipstick: NEGATIVE
Ketones, ur: 80 mg/dL — AB
Leukocytes,Ua: NEGATIVE
Nitrite: NEGATIVE
Protein, ur: NEGATIVE mg/dL
Specific Gravity, Urine: 1.021 (ref 1.005–1.030)
pH: 6 (ref 5.0–8.0)

## 2020-12-20 LAB — TROPONIN I (HIGH SENSITIVITY): Troponin I (High Sensitivity): <2 ng/L (ref <18)

## 2020-12-20 MED ORDER — ONDANSETRON 4 MG PO TBDP
4.0000 mg | ORAL_TABLET | Freq: Once | ORAL | Status: AC
  Administered 2020-12-20: 4 mg via ORAL
  Filled 2020-12-20: qty 1

## 2020-12-20 MED ORDER — SODIUM CHLORIDE 0.9 % IV BOLUS
1000.0000 mL | Freq: Once | INTRAVENOUS | Status: AC
Start: 2020-12-20 — End: 2020-12-20
  Administered 2020-12-20: 1000 mL via INTRAVENOUS

## 2020-12-20 MED ORDER — MORPHINE SULFATE (PF) 4 MG/ML IV SOLN
4.0000 mg | Freq: Once | INTRAVENOUS | Status: AC
  Administered 2020-12-20: 4 mg via INTRAVENOUS
  Filled 2020-12-20: qty 1

## 2020-12-20 MED ORDER — LIDOCAINE VISCOUS HCL 2 % MT SOLN
15.0000 mL | Freq: Once | OROMUCOSAL | Status: AC
  Administered 2020-12-20: 15 mL via ORAL
  Filled 2020-12-20: qty 15

## 2020-12-20 MED ORDER — ALUM & MAG HYDROXIDE-SIMETH 200-200-20 MG/5ML PO SUSP
30.0000 mL | Freq: Once | ORAL | Status: AC
  Administered 2020-12-20: 30 mL via ORAL
  Filled 2020-12-20: qty 30

## 2020-12-20 MED ORDER — ONDANSETRON HCL 4 MG PO TABS: 4.0000 mg | ORAL_TABLET | Freq: Four times a day (QID) | ORAL | 0 refills | Status: DC

## 2020-12-20 MED ORDER — DICYCLOMINE HCL 10 MG/5ML PO SOLN
10.0000 mg | Freq: Once | ORAL | Status: DC
  Filled 2020-12-20: qty 5

## 2020-12-20 MED ORDER — METOCLOPRAMIDE HCL 5 MG/ML IJ SOLN
10.0000 mg | Freq: Once | INTRAMUSCULAR | Status: AC
  Administered 2020-12-20: 10 mg via INTRAVENOUS
  Filled 2020-12-20: qty 2

## 2020-12-20 NOTE — ED Provider Notes (Signed)
[facility] DEPT Provider Note   CSN: [id number] Arrival date & time: 12/20/20  [id number]     History Chief Complaint  Patient presents with   Abdominal Pain    Amy [name] is a 35 y.o. female.  Patient is a past medical history of prediabetes, hyperlipidemia, obesity, GERD.  She presents today with epigastric pain that woke her up from sleep around 3 AM.  She says that the pain is gradually worsened.  She is taken antacids and Maalox with no relief.  She has associated nausea and vomiting.  She states she has never had this pain before.  She denies any history of gallbladder issues.  She states that she ate some beef, mashed potatoes, and watermelon last night.  She denies any fevers or chills, chest pain, shortness of breath, diarrhea. Denies any alcohol use.    Abdominal Pain Associated symptoms: nausea and vomiting   Associated symptoms: no chest pain, no chills, no constipation, no cough, no diarrhea, no dysuria, no fever, no hematuria, no shortness of breath and no sore throat       Past Medical History:  Diagnosis Date   Anxiety    with flying only   GERD (gastroesophageal reflux disease)    Herpes    Vaginal Pap smear, abnormal     Patient Active Problem List   Diagnosis Date Noted   Post-inflammatory hyperpigmentation 11/18/2020   Gastroesophageal reflux disease without esophagitis 06/22/2020   Genital herpes simplex 06/22/2020   Acanthosis nigricans 01/27/2020   Obesity 01/27/2020   GAD (generalized anxiety disorder) 11/06/2019   Mixed hyperlipidemia 12/07/2018   Vitamin D deficiency 03/04/2017   Herpes simplex vulvovaginitis 08/27/2016   Prediabetes 05/31/2016   Insomnia 11/04/2015   Depression, recurrent ([place]) 11/04/2015    Past Surgical History:  Procedure Laterality Date   CERVICAL BIOPSY  W/ LOOP ELECTRODE EXCISION     CESAREAN SECTION N/A 07/20/2018   Procedure: CESAREAN SECTION;  Surgeon: Sanjuana Kava, MD;  Location: [facility];  Service: Obstetrics;  Laterality: N/A;   WISDOM TOOTH EXTRACTION       OB History     Gravida  1   Para  1   Term  1   Preterm      AB      Living  1      SAB      IAB      Ectopic      Multiple  0   Live Births  1           Family History  Problem Relation Age of Onset   Hypertension Mother    Hyperlipidemia Mother    Hyperlipidemia Father    Alcohol abuse Father    Cirrhosis Father    Hypertension Maternal Aunt    Heart disease Paternal Grandmother     Social History   Tobacco Use   Smoking status: Never   Smokeless tobacco: Never  Vaping Use   Vaping Use: Never used  Substance Use Topics   Alcohol use: Not Currently    Comment: social   Drug use: No    Home Medications Prior to Admission medications   Medication Sig Start Date End Date Taking? Authorizing Provider  ondansetron (ZOFRAN) 4 MG tablet Take 1 tablet (4 mg total) by mouth every 6 (six) hours. 12/20/20  Yes Taliyah Watrous, Adora Fridge, PA-C  buPROPion (WELLBUTRIN) 100 MG tablet Take 1 tablet (100 mg total) by mouth 2 (two) times daily. 06/22/20  Nche, Charlene Brooke, NP  CLINDAMYCIN PHOS & CLEANSER EX Apply topically.    [provider]  Etonogestrel (NEXPLANON [place]) Inject into the skin. 3 years    [provider]  Insulin Pen Needle (PEN NEEDLES) 31G X 6 MM MISC 1 application by Does not apply route at bedtime. For saxenda pen 11/18/20   Nche, Charlene Brooke, NP  Liraglutide -Weight Management (SAXENDA) 18 MG/3ML SOPN 0.6mg  daily x 64month, then 1.2mg  daily x 78month, then 1.8mg  daily continuously 11/18/20   Nche, Charlene Brooke, NP  sertraline (ZOLOFT) 25 MG tablet Take 1 tablet (25 mg total) by mouth daily. 11/18/20   Nche, Charlene Brooke, NP  Tretinoin 0.05 % LOTN Apply topically.    [provider]  valACYclovir (VALTREX) 500 MG tablet TAKE 1 TABLET BY MOUTH ONCE DAILY 05/09/20 05/09/21  Nche, Charlene Brooke, NP    Allergies    Patient has no known  allergies.  Review of Systems   Review of Systems  Constitutional:  Negative for chills and fever.  HENT:  Negative for congestion, rhinorrhea and sore throat.   Eyes:  Negative for visual disturbance.  Respiratory:  Negative for cough, chest tightness and shortness of breath.   Cardiovascular:  Negative for chest pain, palpitations and leg swelling.  Gastrointestinal:  Positive for abdominal pain, nausea and vomiting. Negative for abdominal distention, blood in stool, constipation and diarrhea.  Genitourinary:  Negative for dysuria, flank pain and hematuria.  Musculoskeletal:  Negative for back pain.  Skin:  Negative for rash and wound.  Neurological:  Negative for dizziness, syncope, weakness, light-headedness and headaches.  Psychiatric/Behavioral:  Negative for confusion.   All other systems reviewed and are negative.  Physical Exam Updated Vital Signs BP (!) 115/94   Pulse 73   Temp 98 F (36.7 C) (Oral)   Resp 17   LMP 11/19/2020   SpO2 100%   Physical Exam Vitals and nursing note reviewed.  Constitutional:      General: She is in acute distress.     Appearance: Normal appearance. She is well-developed. She is not ill-appearing, toxic-appearing or diaphoretic.     Comments: Patient is tearful.  Appears to be in acute distress due to acute pain.  HENT:     Head: Normocephalic and atraumatic.     Nose: No nasal deformity.     Mouth/Throat:     Lips: Pink. No lesions.     Mouth: Mucous membranes are moist.     Pharynx: Oropharynx is clear. No pharyngeal swelling, oropharyngeal exudate or posterior oropharyngeal erythema.  Eyes:     General: Gaze aligned appropriately. No scleral icterus.       Right eye: No discharge.        Left eye: No discharge.     Conjunctiva/sclera: Conjunctivae normal.     Right eye: Right conjunctiva is not injected. No exudate or hemorrhage.    Left eye: Left conjunctiva is not injected. No exudate or hemorrhage. Cardiovascular:     Rate  and Rhythm: Normal rate and regular rhythm.     Pulses: Normal pulses.     Heart sounds: Normal heart sounds. No murmur heard.   No friction rub. No gallop.  Pulmonary:     Effort: Pulmonary effort is normal. No respiratory distress.     Breath sounds: Normal breath sounds. No wheezing, rhonchi or rales.  Abdominal:     General: Abdomen is flat. Bowel sounds are normal. There is no distension.     Palpations: Abdomen  is soft. There is no mass.     Tenderness: There is abdominal tenderness in the right upper quadrant and epigastric area. There is no guarding or rebound. Negative signs include Murphy's sign.  Musculoskeletal:     Cervical back: Neck supple.  Skin:    General: Skin is warm and dry.  Neurological:     Mental Status: She is alert and oriented to person, place, and time.  Psychiatric:        Mood and Affect: Mood normal.        Speech: Speech normal.        Behavior: Behavior normal. Behavior is cooperative.    ED Results / Procedures / Treatments   Labs (all labs ordered are listed, but only abnormal results are displayed) Labs Reviewed  COMPREHENSIVE METABOLIC PANEL - Abnormal; Notable for the following components:      Result Value   Glucose, Bld 122 (*)    Total Protein 8.3 (*)    All other components within normal limits  URINALYSIS, ROUTINE W REFLEX MICROSCOPIC - Abnormal; Notable for the following components:   Ketones, ur 80 (*)    All other components within normal limits  LIPASE, BLOOD  CBC  I-STAT BETA HCG BLOOD, ED ([facility], [facility], [facility] ONLY)  TROPONIN I (HIGH SENSITIVITY)    EKG None  Radiology US Abdomen Limited RUQ (LIVER/GB)  Result Date: 12/20/2020 CLINICAL DATA:  Abdominal pain EXAM: ULTRASOUND ABDOMEN LIMITED RIGHT UPPER QUADRANT COMPARISON:  None. FINDINGS: Gallbladder: No gallstones or wall thickening visualized. No sonographic Murphy sign noted by sonographer. Common bile duct: Diameter: 3.8 mm Liver: No focal lesion identified. Within normal  limits in parenchymal echogenicity. Portal vein is patent on color Doppler imaging with normal direction of blood flow towards the liver. Other: None. IMPRESSION: Unremarkable right upper quadrant ultrasound. Electronically Signed   By: Macy Mis M.D.   On: 12/20/2020 08:55    Procedures Procedures   Medications Ordered in ED Medications  ondansetron (ZOFRAN-ODT) disintegrating tablet 4 mg (4 mg Oral Given 12/20/20 0747)  morphine 4 MG/ML injection 4 mg (4 mg Intravenous Given 12/20/20 0823)  metoCLOPramide (REGLAN) injection 10 mg (10 mg Intravenous Given 12/20/20 0823)  alum & mag hydroxide-simeth (MAALOX/MYLANTA) 200-200-20 MG/5ML suspension 30 mL (30 mLs Oral Given 12/20/20 [id number])    And  lidocaine (XYLOCAINE) 2 % viscous mouth solution 15 mL (15 mLs Oral Given 12/20/20 0822)  sodium chloride 0.9 % bolus 1,000 mL (0 mLs Intravenous Stopped 12/20/20 0933)    ED Course  I have reviewed the triage vital signs and the nursing notes.  Pertinent labs & imaging results that were available during my care of the patient were reviewed by me and considered in my medical decision making (see chart for details).    MDM Rules/Calculators/A&P                          This is a 35 y.o. female who presents to the ED with epigastric pain that awoke her from sleep and has been gradually worsening.  Vitals: She is afebrile.  Vital stable.  Patient is tearful and appears to be in distress due to acute pain.. Exam with right upper quadrant and epigastric tenderness to palpation.  No guarding or rigidity.  Patient actively vomiting on my assessment.  Concern for Cholelithiasis, cholecystitis, cholangitis, pancreatitis, acute hepatitis, gastric or duodenal ulcer.  Lower concern for bowel obstruction due to patient's age and no pertinent history.  Early appendicitis is also in differential.  This could also be gastroenteritis presentation due to foodborne illness.  I personally reviewed all laboratory work  and imaging.  - CBC normal - CMP: No transaminitis, hyperbilirubinemia, electrolyte abnormality, kidney abnormality, or other metabolic abnormality - Lipase: Negative - UA: Positive for ketones, likely due to decreased PO. No acidosis, hyperkalemia, etc.  - Troponin: Negative - Pregnancy Negative - RUQ Ultrasound: normal with no evidence of cholelithiasis or cholecystitis - EKG: NSR with borderline T wave abnormalities  Interventions: - 0747: Zofran 4 mg - 2080:  4 mg Morphine, 10 mg Reglan, Maalox/Mylanta/Lidocaine cocktail, 1 L IVF.  On reassessment, patient symptoms have been alleviated.  Work-up was entirely unrevealing with normal labs and imaging.  After reviewing previous history, current presentation, labs and imaging, I suspect symptoms could be related to a foodborne illness or gastritis. Also could be due to gastric or duodenal ulcer. Low suspicion for appendicitis.  Since symptoms have resolved after medications, do not feel I need CT scan at this time. Plan to discharge home with antiemetics. Return precautions given if symptoms return or worsen. She may need CT imaging at this time. Provided patient with GI referral if symptoms return for eval for ulcer.   Portions of this note were generated with Scientist, clinical (histocompatibility and immunogenetics). Dictation errors may occur despite best attempts at proofreading.  Final Clinical Impression(s) / ED Diagnoses Final diagnoses:  Abdominal pain  Epigastric pain    Rx / DC Orders ED Discharge Orders          Ordered    ondansetron (ZOFRAN) 4 MG tablet  Every 6 hours        12/20/20 1054             Bazil Dhanani, Finis Bud, PA-C 12/20/20 1132    Horton, Clabe Seal, DO 12/20/20 1531

## 2020-12-20 NOTE — Discharge Instructions (Addendum)
You have been seen in the emergency department for abdominal pain and nausea.  We treated your symptoms, and your pain and nausea have been alleviated.  I will send you home with a few days of an antinausea medication.  Please return to the emergency department if your symptoms return or worsen.  I have also provided you with contact information for a GI specialist to be evaluated for a gastric ulcer. If you do not continue to have symptoms, you do not need to follow up with them.

## 2020-12-20 NOTE — ED Triage Notes (Signed)
Per pt, states epigastric pain that woke her up from her sleep-states she took antacids, malox with no relief

## 2020-12-24 ENCOUNTER — Other Ambulatory Visit: Payer: Self-pay | Admitting: Nurse Practitioner

## 2020-12-24 DIAGNOSIS — Z6838 Body mass index (BMI) 38.0-38.9, adult: Secondary | ICD-10-CM

## 2020-12-26 ENCOUNTER — Other Ambulatory Visit (INDEPENDENT_AMBULATORY_CARE_PROVIDER_SITE_OTHER): Payer: No Typology Code available for payment source

## 2020-12-26 ENCOUNTER — Other Ambulatory Visit: Payer: Self-pay

## 2020-12-26 DIAGNOSIS — Z6838 Body mass index (BMI) 38.0-38.9, adult: Secondary | ICD-10-CM | POA: Diagnosis not present

## 2020-12-26 LAB — HEPATIC FUNCTION PANEL
ALT: 31 U/L (ref 0–35)
AST: 18 U/L (ref 0–37)
Albumin: 4.3 g/dL (ref 3.5–5.2)
Alkaline Phosphatase: 63 U/L (ref 39–117)
Bilirubin, Direct: 0.1 mg/dL (ref 0.0–0.3)
Total Bilirubin: 0.4 mg/dL (ref 0.2–1.2)
Total Protein: 7.6 g/dL (ref 6.0–8.3)

## 2020-12-26 LAB — LIPASE: Lipase: 25 U/L (ref 11.0–59.0)

## 2021-01-19 NOTE — Telephone Encounter (Signed)
Chart supports Rx Last seen 11/2020 Next OV 02/03/21

## 2021-01-30 ENCOUNTER — Telehealth: Payer: Self-pay | Admitting: Nurse Practitioner

## 2021-02-01 NOTE — Telephone Encounter (Signed)
Pt called back and said that she had contacted the pharmacy again and they said they were able to get it filled and she wasn't sure what the issue was from before. She wasn't sure if the next refill can be sent in or not

## 2021-02-01 NOTE — Telephone Encounter (Signed)
Pt called about this and said the pharmacy told her they are waiting on [place] to refill, please advise

## 2021-02-02 ENCOUNTER — Other Ambulatory Visit: Payer: Self-pay

## 2021-02-03 ENCOUNTER — Ambulatory Visit (INDEPENDENT_AMBULATORY_CARE_PROVIDER_SITE_OTHER): Payer: No Typology Code available for payment source | Admitting: Nurse Practitioner

## 2021-02-03 ENCOUNTER — Encounter: Payer: Self-pay | Admitting: Nurse Practitioner

## 2021-02-03 VITALS — BP 116/70 | HR 80 | Temp 96.4°F | Ht 64.5 in | Wt 194.0 lb

## 2021-02-03 DIAGNOSIS — Z6832 Body mass index (BMI) 32.0-32.9, adult: Secondary | ICD-10-CM

## 2021-02-03 DIAGNOSIS — F322 Major depressive disorder, single episode, severe without psychotic features: Secondary | ICD-10-CM | POA: Diagnosis not present

## 2021-02-03 DIAGNOSIS — F339 Major depressive disorder, recurrent, unspecified: Secondary | ICD-10-CM

## 2021-02-03 DIAGNOSIS — E6609 Other obesity due to excess calories: Secondary | ICD-10-CM

## 2021-02-03 DIAGNOSIS — F411 Generalized anxiety disorder: Secondary | ICD-10-CM | POA: Diagnosis not present

## 2021-02-03 DIAGNOSIS — K219 Gastro-esophageal reflux disease without esophagitis: Secondary | ICD-10-CM | POA: Diagnosis not present

## 2021-02-03 DIAGNOSIS — E782 Mixed hyperlipidemia: Secondary | ICD-10-CM | POA: Diagnosis not present

## 2021-02-03 DIAGNOSIS — R7303 Prediabetes: Secondary | ICD-10-CM | POA: Diagnosis not present

## 2021-02-03 DIAGNOSIS — Z0001 Encounter for general adult medical examination with abnormal findings: Secondary | ICD-10-CM

## 2021-02-03 LAB — LIPID PANEL
Cholesterol: 187 mg/dL (ref 0–200)
HDL: 47.8 mg/dL (ref >39.00)
LDL Cholesterol: 128 mg/dL — ABNORMAL HIGH (ref 0–99)
NonHDL: 139.17
Total CHOL/HDL Ratio: 4
Triglycerides: 54 mg/dL (ref 0.0–149.0)
VLDL: 10.8 mg/dL (ref 0.0–40.0)

## 2021-02-03 LAB — TSH: TSH: 2.07 u[IU]/mL (ref 0.35–5.50)

## 2021-02-03 LAB — HEMOGLOBIN A1C: Hgb A1c MFr Bld: 5.4 % (ref 4.6–6.5)

## 2021-02-03 MED ORDER — SAXENDA 18 MG/3ML ~~LOC~~ SOPN: PEN_INJECTOR | SUBCUTANEOUS | 1 refills | Status: DC

## 2021-02-03 MED ORDER — BUPROPION HCL 100 MG PO TABS: 100.0000 mg | ORAL_TABLET | Freq: Two times a day (BID) | ORAL | 3 refills | Status: DC

## 2021-02-03 MED ORDER — OMEPRAZOLE 20 MG PO CPDR: 20.0000 mg | DELAYED_RELEASE_CAPSULE | Freq: Two times a day (BID) | ORAL | 0 refills | Status: DC

## 2021-02-03 NOTE — Patient Instructions (Signed)
Increase saxenda to 2.19m daily Continue heart healthy diet and exercise regimen  Go to lab for blood draw  Sign medical release to get records from [id number] Preventive care refers to lifestyle choices and visits with your health care provider that can promote health and wellness. Preventive care visits are also called wellness exams. What can I expect for my preventive care visit? Counseling During your preventive care visit, your health care provider may ask about your: Medical history, including: Past medical problems. Family medical history. Pregnancy history. Current health, including: Menstrual cycle. Method of birth control. Emotional well-being. Home life and relationship well-being. Sexual activity and sexual health. Lifestyle, including: Alcohol, nicotine or tobacco, and drug use. Access to firearms. Diet, exercise, and sleep habits. Work and work [employer] Sun[employer] use. Safety issues such as seatbelt and bike helmet use. Physical exam Your health care provider may check your: Height and weight. These may be used to calculate your BMI (body mass index). BMI is a measurement that tells if you are at a healthy weight. Waist circumference. This measures the distance around your waistline. This measurement also tells if you are at a healthy weight and may help predict your risk of certain diseases, such as type 2 diabetes and high blood pressure. Heart rate and blood pressure. Body temperature. Skin for abnormal spots. What immunizations do I need? Vaccines are usually given at various ages, according to a schedule. Your health care provider will recommend vaccines for you based on your age, medical history, and lifestyle or other factors, such as travel or where you work. What tests do I need? Screening Your health care provider may recommend screening tests for certain conditions. This may include: Pelvic exam and Pap  test. Lipid and cholesterol levels. Diabetes screening. This is done by checking your blood sugar (glucose) after you have not eaten for a while (fasting). Hepatitis B test. Hepatitis C test. HIV (human immunodeficiency virus) test. STI (sexually transmitted infection) testing, if you are at risk. BRCA-related cancer screening. This may be done if you have a family history of breast, ovarian, tubal, or peritoneal cancers. Talk with your health care provider about your test results, treatment options, and if necessary, the need for more tests. Follow these instructions at home: Eating and drinking  Eat a healthy diet that includes fresh fruits and vegetables, whole grains, lean protein, and low-fat dairy products. Take vitamin and mineral supplements as recommended by your health care provider. Do not drink alcohol if: Your health care provider tells you not to drink. You are pregnant, may be pregnant, or are planning to become pregnant. If you drink alcohol: Limit how much you have to 0-1 drink a day. Know how much alcohol is in your drink. In the [place] one drink equals one 12 oz bottle of beer (355 mL), one 5 oz glass of wine (148 mL), or one 1 oz glass of hard liquor (44 mL). Lifestyle Brush your teeth every morning and night with fluoride toothpaste. Floss one time each day. Exercise for at least 30 minutes 5 or more days each week. Do not use any products that contain nicotine or tobacco. These products include cigarettes, chewing tobacco, and vaping devices, such as e-cigarettes. If you need help quitting, ask your health care provider. Do not use drugs. If you are sexually active, practice safe sex. Use a condom or other form of protection to prevent STIs. If you do not wish to become pregnant, use a  form of birth control. If you plan to become pregnant, see your health care provider for a prepregnancy visit. Find healthy ways to manage stress, such as: Meditation, yoga, or  listening to music. Journaling. Talking to a trusted person. Spending time with friends and family. Minimize exposure to UV radiation to reduce your risk of skin cancer. Safety Always wear your seat belt while driving or riding in a vehicle. Do not drive: If you have been drinking alcohol. Do not ride with someone who has been drinking. If you have been using any mind-altering substances or drugs. While texting. When you are tired or distracted. Wear a helmet and other protective equipment during sports activities. If you have firearms in your house, make sure you follow all gun safety procedures. Seek help if you have been physically or sexually abused. What's next? Go to your health care provider once a year for an annual wellness visit. Ask your health care provider how often you should have your eyes and teeth checked. Stay up to date on all vaccines. This information is not intended to replace advice given to you by your health care provider. Make sure you discuss any questions you have with your health care provider. Document Revised: 08/03/2020 Document Reviewed: 08/03/2020 Elsevier Patient Education  [place].

## 2021-02-03 NOTE — Assessment & Plan Note (Signed)
Repeat lipid panel ?

## 2021-02-03 NOTE — Assessment & Plan Note (Signed)
Lost 30pounds since 06/2020 Current use of saxenda 1.8mg  daily with no adverse side effects Exercise: cardio and weight training 3x/week, 45-67mins each Diet: weight watchers. Wt Readings from Last 3 Encounters:  02/03/21 194 lb (88 kg)  11/18/20 221 lb 9.6 oz (100.5 kg)  06/22/20 224 lb 12.8 oz (102 kg)   Increase saxenda dose to 2.4mg  daily F/up in 63months

## 2021-02-03 NOTE — Assessment & Plan Note (Addendum)
Intermittent Epigastric pain, no improvement with pepcid AC. Worse if she eats late and skips breakfast No melena or hematochezia or dysphagia  Advised to avoid skipping meals, minimize caffeine consumption Start omeprazole 20mg  BID x 2week

## 2021-02-03 NOTE — Assessment & Plan Note (Signed)
Stable mood with wellbutrin and zoloft Maintain med dose

## 2021-02-03 NOTE — Assessment & Plan Note (Signed)
Repeat hgbA1c 

## 2021-02-03 NOTE — Assessment & Plan Note (Signed)
Stable mood with wellbutrin and zoloft

## 2021-02-03 NOTE — Progress Notes (Signed)
Subjective:    Patient ID: Amy [name], female    DOB: 1985-05-06, 35 y.o.   MRN: [id number]  Patient presents today for CPE and eval of chronic conditions  HPI Gastroesophageal reflux disease without esophagitis Intermittent Epigastric pain, no improvement with pepcid AC. Worse if she eats late and skips breakfast No melena or hematochezia or dysphagia  Advised to avoid skipping meals, minimize caffeine consumption Start omeprazole 20mg  BID x 2week  Prediabetes Repeat hgbA1c  Obesity Lost 30pounds since 06/2020 Current use of saxenda 1.8mg  daily with no adverse side effects Exercise: cardio and weight training 3x/week, 45-51mins each Diet: weight watchers. Wt Readings from Last 3 Encounters:  02/03/21 194 lb (88 kg)  11/18/20 221 lb 9.6 oz (100.5 kg)  06/22/20 224 lb 12.8 oz (102 kg)   Increase saxenda dose to 2.4mg  daily F/up in 64months  GAD (generalized anxiety disorder) Stable mood with wellbutrin and zoloft Maintain med dose  Mixed hyperlipidemia Repeat lipid panel  Depression, recurrent (HCC) Stable mood with wellbutrin and zoloft  Vision:up to date Dental:up to date   Sexual History (orientation,birth control, marital status, STD):pelvic exam completed by GYN, last PAP 08/2020 per patient. Report requested, requesting for breast exam only today. No need for STD screen today  Depression/Suicide: Depression screen [facility] 2/9 02/03/2021 11/18/2020 06/22/2020 01/27/2020 09/28/2019 07/02/2019 07/02/2019  Decreased Interest 0 1 0 0 1 3 1   Down, Depressed, Hopeless 0 1 1 0 2 3 1   PHQ - 2 Score 0 2 1 0 3 6 2   Altered sleeping - 1 0 2 3 1  -  Tired, decreased energy - 1 1 1 1 3  -  Change in appetite - 0 0 0 0 3 -  Feeling bad or failure about yourself  - 0 1 0 2 3 -  Trouble concentrating - 0 0 0 1 2 -  Moving slowly or fidgety/restless - 0 1 0 2 3 -  Suicidal thoughts - 0 0 0 0 0 -  PHQ-9 Score - 4 4 3 12 21  -  Difficult doing work/chores - Somewhat difficult  Somewhat difficult Not difficult at all - Very difficult -   GAD 7 : Generalized Anxiety Score 02/03/2021 11/18/2020 06/22/2020 01/27/2020  Nervous, Anxious, on Edge 0 1 1 1   Control/stop worrying 0 1 1 1   Worry too much - different things 1 2 1 2   Trouble relaxing 0 1 0 0  Restless 0 0 0 0  Easily annoyed or irritable 0 0 0 0  Afraid - awful might happen 0 0 0 0  Total GAD 7 Score 1 5 3 4   Anxiety Difficulty Not difficult at all Somewhat difficult Not difficult at all Not difficult at all   Immunizations: (TDAP, Hep C screen, Pneumovax, Influenza, zoster)  Health Maintenance  Topic Date Due   Pap Smear  02/09/2020   COVID-19 Vaccine (4 - Booster for Pfizer series) 02/09/2020   Hepatitis C Screening: USPSTF Recommendation to screen - Ages 18-79 yo.  02/03/2022*   Tetanus Vaccine  04/21/2028   Flu Shot  Completed   HIV Screening  Completed   Pneumococcal Vaccination  Aged Out   HPV Vaccine  Aged Out  *Topic was postponed. The date shown is not the original due date.   Fall Risk: Fall Risk  08/02/2020 06/22/2020 01/27/2020 07/02/2019 12/05/2018 02/28/2017 11/28/2016  Falls in the past year? 0 0 0 0 0 Yes No  Number falls in past yr: 0 0 0 0 - 1 -  Injury with Fall? 0 0 0 0 - Yes -  Comment - - - - - knee injury -  Risk for fall due to : - No Fall Risks - - - - -  Follow up - Falls evaluation completed Falls evaluation completed - - - -   Medications and allergies reviewed with patient and updated if appropriate.  Patient Active Problem List   Diagnosis Date Noted   Post-inflammatory hyperpigmentation 11/18/2020   Gastroesophageal reflux disease without esophagitis 06/22/2020   Genital herpes simplex 06/22/2020   Acanthosis nigricans 01/27/2020   Obesity 01/27/2020   GAD (generalized anxiety disorder) 11/06/2019   Mixed hyperlipidemia 12/07/2018   Vitamin D deficiency 03/04/2017   Prediabetes 05/31/2016   Insomnia 11/04/2015   Depression, recurrent (HCC) 11/04/2015    Current  Outpatient Medications on File Prior to Visit  Medication Sig Dispense Refill   CLINDAMYCIN PHOS & CLEANSER EX Apply topically.     Etonogestrel (NEXPLANON [place]) Inject into the skin. 3 years     Insulin Pen Needle (PEN NEEDLES) 31G X 6 MM MISC 1 application by Does not apply route at bedtime. For saxenda pen 30 each 5   ondansetron (ZOFRAN) 4 MG tablet Take 1 tablet (4 mg total) by mouth every 6 (six) hours. 30 tablet 0   sertraline (ZOLOFT) 25 MG tablet Take 1 tablet (25 mg total) by mouth daily. 90 tablet 3   Tretinoin 0.05 % LOTN Apply topically.     valACYclovir (VALTREX) 500 MG tablet TAKE 1 TABLET BY MOUTH ONCE DAILY 90 tablet 1   No current facility-administered medications on file prior to visit.    Past Medical History:  Diagnosis Date   Anxiety    with flying only   GERD (gastroesophageal reflux disease)    Herpes    Vaginal Pap smear, abnormal     Past Surgical History:  Procedure Laterality Date   CERVICAL BIOPSY  W/ LOOP ELECTRODE EXCISION     CESAREAN SECTION N/A 07/20/2018   Procedure: CESAREAN SECTION;  Surgeon: Essie Hart, MD;  Location: [facility] LD ORS;  Service: Obstetrics;  Laterality: N/A;   WISDOM TOOTH EXTRACTION      Social History   Socioeconomic History   Marital status: Married    Spouse name: Not on file   Number of children: Not on file   Years of education: Not on file   Highest education level: Not on file  Occupational History   Occupation: Teacher, adult education: [employer]    Comment: GI/endo at [employer]  Tobacco Use   Smoking status: Never   Smokeless tobacco: Never  Vaping Use   Vaping Use: Never used  Substance and Sexual Activity   Alcohol use: Not Currently    Comment: social   Drug use: No   Sexual activity: Yes    Birth control/protection: Condom  Other Topics Concern   Not on file  Social History Narrative   Not on file   Social Determinants of Health   Financial Resource Strain: Not on file  Food Insecurity: Not on file   Transportation Needs: Not on file  Physical Activity: Not on file  Stress: Not on file  Social Connections: Not on file    Family History  Problem Relation Age of Onset   Hypertension Mother    Hyperlipidemia Mother    Hyperlipidemia Father    Alcohol abuse Father    Cirrhosis Father    Hypertension Maternal Aunt    Heart disease Paternal  Grandmother         ROS  Objective:   Vitals:   02/03/21 0833  BP: 116/70  Pulse: 80  Temp: (!) 96.4 F (35.8 C)  SpO2: 98%    Body mass index is 32.79 kg/m.   Physical Examination:  Physical Exam  ASSESSMENT and PLAN: This visit occurred during the SARS-CoV-2 public health emergency.  Safety protocols were in place, including screening questions prior to the visit, additional usage of staff PPE, and extensive cleaning of exam room while observing appropriate contact time as indicated for disinfecting solutions.   Terris was seen today for annual exam.  Diagnoses and all orders for this visit:  Encounter for preventative adult health care exam with abnormal findings -     TSH  Gastroesophageal reflux disease without esophagitis -     omeprazole (PRILOSEC) 20 MG capsule; Take 1 capsule (20 mg total) by mouth 2 (two) times daily before a meal.  Prediabetes -     Hemoglobin A1c  GAD (generalized anxiety disorder)  Class 1 obesity due to excess calories with serious comorbidity and body mass index (BMI) of 32.0 to 32.9 in adult -     Liraglutide -Weight Management (SAXENDA) 18 MG/3ML SOPN; 2.4mg  daily  Mixed hyperlipidemia -     Lipid panel  Depression, recurrent (HCC)  Depression, major, single episode, severe (HCC) -     buPROPion (WELLBUTRIN) 100 MG tablet; Take 1 tablet (100 mg total) by mouth 2 (two) times daily.       Problem List Items Addressed This Visit       Digestive   Gastroesophageal reflux disease without esophagitis    Intermittent Epigastric pain, no improvement with pepcid AC. Worse if  she eats late and skips breakfast No melena or hematochezia or dysphagia  Advised to avoid skipping meals, minimize caffeine consumption Start omeprazole 20mg  BID x 2week      Relevant Medications   omeprazole (PRILOSEC) 20 MG capsule     Other   Depression, recurrent (HCC)    Stable mood with wellbutrin and zoloft      Relevant Medications   buPROPion (WELLBUTRIN) 100 MG tablet   GAD (generalized anxiety disorder)    Stable mood with wellbutrin and zoloft Maintain med dose      Relevant Medications   buPROPion (WELLBUTRIN) 100 MG tablet   Mixed hyperlipidemia    Repeat lipid panel      Relevant Orders   Lipid panel   Obesity    Lost 30pounds since 06/2020 Current use of saxenda 1.8mg  daily with no adverse side effects Exercise: cardio and weight training 3x/week, 45-72mins each Diet: weight watchers. Wt Readings from Last 3 Encounters:  02/03/21 194 lb (88 kg)  11/18/20 221 lb 9.6 oz (100.5 kg)  06/22/20 224 lb 12.8 oz (102 kg)   Increase saxenda dose to 2.4mg  daily F/up in 56months      Relevant Medications   Liraglutide -Weight Management (SAXENDA) 18 MG/3ML SOPN   Prediabetes    Repeat hgbA1c      Relevant Orders   Hemoglobin A1c   Other Visit Diagnoses     Encounter for preventative adult health care exam with abnormal findings    -  Primary   Relevant Orders   TSH   Depression, major, single episode, severe (HCC)       Relevant Medications   buPROPion (WELLBUTRIN) 100 MG tablet       Follow up: Return in about 3 months (around 05/04/2021) for  weight management and prediabetes.  Alysia Penna, NP

## 2021-02-18 ENCOUNTER — Emergency Department (HOSPITAL_COMMUNITY): Payer: No Typology Code available for payment source

## 2021-02-18 ENCOUNTER — Other Ambulatory Visit: Payer: Self-pay

## 2021-02-18 ENCOUNTER — Emergency Department (HOSPITAL_COMMUNITY)
Admission: EM | Admit: 2021-02-18 | Discharge: 2021-02-18 | Disposition: A | Discharge status: Home / Self Care | Payer: No Typology Code available for payment source | Attending: Emergency Medicine | Admitting: Emergency Medicine

## 2021-02-18 DIAGNOSIS — R1010 Upper abdominal pain, unspecified: Secondary | ICD-10-CM | POA: Insufficient documentation

## 2021-02-18 DIAGNOSIS — D72829 Elevated white blood cell count, unspecified: Secondary | ICD-10-CM | POA: Diagnosis not present

## 2021-02-18 DIAGNOSIS — R319 Hematuria, unspecified: Secondary | ICD-10-CM | POA: Insufficient documentation

## 2021-02-18 DIAGNOSIS — K219 Gastro-esophageal reflux disease without esophagitis: Secondary | ICD-10-CM | POA: Insufficient documentation

## 2021-02-18 LAB — CBC WITH DIFFERENTIAL/PLATELET
Abs Immature Granulocytes: 0.11 10*3/uL — ABNORMAL HIGH (ref 0.00–0.07)
Basophils Absolute: 0 10*3/uL (ref 0.0–0.1)
Basophils Relative: 0 %
Eosinophils Absolute: 0 10*3/uL (ref 0.0–0.5)
Eosinophils Relative: 0 %
HCT: 41.1 % (ref 36.0–46.0)
Hemoglobin: 13.3 g/dL (ref 12.0–15.0)
Immature Granulocytes: 1 %
Lymphocytes Relative: 22 %
Lymphs Abs: 2.2 10*3/uL (ref 0.7–4.0)
MCH: 29.4 pg (ref 26.0–34.0)
MCHC: 32.4 g/dL (ref 30.0–36.0)
MCV: 90.9 fL (ref 80.0–100.0)
Monocytes Absolute: 0.6 10*3/uL (ref 0.1–1.0)
Monocytes Relative: 6 %
Neutro Abs: 7.1 10*3/uL (ref 1.7–7.7)
Neutrophils Relative %: 71 %
Platelets: 342 10*3/uL (ref 150–400)
RBC: 4.52 MIL/uL (ref 3.87–5.11)
RDW: 14.3 % (ref 11.5–15.5)
WBC: 10 10*3/uL (ref 4.0–10.5)
nRBC: 0 % (ref 0.0–0.2)

## 2021-02-18 LAB — COMPREHENSIVE METABOLIC PANEL
ALT: 49 U/L — ABNORMAL HIGH (ref 0–44)
AST: 47 U/L — ABNORMAL HIGH (ref 15–41)
Albumin: 4.1 g/dL (ref 3.5–5.0)
Alkaline Phosphatase: 62 U/L (ref 38–126)
Anion gap: 10 (ref 5–15)
BUN: 15 mg/dL (ref 6–20)
CO2: 23 mmol/L (ref 22–32)
Calcium: 9.1 mg/dL (ref 8.9–10.3)
Chloride: 102 mmol/L (ref 98–111)
Creatinine, Ser: 0.95 mg/dL (ref 0.44–1.00)
GFR, Estimated: >60 mL/min (ref >60)
Glucose, Bld: 167 mg/dL — ABNORMAL HIGH (ref 70–99)
Potassium: 3.8 mmol/L (ref 3.5–5.1)
Sodium: 135 mmol/L (ref 135–145)
Total Bilirubin: 0.7 mg/dL (ref 0.3–1.2)
Total Protein: 8.4 g/dL — ABNORMAL HIGH (ref 6.5–8.1)

## 2021-02-18 LAB — URINALYSIS, ROUTINE W REFLEX MICROSCOPIC
Bilirubin Urine: NEGATIVE
Glucose, UA: NEGATIVE mg/dL
Ketones, ur: 20 mg/dL — AB
Nitrite: NEGATIVE
Protein, ur: 30 mg/dL — AB
RBC / HPF: >50 RBC/hpf — ABNORMAL HIGH (ref 0–5)
Specific Gravity, Urine: 1.024 (ref 1.005–1.030)
pH: 7 (ref 5.0–8.0)

## 2021-02-18 LAB — LIPASE, BLOOD: Lipase: 34 U/L (ref 11–51)

## 2021-02-18 LAB — HCG, QUANTITATIVE, PREGNANCY: hCG, Beta Chain, Quant, S: <1 m[IU]/mL (ref <5)

## 2021-02-18 MED ORDER — HYDROMORPHONE HCL 1 MG/ML IJ SOLN
0.5000 mg | Freq: Once | INTRAMUSCULAR | Status: AC
  Administered 2021-02-18: 0.5 mg via INTRAVENOUS
  Filled 2021-02-18: qty 1

## 2021-02-18 MED ORDER — LIDOCAINE VISCOUS HCL 2 % MT SOLN
15.0000 mL | Freq: Once | OROMUCOSAL | Status: AC
  Administered 2021-02-18: 15 mL via ORAL
  Filled 2021-02-18: qty 15

## 2021-02-18 MED ORDER — LACTATED RINGERS IV SOLN: INTRAVENOUS | Status: DC

## 2021-02-18 MED ORDER — ALUM & MAG HYDROXIDE-SIMETH 200-200-20 MG/5ML PO SUSP
30.0000 mL | Freq: Once | ORAL | Status: AC
  Administered 2021-02-18: 30 mL via ORAL
  Filled 2021-02-18: qty 30

## 2021-02-18 MED ORDER — FAMOTIDINE 20 MG PO TABS
40.0000 mg | ORAL_TABLET | Freq: Once | ORAL | Status: AC
  Administered 2021-02-18: 40 mg via ORAL
  Filled 2021-02-18: qty 2

## 2021-02-18 MED ORDER — IOHEXOL 350 MG/ML SOLN
80.0000 mL | Freq: Once | INTRAVENOUS | Status: AC | PRN
  Administered 2021-02-18: 80 mL via INTRAVENOUS

## 2021-02-18 MED ORDER — SUCRALFATE 1 G PO TABS
1.0000 g | ORAL_TABLET | Freq: Once | ORAL | Status: AC
  Administered 2021-02-18: 1 g via ORAL
  Filled 2021-02-18: qty 1

## 2021-02-18 MED ORDER — METOCLOPRAMIDE HCL 5 MG/ML IJ SOLN
5.0000 mg | Freq: Once | INTRAMUSCULAR | Status: AC
  Administered 2021-02-18: 5 mg via INTRAVENOUS
  Filled 2021-02-18: qty 2

## 2021-02-18 NOTE — ED Triage Notes (Signed)
Patient reports abd pain, epigastric rated 9/10, starting at 1:30am today.

## 2021-02-18 NOTE — ED Provider Notes (Signed)
[facility]-EMERGENCY DEPT Provider Note   CSN: [id number] Arrival date & time: 02/18/21  0516     History Chief Complaint  Patient presents with   Abdominal Pain    Amy [name] is a 35 y.o. female.  35 year old female presents with upper abdominal pain which began overnight.  Patient states she had a heavy meal yesterday evening.  History of same and has been diagnosed with reflux.  Had a gallbladder ultrasound done about 2 months ago which was negative.  Has been seen by GI and prescribed PPI.  She has not had any emesis, fever.  Pain is epigastric radiating to her back.  No cardiac symptoms      Past Medical History:  Diagnosis Date   Anxiety    with flying only   GERD (gastroesophageal reflux disease)    Herpes    Vaginal Pap smear, abnormal     Patient Active Problem List   Diagnosis Date Noted   Post-inflammatory hyperpigmentation 11/18/2020   Gastroesophageal reflux disease without esophagitis 06/22/2020   Genital herpes simplex 06/22/2020   Acanthosis nigricans 01/27/2020   Obesity 01/27/2020   GAD (generalized anxiety disorder) 11/06/2019   Mixed hyperlipidemia 12/07/2018   Vitamin D deficiency 03/04/2017   Prediabetes 05/31/2016   Insomnia 11/04/2015   Depression, recurrent (HCC) 11/04/2015    Past Surgical History:  Procedure Laterality Date   CERVICAL BIOPSY  W/ LOOP ELECTRODE EXCISION     CESAREAN SECTION N/A 07/20/2018   Procedure: CESAREAN SECTION;  Surgeon: Essie Hart, MD;  Location: [facility] LD ORS;  Service: Obstetrics;  Laterality: N/A;   WISDOM TOOTH EXTRACTION       OB History     Gravida  1   Para  1   Term  1   Preterm      AB      Living  1      SAB      IAB      Ectopic      Multiple  0   Live Births  1           Family History  Problem Relation Age of Onset   Hypertension Mother    Hyperlipidemia Mother    Hyperlipidemia Father    Alcohol abuse Father    Cirrhosis Father     Hypertension Maternal Aunt    Heart disease Paternal Grandmother     Social History   Tobacco Use   Smoking status: Never   Smokeless tobacco: Never  Vaping Use   Vaping Use: Never used  Substance Use Topics   Alcohol use: Not Currently    Comment: social   Drug use: No    Home Medications Prior to Admission medications   Medication Sig Start Date End Date Taking? Authorizing Provider  buPROPion (WELLBUTRIN) 100 MG tablet Take 1 tablet (100 mg total) by mouth 2 (two) times daily. 02/03/21   Nche, Bonna Gains, NP  CLINDAMYCIN PHOS & CLEANSER EX Apply topically.    [provider]  Etonogestrel (NEXPLANON [place]) Inject into the skin. 3 years    [provider]  Insulin Pen Needle (PEN NEEDLES) 31G X 6 MM MISC 1 application by Does not apply route at bedtime. For saxenda pen 11/18/20   Nche, Bonna Gains, NP  Liraglutide -Weight Management (SAXENDA) 18 MG/3ML SOPN 2.4mg  daily 02/03/21   Nche, Bonna Gains, NP  omeprazole (PRILOSEC) 20 MG capsule Take 1 capsule (20 mg total) by mouth 2 (two) times daily  before a meal. 02/03/21   Nche, Charlene Brooke, NP  ondansetron (ZOFRAN) 4 MG tablet Take 1 tablet (4 mg total) by mouth every 6 (six) hours. 12/20/20   Loeffler, Adora Fridge, PA-C  sertraline (ZOLOFT) 25 MG tablet Take 1 tablet (25 mg total) by mouth daily. 11/18/20   Nche, Charlene Brooke, NP  Tretinoin 0.05 % LOTN Apply topically.    [provider]  valACYclovir (VALTREX) 500 MG tablet TAKE 1 TABLET BY MOUTH ONCE DAILY 05/09/20 05/09/21  Nche, Charlene Brooke, NP    Allergies    Patient has no known allergies.  Review of Systems   Review of Systems  All other systems reviewed and are negative.  Physical Exam Updated Vital Signs BP 126/74 (BP Location: Left Arm)    Pulse 76    Temp 97.6 F (36.4 C) (Oral)    Resp 18    Ht 1.651 m (5\' 5" )    Wt 90.7 kg    SpO2 98%    BMI 33.28 kg/m   Physical Exam Vitals and nursing note reviewed.  Constitutional:       General: She is not in acute distress.    Appearance: Normal appearance. She is well-developed. She is not toxic-appearing.  HENT:     Head: Normocephalic and atraumatic.  Eyes:     General: Lids are normal.     Conjunctiva/sclera: Conjunctivae normal.     Pupils: Pupils are equal, round, and reactive to light.  Neck:     Thyroid: No thyroid mass.     Trachea: No tracheal deviation.  Cardiovascular:     Rate and Rhythm: Normal rate and regular rhythm.     Heart sounds: Normal heart sounds. No murmur heard.   No gallop.  Pulmonary:     Effort: Pulmonary effort is normal. No respiratory distress.     Breath sounds: Normal breath sounds. No stridor. No decreased breath sounds, wheezing, rhonchi or rales.  Abdominal:     General: There is no distension.     Palpations: Abdomen is soft.     Tenderness: There is abdominal tenderness in the right upper quadrant and epigastric area. There is no rebound.  Musculoskeletal:        General: No tenderness. Normal range of motion.     Cervical back: Normal range of motion and neck supple.  Skin:    General: Skin is warm and dry.     Findings: No abrasion or rash.  Neurological:     Mental Status: She is alert and oriented to person, place, and time. Mental status is at baseline.     GCS: GCS eye subscore is 4. GCS verbal subscore is 5. GCS motor subscore is 6.     Cranial Nerves: No cranial nerve deficit.     Sensory: No sensory deficit.     Motor: Motor function is intact.  Psychiatric:        Attention and Perception: Attention normal.        Speech: Speech normal.        Behavior: Behavior normal.    ED Results / Procedures / Treatments   Labs (all labs ordered are listed, but only abnormal results are displayed) Labs Reviewed  CBC WITH DIFFERENTIAL/PLATELET - Abnormal; Notable for the following components:      Result Value   Abs Immature Granulocytes 0.11 (*)    All other components within normal limits  COMPREHENSIVE METABOLIC  PANEL - Abnormal; Notable for the following components:   Glucose,  Bld 167 (*)    Total Protein 8.4 (*)    AST 47 (*)    ALT 49 (*)    All other components within normal limits  URINALYSIS, ROUTINE W REFLEX MICROSCOPIC - Abnormal; Notable for the following components:   Color, Urine AMBER (*)    APPearance HAZY (*)    Hgb urine dipstick MODERATE (*)    Ketones, ur 20 (*)    Protein, ur 30 (*)    Leukocytes,Ua TRACE (*)    RBC / HPF >50 (*)    Bacteria, UA RARE (*)    All other components within normal limits  LIPASE, BLOOD  HCG, QUANTITATIVE, PREGNANCY    EKG None  Radiology No results found.  Procedures Procedures   Medications Ordered in ED Medications  alum & mag hydroxide-simeth (MAALOX/MYLANTA) 200-200-20 MG/5ML suspension 30 mL (has no administration in time range)    And  lidocaine (XYLOCAINE) 2 % viscous mouth solution 15 mL (has no administration in time range)  sucralfate (CARAFATE) tablet 1 g (has no administration in time range)  famotidine (PEPCID) tablet 40 mg (has no administration in time range)    ED Course  I have reviewed the triage vital signs and the nursing notes.  Pertinent labs & imaging results that were available during my care of the patient were reviewed by me and considered in my medical decision making (see chart for details).    MDM Rules/Calculators/A&P                         CBC without evidence of leukocytosis.  Urinalysis shows some contamination as well as hematuria.  Patient continues to note pain despite getting meds here.  Therefore an abdominal CT was ordered.  Abdominal CT negative. Patient medicated for pain here and feels better.  Suspect patient has GERD.  Low suspicion for ACS or any other serious illness.  Will discharge home.    Final Clinical Impression(s) / ED Diagnoses Final diagnoses:  None    Rx / DC Orders ED Discharge Orders     None        Lacretia Leigh, MD 02/18/21 1149

## 2021-03-13 ENCOUNTER — Telehealth (HOSPITAL_COMMUNITY): Payer: No Typology Code available for payment source | Admitting: Psychiatry

## 2021-04-07 ENCOUNTER — Encounter: Payer: Self-pay | Admitting: Nurse Practitioner

## 2021-04-07 ENCOUNTER — Other Ambulatory Visit: Payer: Self-pay

## 2021-04-07 ENCOUNTER — Ambulatory Visit (INDEPENDENT_AMBULATORY_CARE_PROVIDER_SITE_OTHER): Payer: No Typology Code available for payment source | Admitting: Nurse Practitioner

## 2021-04-07 VITALS — BP 104/70 | HR 78 | Temp 97.6°F | Ht 64.5 in | Wt 178.2 lb

## 2021-04-07 DIAGNOSIS — E6609 Other obesity due to excess calories: Secondary | ICD-10-CM | POA: Diagnosis not present

## 2021-04-07 DIAGNOSIS — Z6830 Body mass index (BMI) 30.0-30.9, adult: Secondary | ICD-10-CM

## 2021-04-07 DIAGNOSIS — K2100 Gastro-esophageal reflux disease with esophagitis, without bleeding: Secondary | ICD-10-CM

## 2021-04-07 MED ORDER — FAMOTIDINE 20 MG PO TABS: 20.0000 mg | ORAL_TABLET | Freq: Every day | ORAL | 0 refills | Status: DC

## 2021-04-07 MED ORDER — SAXENDA 18 MG/3ML ~~LOC~~ SOPN: PEN_INJECTOR | SUBCUTANEOUS | 2 refills | Status: DC

## 2021-04-07 MED ORDER — PANTOPRAZOLE SODIUM 40 MG PO TBEC: 40.0000 mg | DELAYED_RELEASE_TABLET | Freq: Every day | ORAL | 0 refills | Status: DC

## 2021-04-07 NOTE — Patient Instructions (Signed)
Continue pantoprazole 40mg  in AM x 51months, then will need to switch to 20mg . Start famotidine 40mg  in PM x 2weeks, then 20mg  in PM till complete prescription. Maintain diet modifications. Schedule appt with GI if symptoms do not improvement in 2weeks. Maintain Saxenda dose.  Gastroesophageal Reflux Disease, Adult Gastroesophageal reflux (GER) happens when acid from the stomach flows up into the tube that connects the mouth and the stomach (esophagus). Normally, food travels down the esophagus and stays in the stomach to be digested. However, when a person has GER, food and stomach acid sometimes move back up into the esophagus. If this becomes a more serious problem, the person may be diagnosed with a disease called gastroesophageal reflux disease (GERD). GERD occurs when the reflux: Happens often. Causes frequent or severe symptoms. Causes problems such as damage to the esophagus. When stomach acid comes in contact with the esophagus, the acid may cause inflammation in the esophagus. Over time, GERD may create small holes (ulcers) in the lining of the esophagus. What are the causes? This condition is caused by a problem with the muscle between the esophagus and the stomach (lower esophageal sphincter, or LES). Normally, the LES muscle closes after food passes through the esophagus to the stomach. When the LES is weakened or abnormal, it does not close properly, and that allows food and stomach acid to go back up into the esophagus. The LES can be weakened by certain dietary substances, medicines, and medical conditions, including: Tobacco use. Pregnancy. Having a hiatal hernia. Alcohol use. Certain foods and beverages, such as coffee, chocolate, onions, and peppermint. What increases the risk? You are more likely to develop this condition if you: Have an increased body weight. Have a connective tissue disorder. Take NSAIDs, such as ibuprofen. What are the signs or symptoms? Symptoms of  this condition include: Heartburn. Difficult or painful swallowing and the feeling of having a lump in the throat. A bitter taste in the mouth. Bad breath and having a large amount of saliva. Having an upset or bloated stomach and belching. Chest pain. Different conditions can cause chest pain. Make sure you see your health care provider if you experience chest pain. Shortness of breath or wheezing. Ongoing (chronic) cough or a nighttime cough. Wearing away of tooth enamel. Weight loss. How is this diagnosed? This condition may be diagnosed based on a medical history and a physical exam. To determine if you have mild or severe GERD, your health care provider may also monitor how you respond to treatment. You may also have tests, including: A test to examine your stomach and esophagus with a small camera (endoscopy). A test that measures the acidity level in your esophagus. A test that measures how much pressure is on your esophagus. A barium swallow or modified barium swallow test to show the shape, size, and functioning of your esophagus. How is this treated? Treatment for this condition may vary depending on how severe your symptoms are. Your health care provider may recommend: Changes to your diet. Medicine. Surgery. The goal of treatment is to help relieve your symptoms and to prevent complications. Follow these instructions at home: Eating and drinking  Follow a diet as recommended by your health care provider. This may involve avoiding foods and drinks such as: Coffee and tea, with or without caffeine. Drinks that contain alcohol. Energy drinks and sports drinks. Carbonated drinks or sodas. Chocolate and cocoa. Peppermint and mint flavorings. Garlic and onions. Horseradish. Spicy and acidic foods, including peppers, chili powder, curry  powder, vinegar, hot sauces, and barbecue sauce. Citrus fruit juices and citrus fruits, such as oranges, lemons, and limes. Tomato-based  foods, such as red sauce, chili, salsa, and pizza with red sauce. Fried and fatty foods, such as donuts, french fries, potato chips, and high-fat dressings. High-fat meats, such as hot dogs and fatty cuts of red and white meats, such as rib eye steak, sausage, ham, and bacon. High-fat dairy items, such as whole milk, butter, and cream cheese. Eat small, frequent meals instead of large meals. Avoid drinking large amounts of liquid with your meals. Avoid eating meals during the 2-3 hours before bedtime. Avoid lying down right after you eat. Do not exercise right after you eat. Lifestyle  Do not use any products that contain nicotine or tobacco. These products include cigarettes, chewing tobacco, and vaping devices, such as e-cigarettes. If you need help quitting, ask your health care provider. Try to reduce your stress by using methods such as yoga or meditation. If you need help reducing stress, ask your health care provider. If you are overweight, reduce your weight to an amount that is healthy for you. Ask your health care provider for guidance about a safe weight loss goal. General instructions Pay attention to any changes in your symptoms. Take over-the-counter and prescription medicines only as told by your health care provider. Do not take aspirin, ibuprofen, or other NSAIDs unless your health care provider told you to take these medicines. Wear loose-fitting clothing. Do not wear anything tight around your waist that causes pressure on your abdomen. Raise (elevate) the head of your bed about 6 inches (15 cm). You can use a wedge to do this. Avoid bending over if this makes your symptoms worse. Keep all follow-up visits. This is important. Contact a health care provider if: You have: New symptoms. Unexplained weight loss. Difficulty swallowing or it hurts to swallow. Wheezing or a persistent cough. A hoarse voice. Your symptoms do not improve with treatment. Get help right away  if: You have sudden pain in your arms, neck, jaw, teeth, or back. You suddenly feel sweaty, dizzy, or light-headed. You have chest pain or shortness of breath. You vomit and the vomit is green, yellow, or black, or it looks like blood or coffee grounds. You faint. You have stool that is red, bloody, or black. You cannot swallow, drink, or eat. These symptoms may represent a serious problem that is an emergency. Do not wait to see if the symptoms will go away. Get medical help right away. Call your local emergency services (911 in the [place] Do not drive yourself to the hospital. Summary Gastroesophageal reflux happens when acid from the stomach flows up into the esophagus. GERD is a disease in which the reflux happens often, causes frequent or severe symptoms, or causes problems such as damage to the esophagus. Treatment for this condition may vary depending on how severe your symptoms are. Your health care provider may recommend diet and lifestyle changes, medicine, or surgery. Contact a health care provider if you have new or worsening symptoms. Take over-the-counter and prescription medicines only as told by your health care provider. Do not take aspirin, ibuprofen, or other NSAIDs unless your health care provider told you to do so. Keep all follow-up visits as told by your health care provider. This is important. This information is not intended to replace advice given to you by your health care provider. Make sure you discuss any questions you have with your health care provider. Document Revised:  08/17/2019 Document Reviewed: 08/17/2019 Elsevier Patient Education  2022 [employer].

## 2021-04-07 NOTE — Assessment & Plan Note (Signed)
Epigastric pain and heart burn, worse at HS Worsening with use of sexanda injection. Does not want to d/c med at this time due to successful weight loss. Has made dietary modifications: low fat meal, avoids eating after 8pm (no improvement) No improvement with omeprazole. Some improvement with pantoprazole 40mg  daily. She was evaluated at ED 02/18/2021. Reviewed lab results and CT ABD report. No acute finding. Last appt with [facility] physicians GI: 01/2021 per patient.  complete pantoprazole 40mg  in AM, then will need to switch to 20mg . Start famotidine 40mg  in PM x 2weeks, then 20mg  in PM till complete prescription. Maintain diet modifications. Schedule appt with GI if symptoms do not improvement in 2weeks.

## 2021-04-07 NOTE — Assessment & Plan Note (Signed)
Lowest adult weight: 160lbs. 22lbs weight loss in last 47months. Wt Readings from Last 3 Encounters:  04/07/21 178 lb 3.2 oz (80.8 kg)  02/18/21 200 lb (90.7 kg)  02/03/21 194 lb (88 kg)   Reviewed CMP and lipase completed 02/18/2021 by ED Maintain saxenda dose F/up in 59month Consider decrease in saxenda dose if GERD symptoms do not improve in 21month

## 2021-04-07 NOTE — Progress Notes (Signed)
Subjective:  Patient ID: Amy [name], female    DOB: 1985-09-30  Age: 36 y.o. MRN: [id number]  CC: Acute Visit (Pt states she needs protonix to help with acid reflux. Pt states at times the pain in her chest from the acid reflux is so severe it she has to take pain medication and would like to discuss this. )  HPI  Gastroesophageal reflux disease without esophagitis Epigastric pain and heart burn, worse at HS Worsening with use of sexanda injection. Does not want to d/c med at this time due to successful weight loss. Has made dietary modifications: low fat meal, avoids eating after 8pm (no improvement) No improvement with omeprazole. Some improvement with pantoprazole 40mg  daily. She was evaluated at ED 02/18/2021. Reviewed lab results and CT ABD report. No acute finding. Last appt with [facility] physicians GI: 01/2021 per patient.  complete pantoprazole 40mg  in AM, then will need to switch to 20mg . Start famotidine 40mg  in PM x 2weeks, then 20mg  in PM till complete prescription. Maintain diet modifications. Schedule appt with GI if symptoms do not improvement in 2weeks.  Obesity Lowest adult weight: 160lbs. 22lbs weight loss in last 7months. Wt Readings from Last 3 Encounters:  04/07/21 178 lb 3.2 oz (80.8 kg)  02/18/21 200 lb (90.7 kg)  02/03/21 194 lb (88 kg)   Reviewed CMP and lipase completed 02/18/2021 by ED Maintain saxenda dose F/up in 3month Consider decrease in saxenda dose if GERD symptoms do not improve in 56month  BP Readings from Last 3 Encounters:  04/07/21 104/70  02/18/21 119/75  02/03/21 116/70    Reviewed past Medical, Social and Family history today.  Outpatient Medications Prior to Visit  Medication Sig Dispense Refill   buPROPion (WELLBUTRIN) 100 MG tablet Take 1 tablet (100 mg total) by mouth 2 (two) times daily. 180 tablet 3   Insulin Pen Needle (PEN NEEDLES) 31G X 6 MM MISC 1 application by Does not apply route at bedtime. For saxenda pen 30  each 5   sertraline (ZOLOFT) 25 MG tablet Take 1 tablet (25 mg total) by mouth daily. 90 tablet 3   Tretinoin 0.05 % LOTN Apply topically.     valACYclovir (VALTREX) 500 MG tablet TAKE 1 TABLET BY MOUTH ONCE DAILY 90 tablet 1   Liraglutide -Weight Management (SAXENDA) 18 MG/3ML SOPN 2.4mg  daily 15 mL 1   CLINDAMYCIN PHOS & CLEANSER EX Apply topically. (Patient not taking: Reported on 04/07/2021)     Etonogestrel (NEXPLANON [place]) Inject into the skin. 3 years (Patient not taking: Reported on 04/07/2021)     omeprazole (PRILOSEC) 20 MG capsule Take 1 capsule (20 mg total) by mouth 2 (two) times daily before a meal. (Patient not taking: Reported on 04/07/2021) 30 capsule 0   ondansetron (ZOFRAN) 4 MG tablet Take 1 tablet (4 mg total) by mouth every 6 (six) hours. (Patient not taking: Reported on 04/07/2021) 30 tablet 0   No facility-administered medications prior to visit.    ROS See HPI  Objective:  BP 104/70 (BP Location: Left Arm, Patient Position: Sitting, Cuff Size: Normal)    Pulse 78    Temp 97.6 F (36.4 C) (Temporal)    Ht 5' 4.5" (1.638 m)    Wt 178 lb 3.2 oz (80.8 kg)    SpO2 99%    BMI 30.12 kg/m   Physical Exam Vitals reviewed.  Cardiovascular:     Rate and Rhythm: Normal rate.     Pulses: Normal pulses.  Pulmonary:  Effort: Pulmonary effort is normal.  Abdominal:     General: There is no distension.     Palpations: Abdomen is soft.     Tenderness: There is no abdominal tenderness.  Neurological:     Mental Status: She is oriented to person, place, and time.   Assessment & Plan:  This visit occurred during the SARS-CoV-2 public health emergency.  Safety protocols were in place, including screening questions prior to the visit, additional usage of staff PPE, and extensive cleaning of exam room while observing appropriate contact time as indicated for disinfecting solutions.   Moniqua was seen today for acute visit.  Diagnoses and all orders for this  visit:  Gastroesophageal reflux disease with esophagitis without hemorrhage -     pantoprazole (PROTONIX) 40 MG tablet; Take 1 tablet (40 mg total) by mouth daily. -     famotidine (PEPCID) 20 MG tablet; Take 1 tablet (20 mg total) by mouth at bedtime. Take 2tab bedtime x 2weeks, then 1tab till complete  Class 1 obesity due to excess calories with serious comorbidity and body mass index (BMI) of 30.0 to 30.9 in adult -     Liraglutide -Weight Management (SAXENDA) 18 MG/3ML SOPN; 2.4mg  daily   Problem List Items Addressed This Visit       Digestive   Gastroesophageal reflux disease without esophagitis - Primary    Epigastric pain and heart burn, worse at HS Worsening with use of sexanda injection. Does not want to d/c med at this time due to successful weight loss. Has made dietary modifications: low fat meal, avoids eating after 8pm (no improvement) No improvement with omeprazole. Some improvement with pantoprazole 40mg  daily. She was evaluated at ED 02/18/2021. Reviewed lab results and CT ABD report. No acute finding. Last appt with [facility] physicians GI: 01/2021 per patient.  complete pantoprazole 40mg  in AM, then will need to switch to 20mg . Start famotidine 40mg  in PM x 2weeks, then 20mg  in PM till complete prescription. Maintain diet modifications. Schedule appt with GI if symptoms do not improvement in 2weeks.      Relevant Medications   pantoprazole (PROTONIX) 40 MG tablet   famotidine (PEPCID) 20 MG tablet     Other   Obesity    Lowest adult weight: 160lbs. 22lbs weight loss in last 87months. Wt Readings from Last 3 Encounters:  04/07/21 178 lb 3.2 oz (80.8 kg)  02/18/21 200 lb (90.7 kg)  02/03/21 194 lb (88 kg)   Reviewed CMP and lipase completed 02/18/2021 by ED Maintain saxenda dose F/up in 64month Consider decrease in saxenda dose if GERD symptoms do not improve in 49month      Relevant Medications   Liraglutide -Weight Management (SAXENDA) 18 MG/3ML SOPN     Follow-up: Return in about 3 months (around 07/05/2021) for weight management and GERD.  02/05/21, NP

## 2021-04-12 ENCOUNTER — Telehealth: Payer: Self-pay | Admitting: Nurse Practitioner

## 2021-04-12 DIAGNOSIS — E6609 Other obesity due to excess calories: Secondary | ICD-10-CM

## 2021-04-12 NOTE — Telephone Encounter (Signed)
Caller Name:[name], Amy Call back phone #: [phone]  MEDICATION(S): Liraglutide -Weight Management (SAXENDA) 18 MG/3ML SOPN2   Days of Med Remaining:   Has the patient contacted their pharmacy (YES/NO)?  yes IF YES, when and what did the pharmacy advise? She has a PA for this script and they don't have it IF NO, request that the patient contact the pharmacy for the refills in the future.             The pharmacy will send an electronic request (except for controlled medications).  Preferred Pharmacy: [id number] - [place], [address]  [place], [place] [address]  Phone:  [phone]  Fax:  [phone]  DEA #:  [id number]  ~~~Please advise patient/caregiver to allow 2-3 business days to process RX refills.

## 2021-04-13 ENCOUNTER — Telehealth: Payer: Self-pay | Admitting: Nurse Practitioner

## 2021-04-13 NOTE — Telephone Encounter (Signed)
Pt called she called insurance about Saxenda and the coverage got denied and she was wondering what she can do.

## 2021-04-17 NOTE — Telephone Encounter (Signed)
Pt checking on status of this message. She is wondering what she should do.

## 2021-04-19 NOTE — Telephone Encounter (Signed)
Spoke with patient and informed her a new PA was started and would contact her by the end of the week  ?

## 2021-04-24 ENCOUNTER — Ambulatory Visit: Payer: No Typology Code available for payment source | Admitting: Nurse Practitioner

## 2021-04-29 ENCOUNTER — Other Ambulatory Visit: Payer: Self-pay | Admitting: Nurse Practitioner

## 2021-04-29 DIAGNOSIS — K2100 Gastro-esophageal reflux disease with esophagitis, without bleeding: Secondary | ICD-10-CM

## 2021-05-01 NOTE — Telephone Encounter (Signed)
Spoke with patient and she states she did not request Rx and she is fine without it being filled because still as pills.  ?

## 2021-05-01 NOTE — Assessment & Plan Note (Signed)
She chose to d/c saxenda due to lack of insurance coverage ?

## 2021-05-01 NOTE — Assessment & Plan Note (Signed)
>>  ASSESSMENT AND PLAN FOR MORBID OBESITY (HCC) WRITTEN ON 05/01/2021  7:00 PM BY Parrish Daddario LUM, NP  She chose to d/c saxenda due to lack of insurance coverage

## 2021-05-01 NOTE — Telephone Encounter (Signed)
Spoke with patient and she states insurance informed her the Rx was denied again. Pt states she is okay with this because she thinks she is okay not taking medication and would like to see how she does without it for a while.  ?

## 2021-05-04 ENCOUNTER — Ambulatory Visit: Payer: No Typology Code available for payment source | Admitting: Nurse Practitioner

## 2021-05-09 ENCOUNTER — Other Ambulatory Visit: Payer: Self-pay | Admitting: Nurse Practitioner

## 2021-05-09 DIAGNOSIS — K2100 Gastro-esophageal reflux disease with esophagitis, without bleeding: Secondary | ICD-10-CM

## 2021-05-09 NOTE — Telephone Encounter (Signed)
Updated script sent in by provider  ?

## 2021-05-10 ENCOUNTER — Ambulatory Visit: Payer: No Typology Code available for payment source | Admitting: Nurse Practitioner

## 2021-06-11 ENCOUNTER — Other Ambulatory Visit: Payer: Self-pay | Admitting: Nurse Practitioner

## 2021-06-11 DIAGNOSIS — K2100 Gastro-esophageal reflux disease with esophagitis, without bleeding: Secondary | ICD-10-CM

## 2021-06-13 NOTE — Telephone Encounter (Signed)
Chart supports Rx ?Last seen 04/2021 ?Next OV 06/29/21 ?

## 2021-06-14 ENCOUNTER — Ambulatory Visit (INDEPENDENT_AMBULATORY_CARE_PROVIDER_SITE_OTHER): Payer: No Typology Code available for payment source | Admitting: Nurse Practitioner

## 2021-06-14 ENCOUNTER — Encounter: Payer: Self-pay | Admitting: Nurse Practitioner

## 2021-06-14 DIAGNOSIS — K2100 Gastro-esophageal reflux disease with esophagitis, without bleeding: Secondary | ICD-10-CM

## 2021-06-14 DIAGNOSIS — F411 Generalized anxiety disorder: Secondary | ICD-10-CM

## 2021-06-14 DIAGNOSIS — F339 Major depressive disorder, recurrent, unspecified: Secondary | ICD-10-CM

## 2021-06-14 MED ORDER — PANTOPRAZOLE SODIUM 40 MG PO TBEC: DELAYED_RELEASE_TABLET | ORAL | 1 refills | Status: DC

## 2021-06-14 MED ORDER — SERTRALINE HCL 50 MG PO TABS: 50.0000 mg | ORAL_TABLET | Freq: Every day | ORAL | 3 refills | Status: DC

## 2021-06-14 NOTE — Progress Notes (Signed)
? ?             Established Patient Visit ? ?Patient: Amy [name]   DOB: 03/24/1985   36 y.o. Female  MRN: [id number] ?Visit Date: 06/14/2021 ? ?Subjective:  ?  ?Chief Complaint  ?Patient presents with  ? Follow-up  ?  F/u on anxiety and weight. ?No other issues or concerns.  ?Pt will schedule PAP exam with her GYN  ? ?HPI ?GAD (generalized anxiety disorder) ?Reports increased anxiety over possible weight gain without use of GLP-1 injection. Has maintain sessions with therapist. Last appt 06/13/21. Admits to checking her weight daily and biometric measurements once a month. ? ?Provided reassurance that her weight has not increased. ?Advised to check weight once every 2weeks ?Maintain wellbutrin dose ?Increase zoloft to 50mg  ?Maintain sessions with therapist ?F/up in 1-24months as needed ? ?Gastroesophageal reflux disease ?Improved without use of saxenda injection ?Maintain pantoprazole dose ? ?Wt Readings from Last 3 Encounters:  ?06/14/21 176 lb 9.6 oz (80.1 kg)  ?04/07/21 178 lb 3.2 oz (80.8 kg)  ?02/18/21 200 lb (90.7 kg)  ?  ? ?  06/14/2021  ? 10:39 AM 04/07/2021  ? 11:22 AM 02/03/2021  ?  9:10 AM  ?Depression screen PHQ 2/9  ?Decreased Interest 0 0 0  ?Down, Depressed, Hopeless 1 0 0  ?PHQ - 2 Score 1 0 0  ?Altered sleeping 1 0   ?Tired, decreased energy 0 0   ?Change in appetite 0 0   ?Feeling bad or failure about yourself  2 0   ?Trouble concentrating 0 0   ?Moving slowly or fidgety/restless 0 0   ?Suicidal thoughts 0 0   ?PHQ-9 Score 4 0   ?Difficult doing work/chores Not difficult at all    ?  ? ?  06/14/2021  ? 10:40 AM 04/07/2021  ? 11:22 AM 02/03/2021  ?  9:10 AM 11/18/2020  ?  8:50 AM  ?GAD 7 : Generalized Anxiety Score  ?Nervous, Anxious, on Edge 2 1 0 1  ?Control/stop worrying 1 0 0 1  ?Worry too much - different things 2 0 1 2  ?Trouble relaxing 1 0 0 1  ?Restless 0 0 0 0  ?Easily annoyed or irritable 0 0 0 0  ?Afraid - awful might happen 0 0 0 0  ?Total GAD 7 Score 6 1 1 5   ?Anxiety Difficulty Somewhat  difficult Not difficult at all Not difficult at all Somewhat difficult  ? ?Reviewed medical, surgical, and social history today ? ?Medications: ?Outpatient Medications Prior to Visit  ?Medication Sig Note  ? buPROPion (WELLBUTRIN) 100 MG tablet Take 1 tablet (100 mg total) by mouth 2 (two) times daily.   ? Insulin Pen Needle (PEN NEEDLES) 31G X 6 MM MISC 1 application by Does not apply route at bedtime. For saxenda pen   ? Tretinoin 0.05 % LOTN Apply topically.   ? VALACYCLOVIR HCL PO Take by mouth. 06/14/2021: prn  ? [DISCONTINUED] pantoprazole (PROTONIX) 40 MG tablet TAKE 1 TABLET BY MOUTH EVERY DAY *90 DAY SUPPLY REQUIRED   ? [DISCONTINUED] sertraline (ZOLOFT) 25 MG tablet Take 1 tablet (25 mg total) by mouth daily.   ? famotidine (PEPCID) 20 MG tablet Take 1 tablet (20 mg total) by mouth at bedtime. Take 2tab bedtime x 2weeks, then 1tab till complete (Patient not taking: Reported on 06/14/2021)   ? ?No facility-administered medications prior to visit.  ? ?Reviewed past medical and social history.  ? ?ROS per HPI above ? ? ?   ?  Objective:  ?BP 100/64 (BP Location: Left Arm, Patient Position: Sitting, Cuff Size: Normal)   Pulse 68   Temp (!) 97.1 ?F (36.2 ?C) (Temporal)   Ht 5' 4.5" (1.638 m)   Wt 176 lb 9.6 oz (80.1 kg)   SpO2 99%   BMI 29.85 kg/m?  ? ?  ? ?Physical Exam ?Constitutional:   ?   General: She is not in acute distress. ?Cardiovascular:  ?   Rate and Rhythm: Normal rate.  ?   Pulses: Normal pulses.  ?Pulmonary:  ?   Effort: Pulmonary effort is normal.  ?Neurological:  ?   Mental Status: She is alert and oriented to person, place, and time.  ?Psychiatric:     ?   Attention and Perception: Attention normal.     ?   Mood and Affect: Mood is anxious.     ?   Speech: Speech normal.     ?   Behavior: Behavior is cooperative.     ?   Thought Content: Thought content normal.     ?   Cognition and Memory: Cognition and memory normal.     ?   Judgment: Judgment normal.  ?  ?No results found for any visits  on 06/14/21. ?   ?Assessment & Plan:  ?  ?Problem List Items Addressed This Visit   ? ?  ? Digestive  ? Gastroesophageal reflux disease  ?  Improved without use of saxenda injection ?Maintain pantoprazole dose ? ?  ?  ? Relevant Medications  ? pantoprazole (PROTONIX) 40 MG tablet  ?  ? Other  ? Depression, recurrent (HCC)  ? Relevant Medications  ? sertraline (ZOLOFT) 50 MG tablet  ? GAD (generalized anxiety disorder)  ?  Reports increased anxiety over possible weight gain without use of GLP-1 injection. Has maintain sessions with therapist. Last appt 06/13/21. Admits to checking her weight daily and biometric measurements once a month. ? ?Provided reassurance that her weight has not increased. ?Advised to check weight once every 2weeks ?Maintain wellbutrin dose ?Increase zoloft to 50mg  ?Maintain sessions with therapist ?F/up in 1-69months as needed ? ?  ?  ? Relevant Medications  ? sertraline (ZOLOFT) 50 MG tablet  ? ?Return in about 3 months (around 09/13/2021) for anxiety and weight management. ? ?  ? ?09/15/2021, NP ? ? ?

## 2021-06-14 NOTE — Assessment & Plan Note (Signed)
Reports increased anxiety over possible weight gain without use of GLP-1 injection. Has maintain sessions with therapist. Last appt 06/13/21. Admits to checking her weight daily and biometric measurements once a month. ? ?Provided reassurance that her weight has not increased. ?Advised to check weight once every 2weeks ?Maintain wellbutrin dose ?Increase zoloft to 50mg  ?Maintain sessions with therapist ?F/up in 1-68months as needed ?

## 2021-06-14 NOTE — Assessment & Plan Note (Signed)
Improved without use of saxenda injection ?Maintain pantoprazole dose ?

## 2021-06-23 ENCOUNTER — Telehealth: Payer: Self-pay | Admitting: Nurse Practitioner

## 2021-06-23 ENCOUNTER — Other Ambulatory Visit: Payer: Self-pay | Admitting: Nurse Practitioner

## 2021-06-23 DIAGNOSIS — F40243 Fear of flying: Secondary | ICD-10-CM

## 2021-06-23 MED ORDER — DIAZEPAM 2 MG PO TABS: 1.0000 mg | ORAL_TABLET | Freq: Every day | ORAL | 0 refills | Status: DC | PRN

## 2021-06-23 NOTE — Telephone Encounter (Signed)
Pt is flying on Sunday and would like the script for the valume. ? ?

## 2021-06-29 ENCOUNTER — Ambulatory Visit: Payer: No Typology Code available for payment source | Admitting: Nurse Practitioner

## 2021-07-13 ENCOUNTER — Ambulatory Visit: Payer: No Typology Code available for payment source | Admitting: Nurse Practitioner

## 2021-07-20 ENCOUNTER — Other Ambulatory Visit: Payer: Self-pay

## 2021-09-13 ENCOUNTER — Ambulatory Visit: Payer: No Typology Code available for payment source | Admitting: Nurse Practitioner

## 2021-10-05 ENCOUNTER — Ambulatory Visit: Payer: No Typology Code available for payment source | Admitting: Nurse Practitioner

## 2021-10-17 ENCOUNTER — Other Ambulatory Visit: Payer: Self-pay | Admitting: Nurse Practitioner

## 2021-10-17 NOTE — Telephone Encounter (Signed)
Chart supports Rx Last OV: 05/2021 Next OV: 09/2021  

## 2021-10-19 ENCOUNTER — Encounter: Payer: Self-pay | Admitting: Nurse Practitioner

## 2021-10-19 ENCOUNTER — Ambulatory Visit (INDEPENDENT_AMBULATORY_CARE_PROVIDER_SITE_OTHER): Payer: No Typology Code available for payment source | Admitting: Nurse Practitioner

## 2021-10-19 VITALS — BP 112/72 | HR 80 | Temp 97.3°F | Ht 64.5 in | Wt 172.4 lb

## 2021-10-19 DIAGNOSIS — K2100 Gastro-esophageal reflux disease with esophagitis, without bleeding: Secondary | ICD-10-CM

## 2021-10-19 DIAGNOSIS — E7800 Pure hypercholesterolemia, unspecified: Secondary | ICD-10-CM | POA: Diagnosis not present

## 2021-10-19 DIAGNOSIS — E663 Overweight: Secondary | ICD-10-CM | POA: Diagnosis not present

## 2021-10-19 DIAGNOSIS — F411 Generalized anxiety disorder: Secondary | ICD-10-CM

## 2021-10-19 DIAGNOSIS — R7303 Prediabetes: Secondary | ICD-10-CM | POA: Diagnosis not present

## 2021-10-19 LAB — LIPID PANEL
Cholesterol: 187 mg/dL (ref 0–200)
HDL: 62.7 mg/dL (ref >39.00)
LDL Cholesterol: 116 mg/dL — ABNORMAL HIGH (ref 0–99)
NonHDL: 124.69
Total CHOL/HDL Ratio: 3
Triglycerides: 42 mg/dL (ref 0.0–149.0)
VLDL: 8.4 mg/dL (ref 0.0–40.0)

## 2021-10-19 LAB — HEMOGLOBIN A1C: Hgb A1c MFr Bld: 5.5 % (ref 4.6–6.5)

## 2021-10-19 NOTE — Assessment & Plan Note (Addendum)
Continuous weight loss with optiva diet and exercise Lost additional 4lbs in last 47months. Total weight loss 56lbs in last 1year. Weight loss goal per patient 157lbs Wt Readings from Last 3 Encounters:  10/19/21 172 lb 6.4 oz (78.2 kg)  06/14/21 176 lb 9.6 oz (80.1 kg)  04/07/21 178 lb 3.2 oz (80.8 kg)   Repeat lipid panel and hgbA1c Maintain current lifestyle modification.

## 2021-10-19 NOTE — Assessment & Plan Note (Signed)
Resolved with discontinuation of GLP injection. Advised to use PPI prn

## 2021-10-19 NOTE — Patient Instructions (Signed)
Go to lab Maintain current medications Schedule appt with therapist

## 2021-10-19 NOTE — Progress Notes (Signed)
Established Patient Visit  Patient: Amy [name]   DOB: 1985-09-27   36 y.o. Female  MRN: [id number] Visit Date: 10/19/2021  Subjective:    Chief Complaint  Patient presents with   Office Visit    Weight management / anxiety f/u No concerns  Requesting records for pap   HPI Overweight (BMI 25.0-29.9) Continuous weight loss with optiva diet and exercise Lost additional 4lbs in last 44months. Total weight loss 56lbs in last 1year. Weight loss goal per patient 157lbs Wt Readings from Last 3 Encounters:  10/19/21 172 lb 6.4 oz (78.2 kg)  06/14/21 176 lb 9.6 oz (80.1 kg)  04/07/21 178 lb 3.2 oz (80.8 kg)   Repeat lipid panel and hgbA1c Maintain current lifestyle modification.  GAD (generalized anxiety disorder) Increase anxiety due to upcoming changes at work: work [employer] day. She is concerned this might impede her weight loss journey. She has appts with psychology once a month. Current use of zoloft and wellbutrin.  Advised to f/up with therapist May need to complete fmla form if needs to increased frequency of appts with therapist. Maintain current medication doses F/up in 38months  Gastroesophageal reflux disease Resolved with discontinuation of GLP injection. Advised to use PPI prn     10/19/2021    8:37 AM 06/14/2021   10:39 AM 04/07/2021   11:22 AM  Depression screen PHQ 2/9  Decreased Interest 0 0 0  Down, Depressed, Hopeless 0 1 0  PHQ - 2 Score 0 1 0  Altered sleeping 0 1 0  Tired, decreased energy 0 0 0  Change in appetite 0 0 0  Feeling bad or failure about yourself  1 2 0  Trouble concentrating 0 0 0  Moving slowly or fidgety/restless 0 0 0  Suicidal thoughts 0 0 0  PHQ-9 Score 1 4 0  Difficult doing work/chores Not difficult at all Not difficult at all        10/19/2021    8:37 AM 06/14/2021   10:40 AM 04/07/2021   11:22 AM 02/03/2021    9:10 AM  GAD 7 : Generalized Anxiety Score  Nervous, Anxious, on Edge 1 2 1  0  Control/stop  worrying 0 1 0 0  Worry too much - different things 2 2 0 1  Trouble relaxing 0 1 0 0  Restless 0 0 0 0  Easily annoyed or irritable 0 0 0 0  Afraid - awful might happen 0 0 0 0  Total GAD 7 Score 3 6 1 1   Anxiety Difficulty Somewhat difficult Somewhat difficult Not difficult at all Not difficult at all   Reviewed medical, surgical, and social history today  Medications: Outpatient Medications Prior to Visit  Medication Sig   buPROPion (WELLBUTRIN) 100 MG tablet Take 1 tablet (100 mg total) by mouth 2 (two) times daily.   sertraline (ZOLOFT) 50 MG tablet Take 1 tablet (50 mg total) by mouth daily.   Tretinoin 0.05 % LOTN Apply topically.   valACYclovir (VALTREX) 500 MG tablet TAKE 1 TABLET (500 MG TOTAL) BY MOUTH DAILY.   [DISCONTINUED] diazepam (VALIUM) 2 MG tablet Take 0.5-1 tablets (1-2 mg total) by mouth daily as needed for anxiety. Fear of flying   [DISCONTINUED] Insulin Pen Needle (PEN NEEDLES) 31G X 6 MM MISC 1 application by Does not apply route at bedtime. For saxenda pen   [DISCONTINUED] pantoprazole (PROTONIX) 40 MG tablet TAKE 1 TABLET BY MOUTH EVERY  DAY *90 DAY SUPPLY REQUIRED   pantoprazole (PROTONIX) 40 MG tablet Take 1 tablet (40 mg total) by mouth daily as needed.   [DISCONTINUED] VALACYCLOVIR HCL PO Take by mouth. (Patient not taking: Reported on 10/19/2021)   No facility-administered medications prior to visit.   Reviewed past medical and social history.   ROS per HPI above      Objective:  BP 112/72 (BP Location: Right Arm, Patient Position: Sitting, Cuff Size: Normal)   Pulse 80   Temp (!) 97.3 F (36.3 C) (Temporal)   Ht 5' 4.5" (1.638 m)   Wt 172 lb 6.4 oz (78.2 kg)   SpO2 (!) 80%   BMI 29.14 kg/m      Physical Exam Constitutional:      General: She is not in acute distress. Cardiovascular:     Rate and Rhythm: Normal rate and regular rhythm.     Pulses: Normal pulses.     Heart sounds: Normal heart sounds.  Pulmonary:     Effort: Pulmonary  effort is normal.     Breath sounds: Normal breath sounds.  Neurological:     Mental Status: She is alert and oriented to person, place, and time.     No results found for any visits on 10/19/21.    Assessment & Plan:    Problem List Items Addressed This Visit       Digestive   Gastroesophageal reflux disease    Resolved with discontinuation of GLP injection. Advised to use PPI prn      Relevant Medications   pantoprazole (PROTONIX) 40 MG tablet     Other   GAD (generalized anxiety disorder)    Increase anxiety due to upcoming changes at work: work [employer] day. She is concerned this might impede her weight loss journey. She has appts with psychology once a month. Current use of zoloft and wellbutrin.  Advised to f/up with therapist May need to complete fmla form if needs to increased frequency of appts with therapist. Maintain current medication doses F/up in 35months      Overweight (BMI 25.0-29.9) - Primary    Continuous weight loss with optiva diet and exercise Lost additional 4lbs in last 15months. Total weight loss 56lbs in last 1year. Weight loss goal per patient 157lbs Wt Readings from Last 3 Encounters:  10/19/21 172 lb 6.4 oz (78.2 kg)  06/14/21 176 lb 9.6 oz (80.1 kg)  04/07/21 178 lb 3.2 oz (80.8 kg)   Repeat lipid panel and hgbA1c Maintain current lifestyle modification.      Prediabetes   Relevant Orders   Hemoglobin A1c   Other Visit Diagnoses     Elevated LDL cholesterol level       Relevant Orders   Lipid panel      Return in about 4 months (around 02/05/2022) for CPE (fasting).     Alysia Penna, NP

## 2021-10-19 NOTE — Assessment & Plan Note (Signed)
Increase anxiety due to upcoming changes at work: work [employer] day. She is concerned this might impede her weight loss journey. She has appts with psychology once a month. Current use of zoloft and wellbutrin.  Advised to f/up with therapist May need to complete fmla form if needs to increased frequency of appts with therapist. Maintain current medication doses F/up in 63months

## 2021-11-01 ENCOUNTER — Telehealth: Payer: Self-pay | Admitting: Nurse Practitioner

## 2021-11-01 NOTE — Telephone Encounter (Signed)
Pt advised.

## 2021-11-01 NOTE — Telephone Encounter (Signed)
Caller Name: Ronin Crager Call back phone #: [phone]  Reason for Call: Pt wants to be prescribed something for weight loss. I attempted to make an appointment for her, however she says she just had one (8/31). Please call pt to advise

## 2022-01-02 ENCOUNTER — Ambulatory Visit (INDEPENDENT_AMBULATORY_CARE_PROVIDER_SITE_OTHER): Payer: No Typology Code available for payment source | Admitting: Nurse Practitioner

## 2022-01-02 ENCOUNTER — Encounter: Payer: Self-pay | Admitting: Nurse Practitioner

## 2022-01-02 VITALS — BP 124/78 | HR 73 | Temp 96.2°F | Ht 64.5 in | Wt 192.0 lb

## 2022-01-02 DIAGNOSIS — Z6832 Body mass index (BMI) 32.0-32.9, adult: Secondary | ICD-10-CM

## 2022-01-02 DIAGNOSIS — F331 Major depressive disorder, recurrent, moderate: Secondary | ICD-10-CM | POA: Diagnosis not present

## 2022-01-02 DIAGNOSIS — E6609 Other obesity due to excess calories: Secondary | ICD-10-CM | POA: Diagnosis not present

## 2022-01-02 DIAGNOSIS — F411 Generalized anxiety disorder: Secondary | ICD-10-CM | POA: Diagnosis not present

## 2022-01-02 MED ORDER — HYDROXYZINE PAMOATE 25 MG PO CAPS: 25.0000 mg | ORAL_CAPSULE | Freq: Three times a day (TID) | ORAL | 0 refills | Status: DC | PRN

## 2022-01-02 MED ORDER — BUPROPION HCL ER (SR) 150 MG PO TB12: 150.0000 mg | ORAL_TABLET | Freq: Two times a day (BID) | ORAL | 5 refills | Status: DC

## 2022-01-02 NOTE — Progress Notes (Signed)
Established Patient Visit  Patient: Amy [name]   DOB: [birth year]   36 y.o. Female  MRN: [id number] Visit Date: 01/02/2022  Subjective:    Chief Complaint  Patient presents with  . Office Visit    Weight loss discussion  Was on saxenda but made her sick  Anxiety symptoms; pain  Requesting records for pap   HPI Obesity Weight gain due to binge eating and increase ETOH consumption Wt Readings from Last 3 Encounters:  01/02/22 192 lb (87.1 kg)  10/19/21 172 lb 6.4 oz (78.2 kg)  06/14/21 176 lb 9.6 oz (80.1 kg)    Unable to tolerate GLP injection Do not recommend use of phentermine due to uncontrolled anxiety and depression Advised to continue daily exercise and heart healthy diet Advised to quit ETOH use F/up in 110month  Depression, recurrent (HCC) Worsening anxiety and depression in last 77months. This has led to increased eating and alcohol consumption, difficulty with focus at work, easily overwhelmed No SI/HI/hallucination, no previous IVC Fhx of alcoholism: father. Reports she is compliant with wellbutrin 100mg  BID and zoloft 50mg  daily  Advised to stop all ETOH consumption. Increased wellbutrin to 150mg  BID Maintain zoloft dose Entered referral to psychiatry: bipolar depression vs ADD? F/up in 31month  Reviewed medical, surgical, and social history today  Medications: Outpatient Medications Prior to Visit  Medication Sig  . pantoprazole (PROTONIX) 40 MG tablet Take 1 tablet (40 mg total) by mouth daily as needed.  . sertraline (ZOLOFT) 50 MG tablet Take 1 tablet (50 mg total) by mouth daily.  . valACYclovir (VALTREX) 500 MG tablet TAKE 1 TABLET (500 MG TOTAL) BY MOUTH DAILY.  . [DISCONTINUED] buPROPion (WELLBUTRIN) 100 MG tablet Take 1 tablet (100 mg total) by mouth 2 (two) times daily.  . Tretinoin 0.05 % LOTN Apply topically. (Patient not taking: Reported on 01/02/2022)   No facility-administered medications prior to visit.   Reviewed past  medical and social history.   ROS per HPI above      Objective:  BP 124/78 (BP Location: Right Arm, Patient Position: Sitting, Cuff Size: Normal)   Pulse 73   Temp (!) 96.2 F (35.7 C) (Temporal)   Ht 5' 4.5" (1.638 m)   Wt 192 lb (87.1 kg)   SpO2 99%   BMI 32.45 kg/m      Physical Exam Cardiovascular:     Rate and Rhythm: Normal rate.     Pulses: Normal pulses.  Neurological:     Mental Status: She is alert and oriented to person, place, and time.  Psychiatric:        Attention and Perception: Attention normal.        Mood and Affect: Mood is anxious.        Speech: Speech normal.        Behavior: Behavior is cooperative.        Thought Content: Thought content normal.        Cognition and Memory: Cognition and memory normal.    No results found for any visits on 01/02/22.    Assessment & Plan:    Problem List Items Addressed This Visit       Other   Depression, recurrent (HCC) - Primary    Worsening anxiety and depression in last 51months. This has led to increased eating and alcohol consumption, difficulty with focus at work, easily overwhelmed No SI/HI/hallucination, no previous IVC Fhx of alcoholism: father. Reports  she is compliant with wellbutrin 100mg  BID and zoloft 50mg  daily  Advised to stop all ETOH consumption. Increased wellbutrin to 150mg  BID Maintain zoloft dose Entered referral to psychiatry: bipolar depression vs ADD? F/up in 45month      Relevant Medications   buPROPion (WELLBUTRIN SR) 150 MG 12 hr tablet   hydrOXYzine (VISTARIL) 25 MG capsule   GAD (generalized anxiety disorder)   Relevant Medications   buPROPion (WELLBUTRIN SR) 150 MG 12 hr tablet   hydrOXYzine (VISTARIL) 25 MG capsule   Obesity    Weight gain due to binge eating and increase ETOH consumption Wt Readings from Last 3 Encounters:  01/02/22 192 lb (87.1 kg)  10/19/21 172 lb 6.4 oz (78.2 kg)  06/14/21 176 lb 9.6 oz (80.1 kg)    Unable to tolerate GLP injection Do not  recommend use of phentermine due to uncontrolled anxiety and depression Advised to continue daily exercise and heart healthy diet Advised to quit ETOH use F/up in 29month      Relevant Orders   Ambulatory referral to Psychiatry   Return in about 4 weeks (around 01/30/2022) for depression and anxiety.     06/16/21, NP

## 2022-01-02 NOTE — Patient Instructions (Addendum)
Stop alcohol consumption Increase wellbutrin to 150mg  BID Maintain zoloft dose Use vistaril 25mg  as needed for anxiety You will be contacted to schedule appt with psychiatry Increased psychology sessions to every other week.  Managing Anxiety, Adult After being diagnosed with anxiety, you may be relieved to know why you have felt or behaved a certain way. You may also feel overwhelmed about the treatment ahead and what it will mean for your life. With care and support, you can manage this condition. How to manage lifestyle changes Managing stress and anxiety  Stress is your body's reaction to life changes and events, both good and bad. Most stress will last just a few hours, but stress can be ongoing and can lead to more than just stress. Although stress can play a major role in anxiety, it is not the same as anxiety. Stress is usually caused by something external, such as a deadline, test, or competition. Stress normally passes after the triggering event has ended.  Anxiety is caused by something internal, such as imagining a terrible outcome or worrying that something will go wrong that will devastate you. Anxiety often does not go away even after the triggering event is over, and it can become long-term (chronic) worry. It is important to understand the differences between stress and anxiety and to manage your stress effectively so that it does not lead to an anxious response. Talk with your health care provider or a counselor to learn more about reducing anxiety and stress. He or she may suggest tension reduction techniques, such as: Music therapy. Spend time creating or listening to music that you enjoy and that inspires you. Mindfulness-based meditation. Practice being aware of your normal breaths while not trying to control your breathing. It can be done while sitting or walking. Centering prayer. This involves focusing on a word, phrase, or sacred image that means something to you and brings  you peace. Deep breathing. To do this, expand your stomach and inhale slowly through your nose. Hold your breath for 3-5 seconds. Then exhale slowly, letting your stomach muscles relax. Self-talk. Learn to notice and identify thought patterns that lead to anxiety reactions and change those patterns to thoughts that feel peaceful. Muscle relaxation. Taking time to tense muscles and then relax them. Choose a tension reduction technique that fits your lifestyle and personality. These techniques take time and practice. Set aside 5-15 minutes a day to do them. Therapists can offer counseling and training in these techniques. The training to help with anxiety may be covered by some insurance plans. Other things you can do to manage stress and anxiety include: Keeping a stress diary. This can help you learn what triggers your reaction and then learn ways to manage your response. Thinking about how you react to certain situations. You may not be able to control everything, but you can control your response. Making time for activities that help you relax and not feeling guilty about spending your time in this way. Doing visual imagery. This involves imagining or creating mental pictures to help you relax. Practicing yoga. Through yoga poses, you can lower tension and promote relaxation.  Medicines Medicines can help ease symptoms. Medicines for anxiety include: Antidepressant medicines. These are usually prescribed for long-term daily control. Anti-anxiety medicines. These may be added in severe cases, especially when panic attacks occur. Medicines will be prescribed by a health care provider. When used together, medicines, psychotherapy, and tension reduction techniques may be the most effective treatment. Relationships Relationships can play a  big part in helping you recover. Try to spend more time connecting with trusted friends and family members. Consider going to couples counseling if you have a  partner, taking family education classes, or going to family therapy. Therapy can help you and others better understand your condition. How to recognize changes in your anxiety Everyone responds differently to treatment for anxiety. Recovery from anxiety happens when symptoms decrease and stop interfering with your daily activities at home or work. This may mean that you will start to: Have better concentration and focus. Worry will interfere less in your daily thinking. Sleep better. Be less irritable. Have more energy. Have improved memory. It is also important to recognize when your condition is getting worse. Contact your health care provider if your symptoms interfere with home or work and you feel like your condition is not improving. Follow these instructions at home: Activity Exercise. Adults should do the following: Exercise for at least 150 minutes each week. The exercise should increase your heart rate and make you sweat (moderate-intensity exercise). Strengthening exercises at least twice a week. Get the right amount and quality of sleep. Most adults need 7-9 hours of sleep each night. Lifestyle  Eat a healthy diet that includes plenty of vegetables, fruits, whole grains, low-fat dairy products, and lean protein. Do not eat a lot of foods that are high in fats, added sugars, or salt (sodium). Make choices that simplify your life. Do not use any products that contain nicotine or tobacco. These products include cigarettes, chewing tobacco, and vaping devices, such as e-cigarettes. If you need help quitting, ask your health care provider. Avoid caffeine, alcohol, and certain over-the-counter cold medicines. These may make you feel worse. Ask your pharmacist which medicines to avoid. General instructions Take over-the-counter and prescription medicines only as told by your health care provider. Keep all follow-up visits. This is important. Where to find support You can get help and  support from these sources: Self-help groups. Online and [employer]. A trusted spiritual leader. Couples counseling. Family education classes. Family therapy. Where to find more information You may find that joining a support group helps you deal with your anxiety. The following sources can help you locate counselors or support groups near you: Mental Health America: www.mentalhealthamerica.net Anxiety and Depression Association of [place] (ADAA): [employer].de [employer] Mental Illness (NAMI): www.nami.org Contact a health care provider if: You have a hard time staying focused or finishing daily tasks. You spend many hours a day feeling worried about everyday life. You become exhausted by worry. You start to have headaches or frequently feel tense. You develop chronic nausea or diarrhea. Get help right away if: You have a racing heart and shortness of breath. You have thoughts of hurting yourself or others. If you ever feel like you may hurt yourself or others, or have thoughts about taking your own life, get help right away. Go to your nearest emergency department or: Call your local emergency services (911 in the [place] Call a suicide crisis helpline, such as the National Suicide Prevention Lifeline at 760-089-3060 or 988 in the [place] This is open 24 hours a day in the [place] Text the Crisis Text Line at 778-230-4188 (in the [place] Summary Taking steps to learn and use tension reduction techniques can help calm you and help prevent triggering an anxiety reaction. When used together, medicines, psychotherapy, and tension reduction techniques may be the most effective treatment. Family, friends, and partners can play a big part in supporting you. This  information is not intended to replace advice given to you by your health care provider. Make sure you discuss any questions you have with your health care provider. Document Revised: 08/31/2020 Document Reviewed:  05/29/2020 Elsevier Patient Education  2023 [employer].

## 2022-01-02 NOTE — Assessment & Plan Note (Addendum)
Weight gain due to binge eating and increase ETOH consumption Wt Readings from Last 3 Encounters:  01/02/22 192 lb (87.1 kg)  10/19/21 172 lb 6.4 oz (78.2 kg)  06/14/21 176 lb 9.6 oz (80.1 kg)    Unable to tolerate GLP injection Do not recommend use of phentermine due to uncontrolled anxiety and depression Advised to continue daily exercise and heart healthy diet Advised to quit ETOH use F/up in 62month

## 2022-01-02 NOTE — Assessment & Plan Note (Signed)
Worsening anxiety and depression in last 60months. This has led to increased eating and alcohol consumption, difficulty with focus at work, easily overwhelmed No SI/HI/hallucination, no previous IVC Fhx of alcoholism: father. Reports she is compliant with wellbutrin 100mg  BID and zoloft 50mg  daily  Advised to stop all ETOH consumption. Increased wellbutrin to 150mg  BID Maintain zoloft dose Entered referral to psychiatry: bipolar depression vs ADD? F/up in 76month

## 2022-01-12 ENCOUNTER — Other Ambulatory Visit: Payer: Self-pay | Admitting: Nurse Practitioner

## 2022-01-12 DIAGNOSIS — K2100 Gastro-esophageal reflux disease with esophagitis, without bleeding: Secondary | ICD-10-CM

## 2022-01-15 NOTE — Telephone Encounter (Signed)
Chart supports Rx Last OV: 12/2021 Next OV: 01/2022  

## 2022-01-24 ENCOUNTER — Other Ambulatory Visit: Payer: Self-pay | Admitting: Nurse Practitioner

## 2022-01-24 DIAGNOSIS — F331 Major depressive disorder, recurrent, moderate: Secondary | ICD-10-CM

## 2022-01-27 ENCOUNTER — Encounter (HOSPITAL_COMMUNITY): Payer: Self-pay | Admitting: Psychiatry

## 2022-01-27 ENCOUNTER — Telehealth (HOSPITAL_BASED_OUTPATIENT_CLINIC_OR_DEPARTMENT_OTHER): Payer: No Typology Code available for payment source | Admitting: Psychiatry

## 2022-01-27 DIAGNOSIS — F411 Generalized anxiety disorder: Secondary | ICD-10-CM | POA: Diagnosis not present

## 2022-01-27 DIAGNOSIS — F339 Major depressive disorder, recurrent, unspecified: Secondary | ICD-10-CM | POA: Diagnosis not present

## 2022-01-27 MED ORDER — SERTRALINE HCL 50 MG PO TABS: 75.0000 mg | ORAL_TABLET | Freq: Every day | ORAL | 0 refills | Status: DC

## 2022-01-27 NOTE — Progress Notes (Signed)
Psychiatric Initial Adult Assessment   Patient Identification: Amy [name] MRN:  [id number] Date of Evaluation:  01/27/2022 Referral Source: primary care Chief Complaint:   Chief Complaint  Patient presents with   Depression   Establish Care   Visit Diagnosis:    ICD-10-CM   1. Depression, recurrent (HCC)  F33.9     2. GAD (generalized anxiety disorder)  F41.1      Virtual Visit via Video Note  I connected with Marcy Salvo on 01/27/22 at  8:00 AM EST by a video enabled telemedicine application and verified that I am speaking with the correct person using two identifiers.  Location: Patient: home Provider: home office   I discussed the limitations of evaluation and management by telemedicine and the availability of in person appointments. The patient expressed understanding and agreed to proceed.      I discussed the assessment and treatment plan with the patient. The patient was provided an opportunity to ask questions and all were answered. The patient agreed with the plan and demonstrated an understanding of the instructions.   The patient was advised to call back or seek an in-person evaluation if the symptoms worsen or if the condition fails to improve as anticipated.  I provided 60 minutes of non-face-to-face time during this encounter including chart review and documentation.   History of Present Illness: Patient is a 36 years old currently married African-American female referred by primary care physician office to establish care for depression, anxiety.  Patient currently works full-time with [employer] works remotely she has 95-year-old son  Patient gives history of having highs and lows she has been depressed around months having low depression sadness decreased interest withdrawn disturbed sleep energy and appetite feeling of despair and low about herself increased or disturbed eating habits her primary care physician started Wellbutrin currently she is on  Wellbutrin 150 and sertraline 50 mg for the last couple of weeks she has noticed improvement depression is improving her despair or sadness has improved.  She still endorses worries excessive worries worries have improved certain extent but she still dwells on the negative thoughts and have difficulty getting out of those negative thoughts.  She does have episode of feeling happy elevated but not doing any risky behavior or excessive indulge in activities she feels it is just happy and she can get something more done and it does not last long.   She has been using alcohol nearly 1 bottle of wine per day in September 2 her weight was excessive but she stopped on November 11 and as of now she has not relapsed and not drinking she feels that since then the medication has been more effective and she feels less depressed  There is no associated psychotic symptoms She has lost weight overall in 1 year but she has gained some weight over the last 1 month possible correlation with stopping alcohol she has indulge in more at times binge eating Aggravating factor:  work stress Modifying factor: husband, son  Duration more then one year Severity some better with medications and stopping alcohol  Past Psychiatric History: depression  Previous Psychotropic Medications: No   Substance Abuse History in the last 12 months:  Yes.    Consequences of Substance Abuse: Discussed effect of alcohol on depression, judjement  Past Medical History:  Past Medical History:  Diagnosis Date   Anxiety    with flying only   GERD (gastroesophageal reflux disease)    Herpes    Vaginal Pap  smear, abnormal     Past Surgical History:  Procedure Laterality Date   CERVICAL BIOPSY  W/ LOOP ELECTRODE EXCISION     CESAREAN SECTION N/A 07/20/2018   Procedure: CESAREAN SECTION;  Surgeon: Essie HartPinn, Walda, MD;  Location: [facility];  Service: Obstetrics;  Laterality: N/A;   WISDOM TOOTH EXTRACTION      Family Psychiatric  History: sister depression Aunt schizophrenia  Family History:  Family History  Problem Relation Age of Onset   Hypertension Mother    Hyperlipidemia Mother    Hyperlipidemia Father    Alcohol abuse Father    Cirrhosis Father    Hypertension Maternal Aunt    Heart disease Paternal Grandmother     Social History:   Social History   Socioeconomic History   Marital status: Married    Spouse name: Not on file   Number of children: 1   Years of education: Not on file   Highest education level: Not on file  Occupational History   Occupation: Teacher, adult educationN    Employer: [employer]    Comment: GI[employer]endo at [employer]  Tobacco Use   Smoking status: Never   Smokeless tobacco: Never  Vaping Use   Vaping Use: Never used  Substance and Sexual Activity   Alcohol use: Yes    Alcohol/week: 7.0 standard drinks of alcohol    Types: 6 Glasses of wine, 1 Shots of liquor per week   Drug use: No   Sexual activity: Yes    Birth control/protection: Condom  Other Topics Concern   Not on file  Social History Narrative   Not on file   Social Determinants of Health   Financial Resource Strain: Low Risk  (07/08/2018)   Overall Financial Resource Strain (CARDIA)    Difficulty of Paying Living Expenses: Not hard at all  Food Insecurity: No Food Insecurity (07/08/2018)   Hunger Vital Sign    Worried About Running Out of Food in the Last Year: Never true    Ran Out of Food in the Last Year: Never true  Transportation Needs: Unknown (07/08/2018)   PRAPARE - Administrator, Civil ServiceTransportation    Lack of Transportation (Medical): No    Lack of Transportation (Non-Medical): Not on file  Physical Activity: Not on file  Stress: No Stress Concern Present (07/08/2018)   [employer] - Occupational Stress Questionnaire    Feeling of Stress : Only a little  Social Connections: Not on file    Additional Social History: grew up with parents, dad would yell a lot, he was alcoholic   Allergies:  No Known  Allergies  Metabolic Disorder Labs: Lab Results  Component Value Date   HGBA1C 5.5 10/19/2021   MPG 105 02/28/2017   No results found for: "PROLACTIN" Lab Results  Component Value Date   CHOL 187 10/19/2021   TRIG 42.0 10/19/2021   HDL 62.70 10/19/2021   CHOLHDL 3 10/19/2021   VLDL 8.4 10/19/2021   LDLCALC 116 (H) 10/19/2021   LDLCALC 128 (H) 02/03/2021   Lab Results  Component Value Date   TSH 2.07 02/03/2021    Therapeutic Level Labs: No results found for: "LITHIUM" No results found for: "CBMZ" No results found for: "VALPROATE"  Current Medications: Current Outpatient Medications  Medication Sig Dispense Refill   buPROPion (WELLBUTRIN SR) 150 MG 12 hr tablet TAKE 1 TABLET BY MOUTH TWICE A DAY 180 tablet 2   hydrOXYzine (VISTARIL) 25 MG capsule Take 1 capsule (25 mg total) by mouth every 8 (eight) hours  as needed. 30 capsule 0   pantoprazole (PROTONIX) 40 MG tablet TAKE 1 TABLET BY MOUTH EVERY DAY *90 DAY SUPPLY REQUIRED 90 tablet 1   sertraline (ZOLOFT) 50 MG tablet Take 1 tablet (50 mg total) by mouth daily. 90 tablet 3   Tretinoin 0.05 % LOTN Apply topically. (Patient not taking: Reported on 01/02/2022)     valACYclovir (VALTREX) 500 MG tablet TAKE 1 TABLET (500 MG TOTAL) BY MOUTH DAILY. 90 tablet 1   No current facility-administered medications for this visit.     Psychiatric Specialty Exam: Review of Systems  Cardiovascular:  Negative for chest pain.  Skin:  Negative for rash.  Psychiatric/Behavioral:  Positive for dysphoric mood.     There were no vitals taken for this visit.There is no height or weight on file to calculate BMI.  General Appearance: Casual  Eye Contact:  Fair  Speech:  Slow  Volume:  Decreased  Mood:  Depressed  Affect:  Constricted  Thought Process:  Goal Directed  Orientation:  Full (Time, Place, and Person)  Thought Content:  Rumination  Suicidal Thoughts:  No  Homicidal Thoughts:  No  Memory:  Immediate;   Fair  Judgement:   Fair  Insight:  Fair  Psychomotor Activity:  Decreased  Concentration:  Concentration: Fair  Recall:  Fair  Fund of Knowledge:Good  Language: Good  Akathisia:  No  Handed:    AIMS (if indicated):  not done  Assets:  Desire for Improvement Financial Resources/Insurance Housing  ADL's:  Intact  Cognition: WNL  Sleep:  Fair   Screenings: GAD-7    Flowsheet Row Office Visit from 01/02/2022 in [facility] Office Visit from 10/19/2021 in [facility] Office Visit from 06/14/2021 in [facility] Office Visit from 04/07/2021 in [facility] Office Visit from 02/03/2021 in [facility]  Total GAD-7 Score 12 3 6 1 1       PHQ2-9    Flowsheet Row Video Visit [facility] 01/27/2022 in [facility] Office Visit from 01/02/2022 in [facility] Office Visit from 10/19/2021 in [facility] Office Visit from 06/14/2021 in [facility] Office Visit from 04/07/2021 in [facility]  PHQ-2 Total Score 2 2 0 1 0  PHQ-9 Total Score 10 11 1 4  0      Flowsheet Row Video Visit from 01/27/2022 in [facility] ED from 02/18/2021 in [facility] from 12/20/2020 in [facility]  C-SSRS RISK CATEGORY No Risk No Risk No Risk       Assessment and Plan: as follows   MDD recurrent moderate:  doing some better with medications In regarding depression has improved we will continue Wellbutrin 150 mg twice a day but increase the sertraline to 75 mg continue therapy to work on the negative thoughts GAD: improved but still dwells on the negative thoughts or excessive worries we talked about distracting from obsessive thoughts add more activities including work out or walking routines.  Increase  sertraline to 75 mg    Alcohol use :  last alcohol one month ago, prior to that was one bottle a day. Discussed with observation not having any craving she understands she may have a propensity to relapse it is best advised to have a or the support group to join  Continue sertraline for underlying depression and anxiety  Also discussed to organize the days  and have more activities available to plan  Follow-up in 3 to 4 weeks or earlier if needed refill of sertraline sent she does have other refills call earlier if needed questions addressed Collaboration of Care: Primary Care Provider AEB reviewed notes and referral  Patient/Guardian was advised Release of Information must be obtained prior to any record release in order to collaborate their care with an outside provider. Patient/Guardian was advised if they have not already done so to contact the registration department to sign all necessary forms in order for [place] to release information regarding their care.   Consent: Patient/Guardian gives verbal consent for treatment and assignment of benefits for services provided during this visit. Patient/Guardian expressed understanding and agreed to proceed.   Thresa Ross, MD 12/9/20238:09 AM

## 2022-01-30 ENCOUNTER — Ambulatory Visit: Payer: No Typology Code available for payment source | Admitting: Nurse Practitioner

## 2022-02-13 ENCOUNTER — Telehealth: Payer: Self-pay | Admitting: Nurse Practitioner

## 2022-02-13 ENCOUNTER — Telehealth (HOSPITAL_COMMUNITY): Payer: Self-pay | Admitting: *Deleted

## 2022-02-13 NOTE — Telephone Encounter (Signed)
Pt advised.

## 2022-02-13 NOTE — Telephone Encounter (Signed)
Caller Name: Chandi Nicklin Call back phone #: [phone]  Reason for Call: Pt saw psychiatrist and he increased Zoloft dosage from 50mg  to 75mg . She started having bad headaches and so she decreased her dosage back to 50mg . She wanted for [facility] to be aware

## 2022-02-13 NOTE — Telephone Encounter (Signed)
Pt called to advise that since increasing Zoloft dose to 75 mg she has been experiencing "migraines" daily and has decreased the dose back down to 50 mg. This has helped alleviate headaches. Pt wanted Dr. Gilmore Laroche to be aware as her next appointment is not until 03/12/22 she says.

## 2022-02-13 NOTE — Telephone Encounter (Signed)
Noted  

## 2022-02-23 ENCOUNTER — Ambulatory Visit (INDEPENDENT_AMBULATORY_CARE_PROVIDER_SITE_OTHER): Payer: No Typology Code available for payment source | Admitting: Nurse Practitioner

## 2022-02-23 ENCOUNTER — Encounter: Payer: Self-pay | Admitting: Nurse Practitioner

## 2022-02-23 VITALS — BP 114/74 | HR 80 | Temp 97.0°F | Ht 64.5 in | Wt 204.2 lb

## 2022-02-23 DIAGNOSIS — J0100 Acute maxillary sinusitis, unspecified: Secondary | ICD-10-CM

## 2022-02-23 DIAGNOSIS — F411 Generalized anxiety disorder: Secondary | ICD-10-CM

## 2022-02-23 MED ORDER — AZITHROMYCIN 250 MG PO TABS: 250.0000 mg | ORAL_TABLET | Freq: Every day | ORAL | 0 refills | Status: DC

## 2022-02-23 MED ORDER — FLUTICASONE PROPIONATE 50 MCG/ACT NA SUSP: 2.0000 | Freq: Every day | NASAL | 1 refills | Status: DC

## 2022-02-23 MED ORDER — SALINE SPRAY 0.65 % NA SOLN: 1.0000 | NASAL | 0 refills | Status: AC | PRN

## 2022-02-23 NOTE — Progress Notes (Signed)
Established Patient Visit  Patient: Amy [name]   DOB: 23-Sep-1985   37 y.o. Female  MRN: [id number] Visit Date: 02/23/2022  Subjective:    Chief Complaint  Patient presents with   Office Visit    Anxiety/ depression  C/o random headaches x 3 weeks, says she noticed it when she increased her zoloft  Requesting records for pap   Sinusitis This is a recurrent problem. The current episode started 1 to 4 weeks ago. The problem has been waxing and waning since onset. There has been no fever. Associated symptoms include congestion, headaches and sinus pressure. Pertinent negatives include no chills, coughing, diaphoresis, ear pain, hoarse voice, neck pain, shortness of breath, sneezing, sore throat or swollen glands. Past treatments include acetaminophen. The treatment provided mild relief.   GAD (generalized anxiety disorder) Unable to tolerate zoloft 75mg , so dose decreased to 50mg . Some improvement in frontal headache. Advised to maintain upcoming appt with psychiatry  Headache relief with use of ibuprofen 600mg  BID and tylenol 1000mg  TID or Excedrin 2tab daily.  Reviewed medical, surgical, and social history today  Medications: Outpatient Medications Prior to Visit  Medication Sig   buPROPion (WELLBUTRIN SR) 150 MG 12 hr tablet TAKE 1 TABLET BY MOUTH TWICE A DAY   hydrOXYzine (VISTARIL) 25 MG capsule Take 1 capsule (25 mg total) by mouth every 8 (eight) hours as needed.   pantoprazole (PROTONIX) 40 MG tablet TAKE 1 TABLET BY MOUTH EVERY DAY *90 DAY SUPPLY REQUIRED   sertraline (ZOLOFT) 50 MG tablet Take 1.5 tablets (75 mg total) by mouth daily.   valACYclovir (VALTREX) 500 MG tablet TAKE 1 TABLET (500 MG TOTAL) BY MOUTH DAILY.   Tretinoin 0.05 % LOTN Apply topically. (Patient not taking: Reported on 01/02/2022)   No facility-administered medications prior to visit.   Reviewed past medical and social history.   ROS per HPI above      Objective:  BP 114/74  (BP Location: Left Arm, Patient Position: Sitting, Cuff Size: Normal)   Pulse 80   Temp (!) 97 F (36.1 C) (Temporal)   Ht 5' 4.5" (1.638 m)   Wt 204 lb 3.2 oz (92.6 kg)   SpO2 98%   BMI 34.51 kg/m      Physical Exam HENT:     Nose: Nasal tenderness, mucosal edema and congestion present. No rhinorrhea.     Right Nostril: Occlusion present.     Left Nostril: Occlusion present.     Right Turbinates: Enlarged and swollen.     Left Turbinates: Enlarged and swollen.     Right Sinus: Maxillary sinus tenderness and frontal sinus tenderness present.     Left Sinus: Maxillary sinus tenderness and frontal sinus tenderness present.  Cardiovascular:     Rate and Rhythm: Normal rate.     Pulses: Normal pulses.  Pulmonary:     Effort: Pulmonary effort is normal.  Neurological:     Mental Status: She is alert and oriented to person, place, and time.     Cranial Nerves: No cranial nerve deficit.     No results found for any visits on 02/23/22.    Assessment & Plan:    Problem List Items Addressed This Visit       Other   GAD (generalized anxiety disorder)    Unable to tolerate zoloft 75mg , so dose decreased to 50mg . Some improvement in frontal headache. Advised to maintain upcoming appt with psychiatry  Other Visit Diagnoses     Acute non-recurrent maxillary sinusitis    -  Primary   Relevant Medications   sodium chloride (OCEAN) 0.65 % SOLN nasal spray   fluticasone (FLONASE) 50 MCG/ACT nasal spray   azithromycin (ZITHROMAX Z-PAK) 250 MG tablet     Advised about risk of GI bleed and renal insufficiency with high dose of NSAIDs. Use midol 2tabs every 8hrs as needed for headache in daytime and tylenol 1000ng at bedtime as needed. Do not take more than 4000mg  of tylenol per day. Call office if headache does not improve in 2weeks.  Return if symptoms worsen or fail to improve.     Wilfred Lacy, NP

## 2022-02-23 NOTE — Patient Instructions (Signed)
F/up with psychiatry as scheduled Use midol 2tabs every 8hrs as needed for headache in daytime and tylenol 1000ng at bedtime as needed. Do not take more than 4000mg  of tylenol per day. Call office if headache does not improve in 2weeks

## 2022-02-23 NOTE — Assessment & Plan Note (Signed)
Unable to tolerate zoloft 75mg , so dose decreased to 50mg . Some improvement in frontal headache. Advised to maintain upcoming appt with psychiatry

## 2022-02-24 ENCOUNTER — Other Ambulatory Visit: Payer: Self-pay | Admitting: Nurse Practitioner

## 2022-02-24 DIAGNOSIS — F322 Major depressive disorder, single episode, severe without psychotic features: Secondary | ICD-10-CM

## 2022-03-12 ENCOUNTER — Telehealth (INDEPENDENT_AMBULATORY_CARE_PROVIDER_SITE_OTHER): Payer: No Typology Code available for payment source | Admitting: Psychiatry

## 2022-03-12 ENCOUNTER — Encounter (HOSPITAL_COMMUNITY): Payer: Self-pay | Admitting: Psychiatry

## 2022-03-12 DIAGNOSIS — F411 Generalized anxiety disorder: Secondary | ICD-10-CM | POA: Diagnosis not present

## 2022-03-12 DIAGNOSIS — F339 Major depressive disorder, recurrent, unspecified: Secondary | ICD-10-CM | POA: Diagnosis not present

## 2022-03-12 MED ORDER — SERTRALINE HCL 50 MG PO TABS: 50.0000 mg | ORAL_TABLET | Freq: Every day | ORAL | 0 refills | Status: DC

## 2022-03-12 MED ORDER — HYDROXYZINE PAMOATE 25 MG PO CAPS: 25.0000 mg | ORAL_CAPSULE | Freq: Every day | ORAL | 0 refills | Status: DC | PRN

## 2022-03-12 NOTE — Progress Notes (Signed)
[place] Follow up visit  Patient Identification: Amy [name] MRN:  [id number] Date of Evaluation:  03/12/2022 Referral Source: primary care Chief Complaint:   No chief complaint on file. Follow up depression, anxiety Visit Diagnosis:    ICD-10-CM   1. Depression, recurrent (HCC)  F33.9 sertraline (ZOLOFT) 50 MG tablet    2. GAD (generalized anxiety disorder)  F41.1 sertraline (ZOLOFT) 50 MG tablet    hydrOXYzine (VISTARIL) 25 MG capsule     Virtual Visit via Video Note  I connected with Amy [name] on 03/12/22 at  3:00 PM EST by a video enabled telemedicine application and verified that I am speaking with the correct person using two identifiers.  Location: Patient: home Provider: home office   I discussed the limitations of evaluation and management by telemedicine and the availability of in person appointments. The patient expressed understanding and agreed to proceed.      I discussed the assessment and treatment plan with the patient. The patient was provided an opportunity to ask questions and all were answered. The patient agreed with the plan and demonstrated an understanding of the instructions.   The patient was advised to call back or seek an in-person evaluation if the symptoms worsen or if the condition fails to improve as anticipated.  I provided 15 - 20 minutes of non-face-to-face time during this encounter including chart review and documentation.   History of Present Illness: Patient is a 37 years old currently married African-American female initially referred by primary care physician office to establish care for depression, anxiety.    Last visit was stressed, subdued, now has changed job leaving [employer] , joining inpatient at [employer] better zoloft 75mg  caused headachs, now back on 50mg   Worries are improving   Not drinking   Aggravating factor:  work stress but changing job now Modifying factor: husband, son  Duration more then one  year Severity some better with medications and stopping alcohol  Past Psychiatric History: depression  Previous Psychotropic Medications: No   Substance Abuse History in the last 12 months:  Yes.    Consequences of Substance Abuse: Discussed effect of alcohol on depression, judjement  Past Medical History:  Past Medical History:  Diagnosis Date   Anxiety    with flying only   GERD (gastroesophageal reflux disease)    Herpes    Vaginal Pap smear, abnormal     Past Surgical History:  Procedure Laterality Date   CERVICAL BIOPSY  W/ LOOP ELECTRODE EXCISION     CESAREAN SECTION N/A 07/20/2018   Procedure: CESAREAN SECTION;  Surgeon: Sanjuana Kava, MD;  Location: [facility];  Service: Obstetrics;  Laterality: N/A;   WISDOM TOOTH EXTRACTION      Family Psychiatric History: sister depression Aunt schizophrenia  Family History:  Family History  Problem Relation Age of Onset   Hypertension Mother    Hyperlipidemia Mother    Hyperlipidemia Father    Alcohol abuse Father    Cirrhosis Father    Hypertension Maternal Aunt    Heart disease Paternal Grandmother     Social History:   Social History   Socioeconomic History   Marital status: Married    Spouse name: Not on file   Number of children: 1   Years of education: Not on file   Highest education level: Not on file  Occupational History   Occupation: Programmer, multimedia: [employer]    Comment: GI/endo at [employer]  Tobacco Use   Smoking  status: Never   Smokeless tobacco: Never  Vaping Use   Vaping Use: Never used  Substance and Sexual Activity   Alcohol use: Yes    Alcohol/week: 7.0 standard drinks of alcohol    Types: 6 Glasses of wine, 1 Shots of liquor per week   Drug use: No   Sexual activity: Yes    Birth control/protection: Condom  Other Topics Concern   Not on file  Social History Narrative   Not on file   Social Determinants of Health   Financial Resource Strain: Low Risk  (07/08/2018)   Overall Financial  Resource Strain (CARDIA)    Difficulty of Paying Living Expenses: Not hard at all  Food Insecurity: No Food Insecurity (07/08/2018)   Hunger Vital Sign    Worried About Running Out of Food in the Last Year: Never true    Ran Out of Food in the Last Year: Never true  Transportation Needs: Unknown (07/08/2018)   PRAPARE - Administrator, Civil Service (Medical): No    Lack of Transportation (Non-Medical): Not on file  Physical Activity: Not on file  Stress: No Stress Concern Present (07/08/2018)   [employer] of Occupational Health - Occupational Stress Questionnaire    Feeling of Stress : Only a little  Social Connections: Not on file    Additional Social History: grew up with parents, dad would yell a lot, he was alcoholic   Allergies:  No Known Allergies  Metabolic Disorder Labs: Lab Results  Component Value Date   HGBA1C 5.5 10/19/2021   MPG 105 02/28/2017   No results found for: "PROLACTIN" Lab Results  Component Value Date   CHOL 187 10/19/2021   TRIG 42.0 10/19/2021   HDL 62.70 10/19/2021   CHOLHDL 3 10/19/2021   VLDL 8.4 10/19/2021   LDLCALC 116 (H) 10/19/2021   LDLCALC 128 (H) 02/03/2021   Lab Results  Component Value Date   TSH 2.07 02/03/2021    Therapeutic Level Labs: No results found for: "LITHIUM" No results found for: "CBMZ" No results found for: "VALPROATE"  Current Medications: Current Outpatient Medications  Medication Sig Dispense Refill   azithromycin (ZITHROMAX Z-PAK) 250 MG tablet Take 1 tablet (250 mg total) by mouth daily. Take 2tabs on first day, then 1tab once a day till complete 6 tablet 0   buPROPion (WELLBUTRIN SR) 150 MG 12 hr tablet TAKE 1 TABLET BY MOUTH TWICE A DAY 180 tablet 2   fluticasone (FLONASE) 50 MCG/ACT nasal spray Place 2 sprays into both nostrils daily. 16 g 1   hydrOXYzine (VISTARIL) 25 MG capsule Take 1 capsule (25 mg total) by mouth daily as needed. 30 capsule 0   pantoprazole (PROTONIX) 40 MG tablet  TAKE 1 TABLET BY MOUTH EVERY DAY *90 DAY SUPPLY REQUIRED 90 tablet 1   sertraline (ZOLOFT) 50 MG tablet Take 1 tablet (50 mg total) by mouth daily. 50 tablet 0   sodium chloride (OCEAN) 0.65 % SOLN nasal spray Place 1 spray into both nostrils as needed for congestion. 15 mL 0   Tretinoin 0.05 % LOTN Apply topically. (Patient not taking: Reported on 01/02/2022)     valACYclovir (VALTREX) 500 MG tablet TAKE 1 TABLET (500 MG TOTAL) BY MOUTH DAILY. 90 tablet 1   No current facility-administered medications for this visit.     Psychiatric Specialty Exam: Review of Systems  Cardiovascular:  Negative for chest pain.  Skin:  Negative for rash.  Psychiatric/Behavioral:  Negative for agitation.     There  were no vitals taken for this visit.There is no height or weight on file to calculate BMI.  General Appearance: Casual  Eye Contact:  Fair  Speech:  Slow  Volume:  Decreased  Mood: better  Affect:  Constricted  Thought Process:  Goal Directed  Orientation:  Full (Time, Place, and Person)  Thought Content:  Rumination  Suicidal Thoughts:  No  Homicidal Thoughts:  No  Memory:  Immediate;   Fair  Judgement:  Fair  Insight:  Fair  Psychomotor Activity:  Decreased  Concentration:  Concentration: Fair  Recall:  [employer] of Knowledge:Good  Language: Good  Akathisia:  No  Handed:    AIMS (if indicated):  not done  Assets:  Desire for Improvement Financial Resources/Insurance Housing  ADL's:  Intact  Cognition: WNL  Sleep:  Fair   Screenings: GAD-7    Flowsheet Row Office Visit from 02/23/2022 in [place] at [facility] Visit from 01/02/2022 in [place] at [facility] Visit from 10/19/2021 in [place] at [facility] Visit from 06/14/2021 in [place] at [facility] Visit from 04/07/2021 in [place] at [facility]  Total  GAD-7 Score 11 12 3 6 1       PHQ2-9    [place] Visit from 02/23/2022 in [place] at [facility] Video Visit from 01/27/2022 in [facility] Office Visit from 01/02/2022 in [place] at [facility] Visit from 10/19/2021 in [place] at [facility] Visit from 06/14/2021 in [place] at [facility]  PHQ-2 Total Score 2 2 2  0 1  PHQ-9 Total Score 9 10 11 1 4       Flowsheet Row Video Visit from 01/27/2022 in [facility] ED from [facility]/18/2021 in [facility] Department at [facility] ED from 12/20/2020 in [facility] Emergency Department at [place] No Risk No Risk No Risk       Assessment and Plan: as follows   Prior documentation reviewed  MDD recurrent moderate:  doing better, chaning job, continue zoloft 50mg  wellbutrin bid  GAD: better continue zoloft   Alcohol use :  denies recent use, discussed relapse prevention Continue sertraline for underlying depression and anxiety  Also discussed to organize the days and have more activities available to plan  Follow-up in 3 to 4 weeks or earlier if needed refill of sertraline sent she does have other refills call earlier if needed questions addressed Collaboration of Care: Primary Care Provider AEB reviewed notes and referral  Patient/Guardian was advised Release of Information must be obtained prior to any record release in order to collaborate their care with an outside provider. Patient/Guardian was advised if they have not already done so to contact the registration department to sign all necessary forms in order for [place] to release information regarding their care.   Consent: Patient/Guardian gives verbal consent for treatment and assignment of benefits for services provided during  this visit. Patient/Guardian expressed understanding and agreed to proceed.   Merian Capron, MD 1/22/20243:13 PM

## 2022-03-14 ENCOUNTER — Other Ambulatory Visit: Payer: Self-pay | Admitting: Nurse Practitioner

## 2022-03-14 DIAGNOSIS — K2100 Gastro-esophageal reflux disease with esophagitis, without bleeding: Secondary | ICD-10-CM

## 2022-03-14 DIAGNOSIS — F411 Generalized anxiety disorder: Secondary | ICD-10-CM

## 2022-03-14 DIAGNOSIS — F322 Major depressive disorder, single episode, severe without psychotic features: Secondary | ICD-10-CM

## 2022-03-14 DIAGNOSIS — F339 Major depressive disorder, recurrent, unspecified: Secondary | ICD-10-CM

## 2022-03-15 ENCOUNTER — Telehealth: Payer: No Typology Code available for payment source | Admitting: Physician Assistant

## 2022-03-15 DIAGNOSIS — B379 Candidiasis, unspecified: Secondary | ICD-10-CM | POA: Diagnosis not present

## 2022-03-15 DIAGNOSIS — T3695XA Adverse effect of unspecified systemic antibiotic, initial encounter: Secondary | ICD-10-CM | POA: Diagnosis not present

## 2022-03-15 MED ORDER — FLUCONAZOLE 150 MG PO TABS
ORAL_TABLET | ORAL | 0 refills | Status: DC
Start: 2022-03-15 — End: 2022-05-17

## 2022-03-15 NOTE — Progress Notes (Signed)
Virtual Visit Consent   Amy [name], you are scheduled for a virtual visit with a [facility] provider today. Just as with appointments in the office, your consent must be obtained to participate. Your consent will be active for this visit and any virtual visit you may have with one of our providers in the next 365 days. If you have a MyChart account, a copy of this consent can be sent to you electronically.  As this is a virtual visit, video technology does not allow for your provider to perform a traditional examination. This may limit your provider's ability to fully assess your condition. If your provider identifies any concerns that need to be evaluated in person or the need to arrange testing (such as labs, EKG, etc.), we will make arrangements to do so. Although advances in technology are sophisticated, we cannot ensure that it will always work on either your end or our end. If the connection with a video visit is poor, the visit may have to be switched to a telephone visit. With either a video or telephone visit, we are not always able to ensure that we have a secure connection.  By engaging in this virtual visit, you consent to the provision of healthcare and authorize for your insurance to be billed (if applicable) for the services provided during this visit. Depending on your insurance coverage, you may receive a charge related to this service.  I need to obtain your verbal consent now. Are you willing to proceed with your visit today? Amy [name] has provided verbal consent on 03/15/2022 for a virtual visit (video or telephone). Leeanne Rio, [place]  Date: 03/15/2022 3:37 PM  Virtual Visit via Video Note   I, Leeanne Rio, connected with  Amy [name]  ([id number], 05-30-1985) on 03/15/22 at  3:30 PM EST by a video-enabled telemedicine application and verified that I am speaking with the correct person using two identifiers.  Location: Patient: Virtual Visit  Location Patient: Home Provider: Virtual Visit Location Provider: Home Office   I discussed the limitations of evaluation and management by telemedicine and the availability of in person appointments. The patient expressed understanding and agreed to proceed.    History of Present Illness: Amy [name] is a 37 y.o. who identifies as a female who was assigned female at birth, and is being seen today for possible yeast infection. Has been on 2 weeks of penicillin QID after an extensive dental procedure. Today is last day of antibiotic. Starting yesterday she noted some redness and itching of vulva, worsening today. Denies change in soaps, lotions, etc. No substantial discharge so far. Has history of a yeast infection and this feels identical to prior episode. No pregnancy concerns.    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 Repeat in 3 days if needed., Disp: 2 tablet, Rfl: 0   penicillin v potassium (VEETID) 500 MG tablet, Take 500 mg by mouth 4 (four) times daily., Disp: ,  Rfl:    buPROPion (WELLBUTRIN SR) 150 MG 12 hr tablet, TAKE 1 TABLET BY MOUTH TWICE A DAY, Disp: 180 tablet, Rfl: 2   hydrOXYzine (VISTARIL) 25 MG capsule, Take 1 capsule (25 mg total) by mouth daily as needed., Disp: 30 capsule, Rfl: 0   pantoprazole (PROTONIX) 40 MG tablet, TAKE 1 TABLET BY MOUTH EVERY DAY *90 DAY SUPPLY REQUIRED, Disp: 90 tablet, Rfl: 1    sertraline (ZOLOFT) 50 MG tablet, Take 1 tablet (50 mg total) by mouth daily., Disp: 50 tablet, Rfl: 0   sodium chloride (OCEAN) 0.65 % SOLN nasal spray, Place 1 spray into both nostrils as needed for congestion., Disp: 15 mL, Rfl: 0   Tretinoin 0.05 % LOTN, Apply topically. (Patient not taking: Reported on 01/02/2022), Disp: , Rfl:    valACYclovir (VALTREX) 500 MG tablet, TAKE 1 TABLET (500 MG TOTAL) BY MOUTH DAILY., Disp: 90 tablet, Rfl: 1  Observations/Objective: Patient is well-developed, well-nourished in no acute distress.  Resting comfortably at home.  Head is normocephalic, atraumatic.  No labored breathing. Speech is clear and coherent with logical content.  Patient is alert and oriented at baseline.   Assessment and Plan: 1. Antibiotic-induced yeast infection - fluconazole (DIFLUCAN) 150 MG tablet; Take 1 tablet PO once. Repeat in 3 days if needed.  Dispense: 2 tablet; Refill: 0  Thankfully on last day of antibiotic. Supportive measures reviewed. Start 2 dose course of Diflucan for treatment. Follow-up in person if not resolving, or for any new/worsening symptoms.   Follow Up Instructions: I discussed the assessment and treatment plan with the patient. The patient was provided an opportunity to ask questions and all were answered. The patient agreed with the plan and demonstrated an understanding of the instructions.  A copy of instructions were sent to the patient via MyChart unless otherwise noted below.   The patient was advised to call back or seek an in-person evaluation if the symptoms worsen or if the condition fails to improve as anticipated.  Time:  I spent 10 minutes with the patient via telehealth technology discussing the above problems/concerns.    Leeanne Rio, PA-C

## 2022-03-15 NOTE — Patient Instructions (Signed)
Amy [name], thank you for joining Leeanne Rio, PA-C for today's virtual visit.  While this provider is not your primary care provider (PCP), if your PCP is located in our provider database this encounter information will be shared with them immediately following your visit.   [place] account gives you access to today's visit and all your visits, tests, and labs performed at [facility] " click here if you don't have a [facility] account or go to [email or web address]  Consent: (Patient) Amy [name] provided verbal consent for this virtual visit at the beginning of the encounter.  Current Medications:  Current Outpatient Medications:    azithromycin (ZITHROMAX Z-PAK) 250 MG tablet, Take 1 tablet (250 mg total) by mouth daily. Take 2tabs on first day, then 1tab once a day till complete, Disp: 6 tablet, Rfl: 0   buPROPion (WELLBUTRIN SR) 150 MG 12 hr tablet, TAKE 1 TABLET BY MOUTH TWICE A DAY, Disp: 180 tablet, Rfl: 2   fluticasone (FLONASE) 50 MCG/ACT nasal spray, Place 2 sprays into both nostrils daily., Disp: 16 g, Rfl: 1   hydrOXYzine (VISTARIL) 25 MG capsule, Take 1 capsule (25 mg total) by mouth daily as needed., Disp: 30 capsule, Rfl: 0   pantoprazole (PROTONIX) 40 MG tablet, TAKE 1 TABLET BY MOUTH EVERY DAY *90 DAY SUPPLY REQUIRED, Disp: 90 tablet, Rfl: 1   sertraline (ZOLOFT) 50 MG tablet, Take 1 tablet (50 mg total) by mouth daily., Disp: 50 tablet, Rfl: 0   sodium chloride (OCEAN) 0.65 % SOLN nasal spray, Place 1 spray into both nostrils as needed for congestion., Disp: 15 mL, Rfl: 0   Tretinoin 0.05 % LOTN, Apply topically. (Patient not taking: Reported on 01/02/2022), Disp: , Rfl:    valACYclovir (VALTREX) 500 MG tablet, TAKE 1 TABLET (500 MG TOTAL) BY MOUTH DAILY., Disp: 90 tablet, Rfl: 1   Medications ordered in this encounter:  No orders of the defined types were placed in this encounter.    *If you need refills on other  medications prior to your next appointment, please contact your pharmacy*  Follow-Up: Call back or seek an in-person evaluation if the symptoms worsen or if the condition fails to improve as anticipated.  [facility] [phone]  Other Instructions Take the Diflucan as directed. Cold compresses to help with itch. Thankfully you are finishing up antibiotics today. If not resolving or any new/worsening symptoms despite treatment, please be evaluated in person.   Vaginal Yeast Infection, Adult  Vaginal yeast infection is a condition that causes vaginal discharge as well as soreness, swelling, and redness (inflammation) of the vagina. This is a common condition. Some women get this infection frequently. What are the causes? This condition is caused by a change in the normal balance of the yeast (Candida) and normal bacteria that live in the vagina. This change causes an overgrowth of yeast, which causes the inflammation. What increases the risk? The condition is more likely to develop in women who: Take antibiotic medicines. Have diabetes. Take birth control pills. Are pregnant. Douche often. Have a weak body defense system (immune system). Have been taking steroid medicines for a long time. Frequently wear tight clothing. What are the signs or symptoms? Symptoms of this condition include: White, thick, creamy vaginal discharge. Swelling, itching, redness, and irritation of the vagina. The lips of the vagina (labia) may be affected as well. Pain or a burning feeling while urinating. Pain during sex. How is this diagnosed? This  condition is diagnosed based on: Your medical history. A physical exam. A pelvic exam. Your health care provider will examine a sample of your vaginal discharge under a microscope. Your health care provider may send this sample for testing to confirm the diagnosis. How is this treated? This condition is treated with medicine. Medicines may be  over-the-counter or prescription. You may be told to use one or more of the following: Medicine that is taken by mouth (orally). Medicine that is applied as a cream (topically). Medicine that is inserted directly into the vagina (suppository). Follow these instructions at home: Take or apply over-the-counter and prescription medicines only as told by your health care provider. Do not use tampons until your health care provider approves. Do not have sex until your infection has cleared. Sex can prolong or worsen your symptoms of infection. Ask your health care provider when it is safe to resume sexual activity. Keep all follow-up visits. This is important. How is this prevented?  Do not wear tight clothes, such as pantyhose or tight pants. Wear breathable cotton underwear. Do not use douches, perfumed soap, creams, or powders. Wipe from front to back after using the toilet. If you have diabetes, keep your blood sugar levels under control. Ask your health care provider for other ways to prevent yeast infections. Contact a health care provider if: You have a fever. Your symptoms go away and then return. Your symptoms do not get better with treatment. Your symptoms get worse. You have new symptoms. You develop blisters in or around your vagina. You have blood coming from your vagina and it is not your menstrual period. You develop pain in your abdomen. Summary Vaginal yeast infection is a condition that causes discharge as well as soreness, swelling, and redness (inflammation) of the vagina. This condition is treated with medicine. Medicines may be over-the-counter or prescription. Take or apply over-the-counter and prescription medicines only as told by your health care provider. Do not douche. Resume sexual activity or use of tampons as instructed by your health care provider. Contact a health care provider if your symptoms do not get better with treatment or your symptoms go away and  then return. This information is not intended to replace advice given to you by your health care provider. Make sure you discuss any questions you have with your health care provider. Document Revised: 04/25/2020 Document Reviewed: 04/25/2020 Elsevier Patient Education  [place].    If you have been instructed to have an in-person evaluation today at a local Urgent Care facility, please use the link below. It will take you to a list of all of our available [facility], including address, phone number and hours of operation. Please do not delay care.  [facility]  If you or a family member do not have a primary care provider, use the link below to schedule a visit and establish care. When you choose a [facility] primary care physician or advanced practice provider, you gain a long-term partner in health. Find a Primary Care Provider  Learn more about [facility]'s in-office and virtual care options: [facility]

## 2022-03-19 ENCOUNTER — Telehealth: Payer: Self-pay | Admitting: Nurse Practitioner

## 2022-03-19 NOTE — Telephone Encounter (Signed)
Called & spoke w/ pt, immunizations records are ready for pick up

## 2022-03-19 NOTE — Telephone Encounter (Signed)
Caller Name: Namiko Call back phone #: [phone]   Reason for Call: Pt needs a copy of her immunization list. I told her you would call when ready and she can pick it up, up front.

## 2022-03-21 NOTE — Telephone Encounter (Signed)
Pt picked up her [facility]IR.

## 2022-04-05 ENCOUNTER — Other Ambulatory Visit (HOSPITAL_COMMUNITY): Payer: Self-pay | Admitting: Psychiatry

## 2022-04-05 DIAGNOSIS — F411 Generalized anxiety disorder: Secondary | ICD-10-CM

## 2022-04-16 DIAGNOSIS — M5441 Lumbago with sciatica, right side: Secondary | ICD-10-CM | POA: Insufficient documentation

## 2022-04-16 DIAGNOSIS — M545 Low back pain, unspecified: Secondary | ICD-10-CM | POA: Insufficient documentation

## 2022-04-21 IMAGING — CT CT ABD-PELV W/ CM
2 of 4 series · 16 of 46 positions shown, 18 images · IV contrast (omnipaque)
Comparison: No priors.

CLINICAL DATA: 35-year-old female with history of epigastric pain.

EXAM:
CT ABDOMEN AND PELVIS WITH CONTRAST
TECHNIQUE: Multidetector CT imaging of the abdomen and pelvis was performed
using the standard protocol following bolus administration of
intravenous contrast.
CONTRAST:  80mL OMNIPAQUE IOHEXOL 350 MG/ML SOLN

[Series 2: axial st · axial · 0.79mm/px · z∈[+1137,+1552]mm · 13 of 93 slices shown, 15 images]
[im 5/93  soft-tissue]
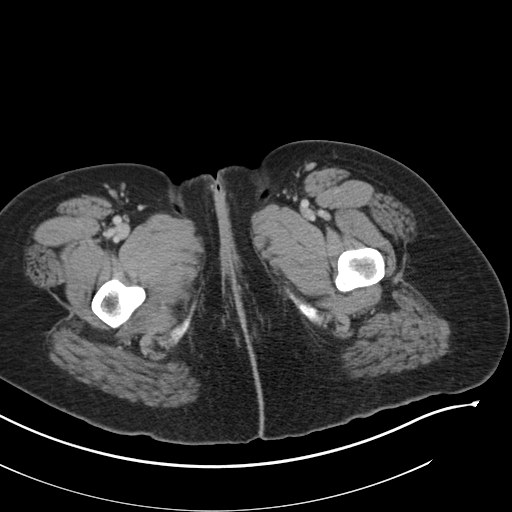
[im 5/93  bone]
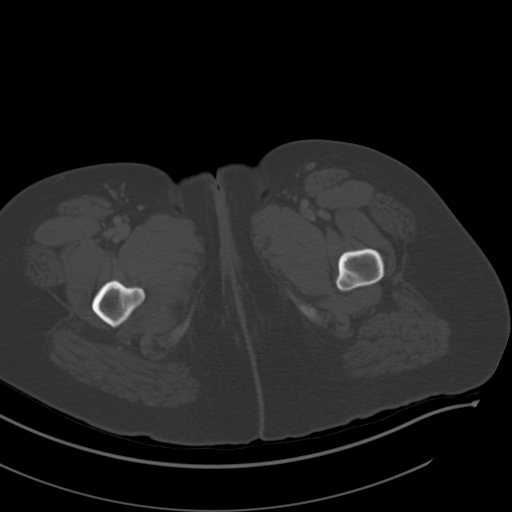
[im 14/93  soft-tissue]
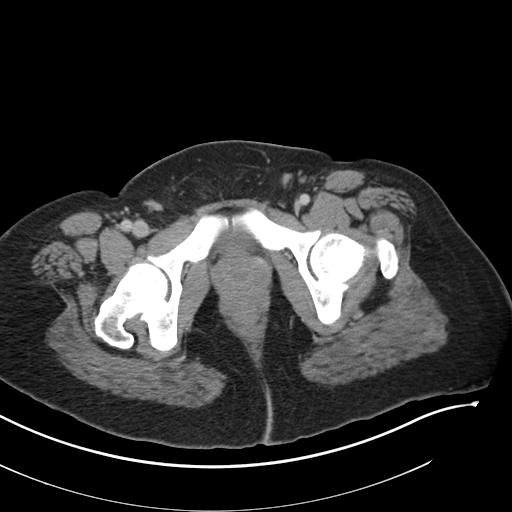
[im 19/93  soft-tissue]
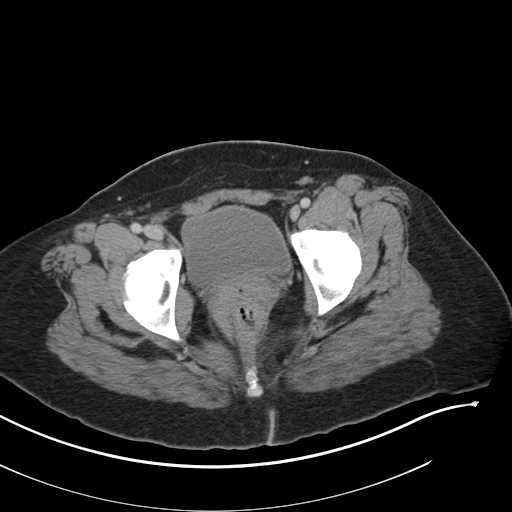
[im 28/93  soft-tissue]
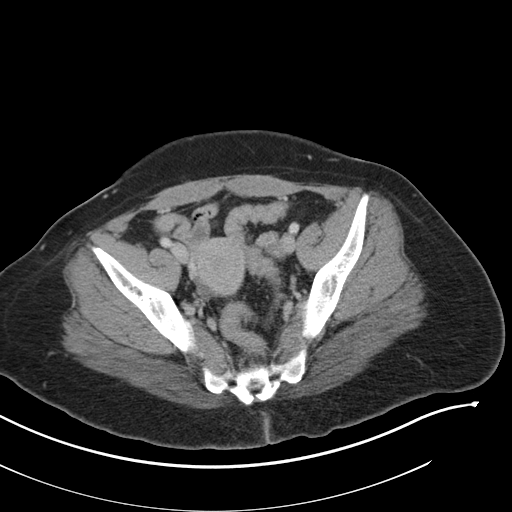
[im 33/93  soft-tissue]
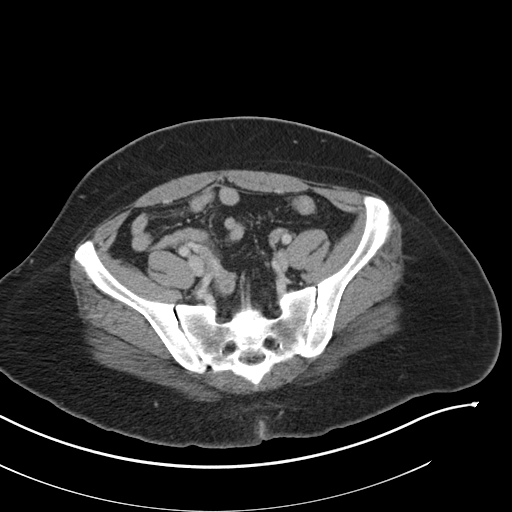
[im 42/93  soft-tissue]
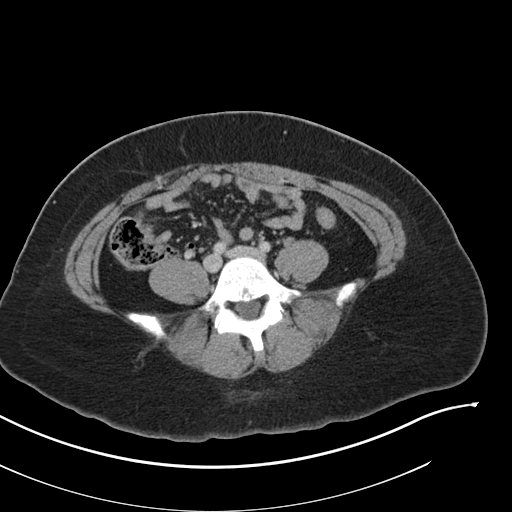
[im 47/93  soft-tissue]
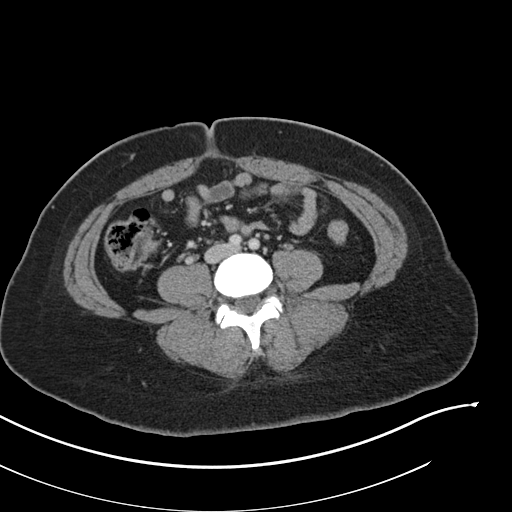
[im 51/93  soft-tissue]
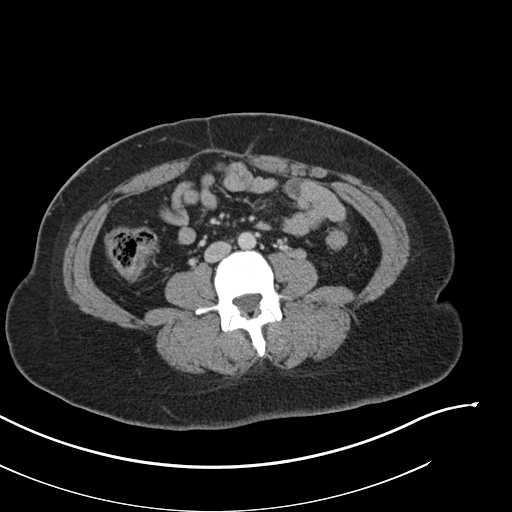
[im 60/93  soft-tissue]
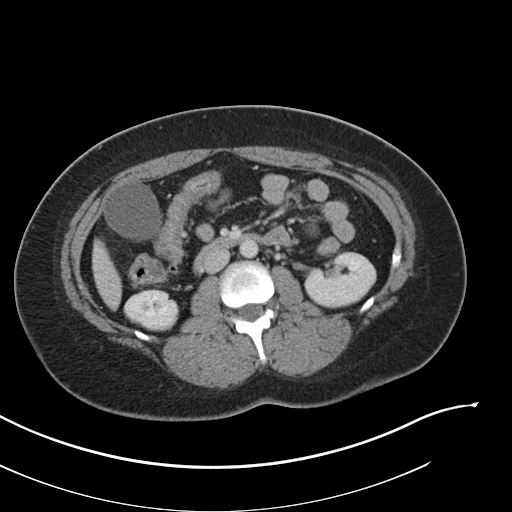
[im 60/93  bone]
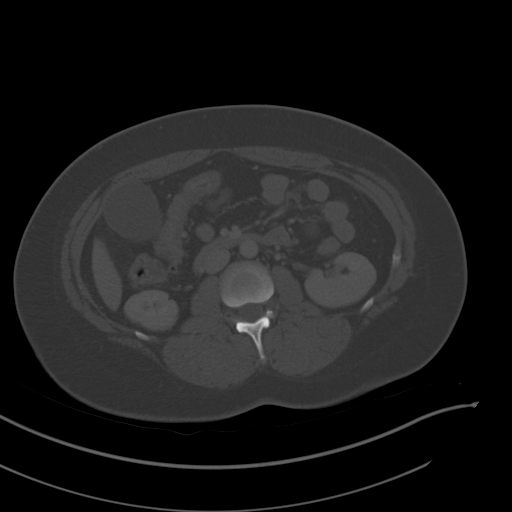
[im 65/93  soft-tissue]
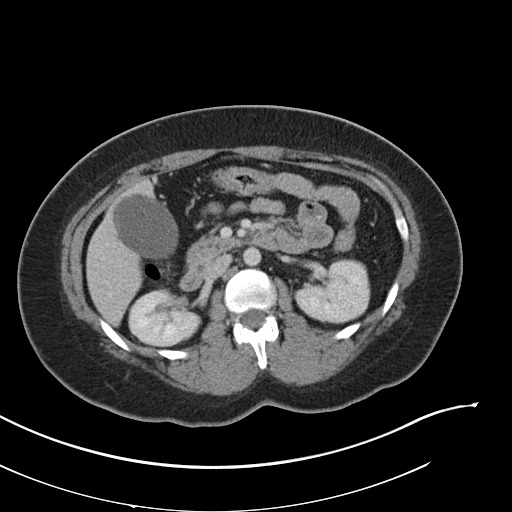
[im 74/93  soft-tissue]
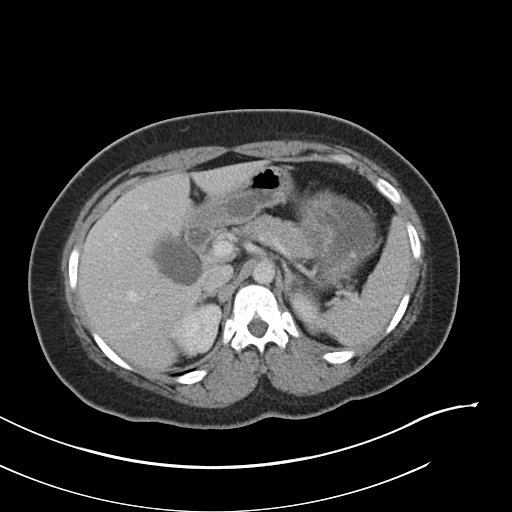
[im 79/93  soft-tissue]
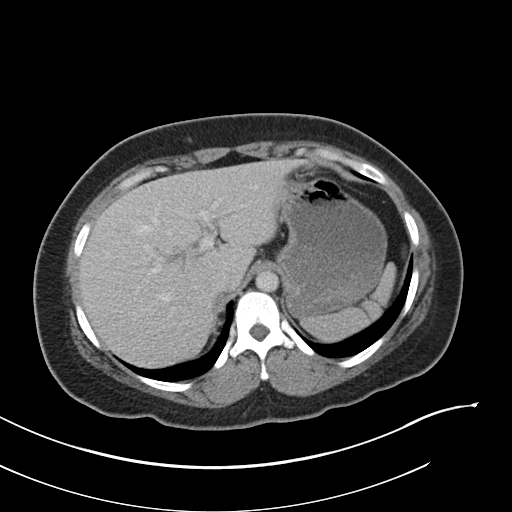
[im 88/93  soft-tissue]
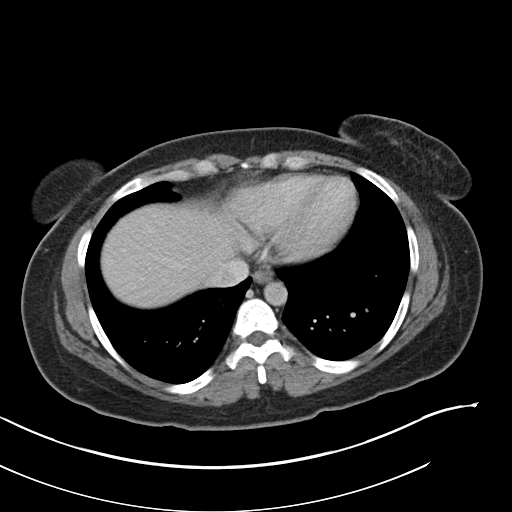

[Series 4: coronal st · coronal · 0.74mm/px · 3 of 141 slices shown]
[im 47/141  soft-tissue]
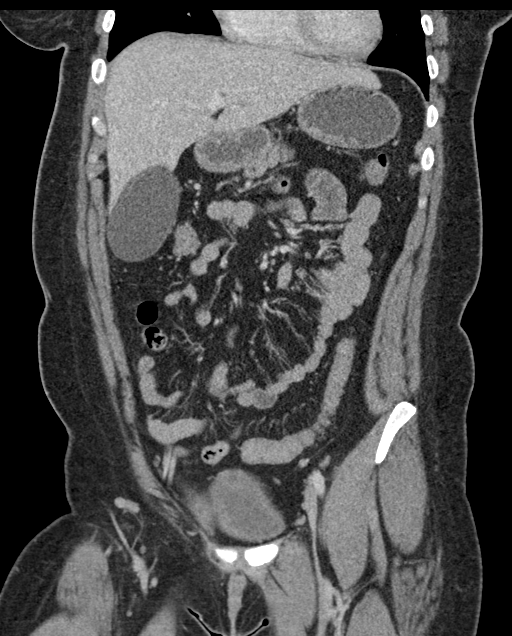
[im 63/141  soft-tissue]
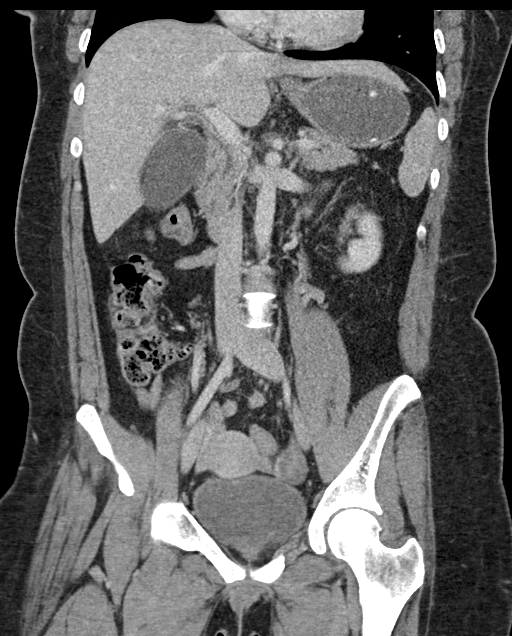
[im 78/141  soft-tissue]
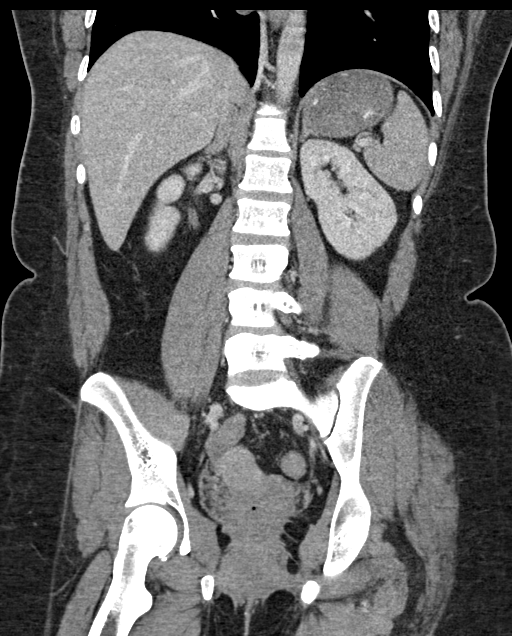

[16 of 46 positions shown; findings below may reference images not displayed]

FINDINGS: Lower chest: Unremarkable.

Hepatobiliary: No suspicious cystic or solid hepatic lesions. No
intra or extrahepatic biliary ductal dilatation. Gallbladder is
unremarkable in appearance.

Pancreas: No pancreatic mass. No pancreatic ductal dilatation. No
pancreatic or peripancreatic fluid collections or inflammatory
changes.

Spleen: Unremarkable.

Adrenals/Urinary Tract: Bilateral kidneys and bilateral adrenal
glands are normal in appearance. No hydroureteronephrosis. Urinary
bladder is normal in appearance.

Stomach/Bowel: Normal appearance of the stomach. No pathologic
dilatation of small bowel or colon. Normal appendix.

Vascular/Lymphatic: No significant atherosclerotic disease, aneurysm
or dissection identified in the visualized abdominal vasculature. No
lymphadenopathy noted in the abdomen or pelvis.

Reproductive: Uterus and ovaries are unremarkable in appearance.

Other: No significant volume of ascites.  No pneumoperitoneum.

Musculoskeletal: There are no aggressive appearing lytic or blastic
lesions noted in the visualized portions of the skeleton.
IMPRESSION: 1. No acute findings are noted in the abdomen or pelvis to account
for the patient's symptoms.

## 2022-04-23 ENCOUNTER — Telehealth: Payer: Self-pay | Admitting: Nurse Practitioner

## 2022-04-23 NOTE — Telephone Encounter (Signed)
Pt called and stated she is trying to get pregnant and have questions about certain meds she is on

## 2022-04-23 NOTE — Telephone Encounter (Signed)
Left message to return call.  

## 2022-04-24 NOTE — Telephone Encounter (Signed)
Made patient aware of Charlotte's instructions.

## 2022-04-24 NOTE — Telephone Encounter (Signed)
Pt returning call , please call back  °

## 2022-04-24 NOTE — Telephone Encounter (Signed)
Amy [name] is trying to get pregnant and wanted to know how to taper off Zoloft and Wellbutrin?

## 2022-04-26 ENCOUNTER — Emergency Department (HOSPITAL_COMMUNITY)
Admission: EM | Admit: 2022-04-26 | Discharge: 2022-04-26 | Disposition: A | Discharge status: Home / Self Care | Payer: 59 | Attending: Emergency Medicine | Admitting: Emergency Medicine

## 2022-04-26 ENCOUNTER — Other Ambulatory Visit (HOSPITAL_COMMUNITY): Payer: Self-pay

## 2022-04-26 ENCOUNTER — Encounter (HOSPITAL_COMMUNITY): Payer: Self-pay

## 2022-04-26 DIAGNOSIS — Y990 Civilian activity done for income or pay: Secondary | ICD-10-CM | POA: Diagnosis not present

## 2022-04-26 DIAGNOSIS — M549 Dorsalgia, unspecified: Secondary | ICD-10-CM | POA: Diagnosis present

## 2022-04-26 DIAGNOSIS — X500XXA Overexertion from strenuous movement or load, initial encounter: Secondary | ICD-10-CM | POA: Diagnosis not present

## 2022-04-26 DIAGNOSIS — M5416 Radiculopathy, lumbar region: Secondary | ICD-10-CM | POA: Diagnosis not present

## 2022-04-26 MED ORDER — KETOROLAC TROMETHAMINE 60 MG/2ML IM SOLN
60.0000 mg | Freq: Once | INTRAMUSCULAR | Status: AC
Start: 2022-04-26 — End: 2022-04-26
  Administered 2022-04-26: 60 mg via INTRAMUSCULAR
  Filled 2022-04-26: qty 2

## 2022-04-26 MED ORDER — DEXAMETHASONE SODIUM PHOSPHATE 10 MG/ML IJ SOLN
8.0000 mg | Freq: Once | INTRAMUSCULAR | Status: AC
  Administered 2022-04-26: 8 mg via INTRAMUSCULAR
  Filled 2022-04-26: qty 1

## 2022-04-26 MED ORDER — METHYLPREDNISOLONE 4 MG PO TBPK
ORAL_TABLET | ORAL | 0 refills | Status: DC
  Filled 2022-04-26: qty 21, 6d supply, fill #0

## 2022-04-26 MED ORDER — GABAPENTIN 300 MG PO CAPS
300.0000 mg | ORAL_CAPSULE | Freq: Three times a day (TID) | ORAL | 0 refills | Status: DC | PRN
  Filled 2022-04-26: qty 21, 7d supply, fill #0

## 2022-04-26 NOTE — ED Provider Notes (Signed)
[facility] Provider Note   CSN: [id number] Arrival date & time: 04/26/22  1128     History  Chief Complaint  Patient presents with   Back Pain   Leg Pain    Amy [name] is a 37 y.o. female presenting to the ED with back pain.  Patient reports she has been struggling with back spasms and back pain for approximately 3 weeks ever since she was lifting something heavy at work and felt her back "twinge".  She was seen at the orthopedic clinic and started on muscle relaxers, which she has been taking with ibuprofen.  She was concerned today because she was on the bathroom and had difficulty standing up, reporting that her back had "completely locked" and she can get off the toilet.  She was noting that she was having pain radiating down both of her legs to her knees.  This symptom is new.  Since arriving in the ED she has been able to ambulate to the bathroom.  She denies incontinence issues.  She reports she had problems with her back during her pregnancy but denies any history of spinal surgery.  HPI     Home Medications Prior to Admission medications   Medication Sig Start Date End Date Taking? Authorizing Provider  gabapentin (NEURONTIN) 300 MG capsule Take 1 capsule (300 mg total) by mouth 3 (three) times daily as needed for up to 21 doses. 04/26/22  Yes Wyvonnia Dusky, MD  methylPREDNISolone (MEDROL DOSEPAK) 4 MG TBPK tablet Use as directed on package 04/27/22  Yes Mayetta Castleman, Carola Rhine, MD  buPROPion [facility] SR) 150 MG 12 hr tablet TAKE 1 TABLET BY MOUTH TWICE A DAY 01/24/22   Nche, Charlene Brooke, NP  fluconazole (DIFLUCAN) 150 MG tablet Take 1 tablet PO once. Repeat in 3 days if needed. 03/15/22   Brunetta Jeans, PA-C  hydrOXYzine (VISTARIL) 25 MG capsule TAKE 1 CAPSULE (25 MG TOTAL) BY MOUTH DAILY AS NEEDED. 04/06/22   Merian Capron, MD  pantoprazole (PROTONIX) 40 MG tablet TAKE 1 TABLET BY MOUTH EVERY DAY *90 DAY SUPPLY REQUIRED  01/15/22   Nche, Charlene Brooke, NP  penicillin v potassium (VEETID) 500 MG tablet Take 500 mg by mouth 4 (four) times daily. 02/28/22   [provider]  sertraline (ZOLOFT) 50 MG tablet Take 1 tablet (50 mg total) by mouth daily. 03/12/22   Merian Capron, MD  sodium chloride (OCEAN) 0.65 % SOLN nasal spray Place 1 spray into both nostrils as needed for congestion. 02/23/22   Nche, Charlene Brooke, NP  Tretinoin 0.05 % LOTN Apply topically. Patient not taking: Reported on 01/02/2022    [provider]  valACYclovir (VALTREX) 500 MG tablet TAKE 1 TABLET (500 MG TOTAL) BY MOUTH DAILY. 10/17/21   Nche, Charlene Brooke, NP      Allergies    Patient has no known allergies.    Review of Systems   Review of Systems  Physical Exam Updated Vital Signs BP 108/86 (BP Location: Right Arm)   Pulse (!) 120   Temp 98.3 F (36.8 C) (Oral)   Resp 20   SpO2 100%  Physical Exam Constitutional:      General: She is not in acute distress. HENT:     Head: Normocephalic and atraumatic.  Eyes:     Conjunctiva/sclera: Conjunctivae normal.     Pupils: Pupils are equal, round, and reactive to light.  Cardiovascular:     Rate and Rhythm: Normal rate  and regular rhythm.  Pulmonary:     Effort: Pulmonary effort is normal. No respiratory distress.  Skin:    General: Skin is warm and dry.  Neurological:     General: No focal deficit present.     Mental Status: She is alert and oriented to person, place, and time. Mental status is at baseline.     Comments: No saddle anesthesia, patient was able to ambulate steadily in the ED.  Positive straight leg test bilaterally  Psychiatric:        Mood and Affect: Mood normal.        Behavior: Behavior normal.     ED Results / Procedures / Treatments   Labs (all labs ordered are listed, but only abnormal results are displayed) Labs Reviewed - No data to display  EKG None  Radiology No results found.  Procedures Procedures    Medications  Ordered in ED Medications  dexamethasone (DECADRON) injection 8 mg (has no administration in time range)  ketorolac (TORADOL) injection 60 mg (has no administration in time range)    ED Course/ Medical Decision Making/ A&P                             Medical Decision Making Risk Prescription drug management.   Patient is here with bilateral radiculopathy versus sciatica of the lower extremities complaining of her back "locking up" on the toilet earlier today.  I suspect her symptoms may be related to a disc herniation of the lower back which is causing some radiculopathy, on the right side greater than the left, and likely there is some subsequent paraspinal muscle spasm component.  She does not demonstrate red flags for cauda equina syndrome, but we did have this discussion in regards to return precautions.  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  We will start her on a steroid Dosepak as well as gabapentin.  She will follow-up with the orthopedic clinic.  She verbalized understanding        Final Clinical Impression(s) / ED Diagnoses Final diagnoses:  Lumbar radiculopathy    Rx / DC Orders ED Discharge Orders          Ordered    methylPREDNISolone (MEDROL DOSEPAK) 4 MG TBPK tablet        04/26/22 1221    gabapentin (NEURONTIN) 300 MG capsule  3 times daily PRN        04/26/22 1221              Wyvonnia Dusky, MD 04/26/22 1221

## 2022-04-26 NOTE — ED Triage Notes (Signed)
Pt presents with c/o back pain that started several weeks ago. Pt reports that today, the pain started to go down both of her legs and she was unable to stand off of the toilet. Pt reports she has been seen for the back pain and diagnosed with a muscle strain.

## 2022-04-26 NOTE — Discharge Instructions (Addendum)
Please follow-up with the orthopedic clinic

## 2022-04-30 ENCOUNTER — Other Ambulatory Visit (HOSPITAL_COMMUNITY): Payer: Self-pay

## 2022-04-30 DIAGNOSIS — M545 Low back pain, unspecified: Secondary | ICD-10-CM | POA: Diagnosis not present

## 2022-05-03 ENCOUNTER — Other Ambulatory Visit (HOSPITAL_COMMUNITY): Payer: Self-pay

## 2022-05-07 ENCOUNTER — Encounter (HOSPITAL_COMMUNITY): Payer: Self-pay | Admitting: Psychiatry

## 2022-05-07 ENCOUNTER — Other Ambulatory Visit (HOSPITAL_COMMUNITY): Payer: Self-pay

## 2022-05-07 ENCOUNTER — Telehealth (INDEPENDENT_AMBULATORY_CARE_PROVIDER_SITE_OTHER): Payer: 59 | Admitting: Psychiatry

## 2022-05-07 DIAGNOSIS — F339 Major depressive disorder, recurrent, unspecified: Secondary | ICD-10-CM

## 2022-05-07 DIAGNOSIS — F411 Generalized anxiety disorder: Secondary | ICD-10-CM | POA: Diagnosis not present

## 2022-05-07 DIAGNOSIS — M5451 Vertebrogenic low back pain: Secondary | ICD-10-CM | POA: Diagnosis not present

## 2022-05-07 MED ORDER — SERTRALINE HCL 50 MG PO TABS
50.0000 mg | ORAL_TABLET | Freq: Every day | ORAL | 0 refills | Status: DC
  Filled 2022-05-07 – 2022-06-29 (×3): qty 90, 90d supply, fill #0

## 2022-05-07 NOTE — Progress Notes (Signed)
[place] Follow up visit  Patient Identification: Amy [name] MRN:  [id number] Date of Evaluation:  05/07/2022 Referral Source: primary care Chief Complaint:   No chief complaint on file. Follow up depression, anxiety Visit Diagnosis:    ICD-10-CM   1. Depression, recurrent ([place])  F33.9     2. GAD (generalized anxiety disorder)  F41.1     Virtual Visit via Video Note  I connected with Amy [name] on 05/07/22 at  2:00 PM EDT by a video enabled telemedicine application and verified that I am speaking with the correct person using two identifiers.  Location: Patient: home Provider: home office   I discussed the limitations of evaluation and management by telemedicine and the availability of in person appointments. The patient expressed understanding and agreed to proceed.      I discussed the assessment and treatment plan with the patient. The patient was provided an opportunity to ask questions and all were answered. The patient agreed with the plan and demonstrated an understanding of the instructions.   The patient was advised to call back or seek an in-person evaluation if the symptoms worsen or if the condition fails to improve as anticipated.  I provided 15  minutes of non-face-to-face time during this encounter.      History of Present Illness: Patient is a 37 years old currently married African-American female initially referred by primary care physician office to establish care for depression, anxiety.    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    Consequences of Substance  Abuse: Discussed effect of alcohol on depression, judjement  Past Medical History:  Past Medical History:  Diagnosis Date   Anxiety    with flying only   GERD (gastroesophageal reflux disease)    Herpes    Vaginal Pap smear, abnormal     Past Surgical History:  Procedure Laterality Date   CERVICAL BIOPSY  W/ LOOP ELECTRODE EXCISION     CESAREAN SECTION N/A 07/20/2018   Procedure: CESAREAN SECTION;  Surgeon: Sanjuana Kava, MD;  Location: [facility] ORS;  Service: Obstetrics;  Laterality: N/A;   WISDOM TOOTH EXTRACTION      Family Psychiatric History: sister depression Aunt schizophrenia  Family History:  Family History  Problem Relation Age of Onset   Hypertension Mother    Hyperlipidemia Mother    Hyperlipidemia Father    Alcohol abuse Father    Cirrhosis Father    Hypertension Maternal Aunt    Heart disease Paternal Grandmother     Social History:   Social History   Socioeconomic History   Marital status: Married    Spouse name: Not on file   Number of children: 1   Years of education: Not on file   Highest education level: Not on file  Occupational History   Occupation: Programmer, multimedia: [employer]    Comment: GI/endo at [employer]  Tobacco Use   Smoking status: Never   Smokeless tobacco: Never  Vaping Use   Vaping Use: Never used  Substance and Sexual Activity   Alcohol use: Yes    Alcohol/week: 7.0 standard drinks of alcohol  Types: 6 Glasses of wine, 1 Shots of liquor per week   Drug use: No   Sexual activity: Yes    Birth control/protection: Condom  Other Topics Concern   Not on file  Social History Narrative   Not on file   Social Determinants of Health   Financial Resource Strain: Low Risk  (07/08/2018)   Overall Financial Resource Strain (CARDIA)    Difficulty of Paying Living Expenses: Not hard at all  Food Insecurity: No Food Insecurity (07/08/2018)   Hunger Vital Sign    Worried About Running Out of Food in the Last Year: Never true    Sun in the Last Year: Never true  Transportation Needs: Unknown (07/08/2018)   PRAPARE - Hydrologist (Medical): No    Lack of Transportation (Non-Medical): Not on file  Physical Activity: Not on file  Stress: No Stress Concern Present (07/08/2018)   [place]    Feeling of Stress : Only a little  Social Connections: Not on file    Additional Social History: grew up with parents, dad would yell a lot, he was alcoholic   Allergies:  No Known Allergies  Metabolic Disorder Labs: Lab Results  Component Value Date   HGBA1C 5.5 10/19/2021   MPG 105 02/28/2017   No results found for: "PROLACTIN" Lab Results  Component Value Date   CHOL 187 10/19/2021   TRIG 42.0 10/19/2021   HDL 62.70 10/19/2021   CHOLHDL 3 10/19/2021   VLDL 8.4 10/19/2021   LDLCALC 116 (H) 10/19/2021   LDLCALC 128 (H) 02/03/2021   Lab Results  Component Value Date   TSH 2.07 02/03/2021    Therapeutic Level Labs: No results found for: "LITHIUM" No results found for: "CBMZ" No results found for: "VALPROATE"  Current Medications: Current Outpatient Medications  Medication Sig Dispense Refill   buPROPion (WELLBUTRIN SR) 150 MG 12 hr tablet TAKE 1 TABLET BY MOUTH TWICE A DAY 180 tablet 2   fluconazole (DIFLUCAN) 150 MG tablet Take 1 tablet PO once. Repeat in 3 days if needed. 2 tablet 0   gabapentin (NEURONTIN) 300 MG capsule Take 1 capsule (300 mg total) by mouth 3 (three) times daily as needed for up to 21 doses. 21 capsule 0   hydrOXYzine (VISTARIL) 25 MG capsule TAKE 1 CAPSULE (25 MG TOTAL) BY MOUTH DAILY AS NEEDED. 90 capsule 0   methylPREDNISolone (MEDROL DOSEPAK) 4 MG TBPK tablet Use as directed on package 21 tablet 0   pantoprazole (PROTONIX) 40 MG tablet TAKE 1 TABLET BY MOUTH EVERY DAY *90 DAY SUPPLY REQUIRED 90 tablet 1   penicillin v potassium (VEETID) 500 MG tablet Take 500 mg by mouth 4 (four) times  daily.     sertraline (ZOLOFT) 50 MG tablet Take 1 tablet (50 mg total) by mouth daily. 50 tablet 0   sodium chloride (OCEAN) 0.65 % SOLN nasal spray Place 1 spray into both nostrils as needed for congestion. 15 mL 0   Tretinoin 0.05 % LOTN Apply topically. (Patient not taking: Reported on 01/02/2022)     valACYclovir (VALTREX) 500 MG tablet TAKE 1 TABLET (500 MG TOTAL) BY MOUTH DAILY. 90 tablet 1   No current facility-administered medications for this visit.     Psychiatric Specialty Exam: Review of Systems  Cardiovascular:  Negative for chest pain.  Skin:  Negative for rash.  Psychiatric/Behavioral:  Negative for agitation.     There were  no vitals taken for this visit.There is no height or weight on file to calculate BMI.  General Appearance: Casual  Eye Contact:  Fair  Speech:  Slow  Volume:  Decreased  Mood: better  Affect:  Constricted  Thought Process:  Goal Directed  Orientation:  Full (Time, Place, and Person)  Thought Content:  Rumination  Suicidal Thoughts:  No  Homicidal Thoughts:  No  Memory:  Immediate;   Fair  Judgement:  Fair  Insight:  Fair  Psychomotor Activity:  Decreased  Concentration:  Concentration: Fair  Recall:  [place] of Knowledge:Good  Language: Good  Akathisia:  No  Handed:    AIMS (if indicated):  not done  Assets:  Desire for Improvement Financial Resources/Insurance Housing  ADL's:  Intact  Cognition: WNL  Sleep:  Fair   Screenings: GAD-7    Personnel officer Visit from 02/23/2022 in [place] at [facility] Visit from 01/02/2022 in [place] at [facility] Visit from 10/19/2021 in [place] at [facility] Visit from 06/14/2021 in [place] at [facility] Visit from 04/07/2021 in [place] at [facility]  Total GAD-7 Score 11 12 3 6 1       PHQ2-9    [place]  Office Visit from 02/23/2022 in [place] at [facility] Video Visit from 01/27/2022 in [facility] Office Visit from 01/02/2022 in [place] at [facility] Visit from 10/19/2021 in [place] at [facility] Visit from 06/14/2021 in [place] at [facility]  PHQ-2 Total Score 2 2 2  0 1  PHQ-9 Total Score 9 10 11 1 4       Flowsheet Row ED from 04/26/2022 in [facility] Emergency Department at [facility] Video Visit from 01/27/2022 in [facility] ED from 02/18/2021 in [facility] Emergency Department at [place] No Risk No Risk No Risk       Assessment and Plan: as follows   Prior documentation reviewed  MDD recurrent moderate:  better continue zoloft. Continue wellbutrin GAD: improved coninue zoloft    Alcohol use : not drinking, doing better, sporadic use.   Also discussed to organize the days and have more activities available to plan  Follow-up in 24m.  Collaboration of Care: Primary Care Provider AEB reviewed notes and referral  Patient/Guardian was advised Release of Information must be obtained prior to any record release in order to collaborate their care with an outside provider. Patient/Guardian was advised if they have not already done so to contact the registration department to sign all necessary forms in order for [place] to release information regarding their care.   Consent: Patient/Guardian gives verbal consent for treatment and assignment of benefits for services provided during this visit. Patient/Guardian expressed understanding and agreed to proceed.   Merian Capron, MD 3/18/20242:02 PM

## 2022-05-17 ENCOUNTER — Other Ambulatory Visit (HOSPITAL_COMMUNITY): Payer: Self-pay

## 2022-05-17 ENCOUNTER — Ambulatory Visit
Admission: EM | Admit: 2022-05-17 | Discharge: 2022-05-17 | Disposition: A | Discharge status: Home / Self Care | Payer: 59 | Attending: Family Medicine | Admitting: Family Medicine

## 2022-05-17 DIAGNOSIS — H6691 Otitis media, unspecified, right ear: Secondary | ICD-10-CM

## 2022-05-17 DIAGNOSIS — J029 Acute pharyngitis, unspecified: Secondary | ICD-10-CM

## 2022-05-17 MED ORDER — AMOXICILLIN 875 MG PO TABS
875.0000 mg | ORAL_TABLET | Freq: Two times a day (BID) | ORAL | 0 refills | Status: AC
Start: 2022-05-17 — End: 2022-05-27

## 2022-05-17 NOTE — ED Provider Notes (Signed)
[facility]    CSN: [id number] Arrival date & time: 05/17/22  1942      History   Chief Complaint Chief Complaint  Patient presents with   Sore Throat   Otalgia    HPI Amy [name] is a 37 y.o. female.    Sore Throat  Otalgia Here for sore throat and right ear pain.  Symptoms began yesterday morning.  No nausea vomiting or diarrhea.  She has maybe had a little congestion.  Last menstrual cycle was March 5, but she feels she might be pregnant.  She has not missed a period yet  Past Medical History:  Diagnosis Date   Anxiety    with flying only   GERD (gastroesophageal reflux disease)    Herpes    Vaginal Pap smear, abnormal     Patient Active Problem List   Diagnosis Date Noted   Post-inflammatory hyperpigmentation 11/18/2020   Gastroesophageal reflux disease 06/22/2020   Genital herpes simplex 06/22/2020   Acanthosis nigricans 01/27/2020   Obesity 01/27/2020   GAD (generalized anxiety disorder) 11/06/2019   Mixed hyperlipidemia 12/07/2018   Vitamin D deficiency 03/04/2017   Prediabetes 05/31/2016   Insomnia 11/04/2015   Depression, recurrent ([place]) 11/04/2015    Past Surgical History:  Procedure Laterality Date   CERVICAL BIOPSY  W/ LOOP ELECTRODE EXCISION     CESAREAN SECTION N/A 07/20/2018   Procedure: CESAREAN SECTION;  Surgeon: Sanjuana Kava, MD;  Location: [facility];  Service: Obstetrics;  Laterality: N/A;   WISDOM TOOTH EXTRACTION      OB History     Gravida  1   Para  1   Term  1   Preterm      AB      Living  1      SAB      IAB      Ectopic      Multiple  0   Live Births  1            Home Medications    Prior to Admission medications   Medication Sig Start Date End Date Taking? Authorizing Provider  amoxicillin (AMOXIL) 875 MG tablet Take 1 tablet (875 mg total) by mouth 2 (two) times daily for 10 days. 05/17/22 05/27/22 Yes Barrett Henle, MD  buPROPion [facility] SR) 150 MG 12 hr tablet TAKE 1  TABLET BY MOUTH TWICE A DAY 01/24/22   Nche, Charlene Brooke, NP  gabapentin (NEURONTIN) 300 MG capsule Take 1 capsule (300 mg total) by mouth 3 (three) times daily as needed for up to 21 doses. 04/26/22   Wyvonnia Dusky, MD  hydrOXYzine (VISTARIL) 25 MG capsule TAKE 1 CAPSULE (25 MG TOTAL) BY MOUTH DAILY AS NEEDED. 04/06/22   Merian Capron, MD  pantoprazole (PROTONIX) 40 MG tablet TAKE 1 TABLET BY MOUTH EVERY DAY *90 DAY SUPPLY REQUIRED 01/15/22   Nche, Charlene Brooke, NP  sertraline (ZOLOFT) 50 MG tablet Take 1 tablet (50 mg total) by mouth daily. 05/07/22   Merian Capron, MD  sodium chloride (OCEAN) 0.65 % SOLN nasal spray Place 1 spray into both nostrils as needed for congestion. 02/23/22   Nche, Charlene Brooke, NP  Tretinoin 0.05 % LOTN Apply topically. Patient not taking: Reported on 01/02/2022    [provider]  valACYclovir (VALTREX) 500 MG tablet TAKE 1 TABLET (500 MG TOTAL) BY MOUTH DAILY. 10/17/21   Nche, Charlene Brooke, NP    Family History Family History  Problem Relation Age of Onset  Hypertension Mother    Hyperlipidemia Mother    Hyperlipidemia Father    Alcohol abuse Father    Cirrhosis Father    Hypertension Maternal Aunt    Heart disease Paternal Grandmother     Social History Social History   Tobacco Use   Smoking status: Never   Smokeless tobacco: Never  Vaping Use   Vaping Use: Never used  Substance Use Topics   Alcohol use: Yes    Alcohol/week: 7.0 standard drinks of alcohol    Types: 6 Glasses of wine, 1 Shots of liquor per week   Drug use: No     Allergies   Patient has no known allergies.   Review of Systems Review of Systems  HENT:  Positive for ear pain.      Physical Exam Triage Vital Signs ED Triage Vitals  Enc Vitals Group     BP 05/17/22 1949 112/79     Pulse Rate 05/17/22 1949 (!) 105     Resp 05/17/22 1949 18     Temp 05/17/22 1949 98.4 F (36.9 C)     Temp Source 05/17/22 1949 Oral     SpO2 05/17/22 1949 96 %     Weight  --      Height --      Head Circumference --      Peak Flow --      Pain Score 05/17/22 1948 3     Pain Loc --      Pain Edu? --      Excl. in [place]? --    No data found.  Updated Vital Signs BP 112/79 (BP Location: Left Arm)   Pulse (!) 105   Temp 98.4 F (36.9 C) (Oral)   Resp 18   LMP 04/24/2022   SpO2 96%   Visual Acuity Right Eye Distance:   Left Eye Distance:   Bilateral Distance:    Right Eye Near:   Left Eye Near:    Bilateral Near:     Physical Exam Vitals reviewed.  Constitutional:      General: She is not in acute distress.    Appearance: She is not toxic-appearing.  HENT:     Right Ear: Ear canal normal.     Left Ear: Tympanic membrane and ear canal normal.     Ears:     Comments: Right tympanic membrane is pink and dull with altered landmarks    Nose: Nose normal.     Mouth/Throat:     Mouth: Mucous membranes are moist.     Comments: Oropharynx has some erythema in the posterior part Eyes:     Extraocular Movements: Extraocular movements intact.     Conjunctiva/sclera: Conjunctivae normal.     Pupils: Pupils are equal, round, and reactive to light.  Cardiovascular:     Rate and Rhythm: Normal rate and regular rhythm.     Heart sounds: No murmur heard. Pulmonary:     Effort: Pulmonary effort is normal. No respiratory distress.     Breath sounds: No stridor. No wheezing, rhonchi or rales.  Musculoskeletal:     Cervical back: Neck supple.  Lymphadenopathy:     Cervical: No cervical adenopathy.  Skin:    Capillary Refill: Capillary refill takes less than 2 seconds.     Coloration: Skin is not jaundiced or pale.  Neurological:     General: No focal deficit present.     Mental Status: She is alert and oriented to person, place, and time.  Psychiatric:  Behavior: Behavior normal.      UC Treatments / Results  Labs (all labs ordered are listed, but only abnormal results are displayed) Labs Reviewed - No data to  display  EKG   Radiology No results found.  Procedures Procedures (including critical care time)  Medications Ordered in UC Medications - No data to display  Initial Impression / Assessment and Plan / UC Course  I have reviewed the triage vital signs and the nursing notes.  Pertinent labs & imaging results that were available during my care of the patient were reviewed by me and considered in my medical decision making (see chart for details).        Amoxicillin is sent in to treat the otitis media.  I went ahead and treated for 10 days since she had the acute sore throat.  We discussed that if it does turn out that she is pregnant, Tylenol is the safest thing to take for pain or fever.  It is too early to do a urine pregnancy test here in clinic, as she is not missed her cycle yet. Final Clinical Impressions(s) / UC Diagnoses   Final diagnoses:  Right otitis media, unspecified otitis media type  Acute pharyngitis, unspecified etiology     Discharge Instructions      Take amoxicillin 875 mg--1 tab twice daily for 10 days  Tylenol as needed for pain or fever; make sure you are drinking enough fluids      ED Prescriptions     Medication Sig Dispense Auth. Provider   amoxicillin (AMOXIL) 875 MG tablet Take 1 tablet (875 mg total) by mouth 2 (two) times daily for 10 days. 20 tablet Samul Mcinroy, Gwenlyn Perking, MD      PDMP not reviewed this encounter.   Barrett Henle, MD 05/17/22 2003

## 2022-05-17 NOTE — Discharge Instructions (Signed)
Take amoxicillin 875 mg--1 tab twice daily for 10 days  Tylenol as needed for pain or fever; make sure you are drinking enough fluids

## 2022-05-17 NOTE — ED Triage Notes (Signed)
Pt c/o sore throat, and right ear pain that began about 2 days ago.   Home interventions: tylenol

## 2022-05-31 ENCOUNTER — Other Ambulatory Visit: Payer: Self-pay | Admitting: Nurse Practitioner

## 2022-05-31 ENCOUNTER — Other Ambulatory Visit: Payer: Self-pay

## 2022-05-31 ENCOUNTER — Other Ambulatory Visit (HOSPITAL_COMMUNITY): Payer: Self-pay

## 2022-05-31 DIAGNOSIS — F331 Major depressive disorder, recurrent, moderate: Secondary | ICD-10-CM

## 2022-05-31 MED ORDER — BUPROPION HCL ER (SR) 150 MG PO TB12
150.0000 mg | ORAL_TABLET | Freq: Two times a day (BID) | ORAL | 2 refills | Status: AC
Start: 2022-05-31 — End: ?
  Filled 2022-05-31 – 2022-06-25 (×2): qty 180, 90d supply, fill #0
  Filled 2022-11-14: qty 60, 30d supply, fill #1

## 2022-06-09 ENCOUNTER — Other Ambulatory Visit: Payer: Self-pay | Admitting: Nurse Practitioner

## 2022-06-11 ENCOUNTER — Other Ambulatory Visit (HOSPITAL_COMMUNITY): Payer: Self-pay

## 2022-06-25 ENCOUNTER — Encounter: Payer: Self-pay | Admitting: Nurse Practitioner

## 2022-06-25 ENCOUNTER — Other Ambulatory Visit: Payer: Self-pay

## 2022-06-25 ENCOUNTER — Other Ambulatory Visit (HOSPITAL_COMMUNITY): Payer: Self-pay

## 2022-06-25 ENCOUNTER — Ambulatory Visit (INDEPENDENT_AMBULATORY_CARE_PROVIDER_SITE_OTHER): Payer: 59 | Admitting: Nurse Practitioner

## 2022-06-25 VITALS — BP 101/80 | HR 87 | Temp 98.2°F | Resp 16 | Ht 64.5 in | Wt 226.6 lb

## 2022-06-25 DIAGNOSIS — F40243 Fear of flying: Secondary | ICD-10-CM

## 2022-06-25 DIAGNOSIS — Z6838 Body mass index (BMI) 38.0-38.9, adult: Secondary | ICD-10-CM | POA: Diagnosis not present

## 2022-06-25 DIAGNOSIS — F331 Major depressive disorder, recurrent, moderate: Secondary | ICD-10-CM | POA: Insufficient documentation

## 2022-06-25 MED ORDER — LORAZEPAM 1 MG PO TABS
1.0000 mg | ORAL_TABLET | Freq: Two times a day (BID) | ORAL | 0 refills | Status: DC | PRN
Start: 2022-06-25 — End: 2022-07-09
  Filled 2022-06-25: qty 6, 2d supply, fill #0

## 2022-06-25 NOTE — Assessment & Plan Note (Addendum)
26lbs Weight gain noted She admits to lack of exercise and high calorie diet. Stress eating with new job. Under the care of psychiatry, current use of wellbutrin and zoloft. She stopped sessions with therapist due change in insurance. Wt Readings from Last 3 Encounters:  06/25/22 226 lb 9.6 oz (102.8 kg)  02/23/22 204 lb 3.2 oz (92.6 kg)  01/02/22 192 lb (87.1 kg)    Advised to resume daily exercise, meal prep, and resume sessions with therapist. F/up in 70month

## 2022-06-25 NOTE — Progress Notes (Signed)
Established Patient Visit  Patient: Amy [name]   DOB: 28-Jan-1986   37 y.o. Female  MRN: [id number] Visit Date: 06/25/2022  Subjective:    Chief Complaint  Patient presents with   Medication Refill    Xanax for upcoming flight    HPI She planes to travel to [place] this weekend. Previous use of alprazolam and diazepam with no improvement.  Obesity 26lbs Weight gain noted She admits to lack of exercise and high calorie diet. Stress eating with new job. Under the care of psychiatry, current use of wellbutrin and zoloft. She stopped sessions with therapist due change in insurance. Wt Readings from Last 3 Encounters:  06/25/22 226 lb 9.6 oz (102.8 kg)  02/23/22 204 lb 3.2 oz (92.6 kg)  01/02/22 192 lb (87.1 kg)    Advised to resume daily exercise, meal prep, and resume sessions with therapist. F/up in 3months  Wt Readings from Last 3 Encounters:  06/25/22 226 lb 9.6 oz (102.8 kg)  02/23/22 204 lb 3.2 oz (92.6 kg)  01/02/22 192 lb (87.1 kg)    BP Readings from Last 3 Encounters:  06/25/22 101/80  05/17/22 112/79  04/26/22 108/86    Reviewed medical, surgical, and social history today  Medications: Outpatient Medications Prior to Visit  Medication Sig   buPROPion (WELLBUTRIN SR) 150 MG 12 hr tablet Take 1 tablet (150 mg total) by mouth 2 (two) times daily.   hydrOXYzine (VISTARIL) 25 MG capsule TAKE 1 CAPSULE (25 MG TOTAL) BY MOUTH DAILY AS NEEDED.   pantoprazole (PROTONIX) 40 MG tablet TAKE 1 TABLET BY MOUTH EVERY DAY *90 DAY SUPPLY REQUIRED   sertraline (ZOLOFT) 50 MG tablet Take 1 tablet (50 mg total) by mouth daily.   sodium chloride (OCEAN) 0.65 % SOLN nasal spray Place 1 spray into both nostrils as needed for congestion.   Tretinoin 0.05 % LOTN Apply topically.   valACYclovir (VALTREX) 500 MG tablet TAKE 1 TABLET (500 MG TOTAL) BY MOUTH DAILY.   [DISCONTINUED] ALPRAZolam (XANAX) 0.5 MG tablet TAKE 0.5-1 TABLETS BY MOUTH DAILY AS NEEDED FOR ANXIETY  ( PRIOR TO FLIGHT).   [DISCONTINUED] gabapentin (NEURONTIN) 300 MG capsule Take 1 capsule (300 mg total) by mouth 3 (three) times daily as needed for up to 21 doses.   No facility-administered medications prior to visit.   Reviewed past medical and social history.   ROS per HPI above      Objective:  BP 101/80 (BP Location: Right Arm, Patient Position: Sitting, Cuff Size: Large)   Pulse 87   Temp 98.2 F (36.8 C) (Temporal)   Resp 16   Ht 5' 4.5" (1.638 m)   Wt 226 lb 9.6 oz (102.8 kg)   LMP 06/16/2022 (Exact Date)   SpO2 97%   BMI 38.30 kg/m      Physical Exam Cardiovascular:     Rate and Rhythm: Normal rate and regular rhythm.     Pulses: Normal pulses.     Heart sounds: Normal heart sounds.  Pulmonary:     Effort: Pulmonary effort is normal.     Breath sounds: Normal breath sounds.  Neurological:     Mental Status: She is alert and oriented to person, place, and time.  Psychiatric:        Attention and Perception: Attention normal.        Mood and Affect: Mood is anxious.        Speech: Speech  normal.        Behavior: Behavior is cooperative.        Thought Content: Thought content normal.        Cognition and Memory: Cognition normal.     No results found for any visits on 06/25/22.    Assessment & Plan:    Problem List Items Addressed This Visit       Other   Obesity    26lbs Weight gain noted She admits to lack of exercise and high calorie diet. Stress eating with new job. Under the care of psychiatry, current use of wellbutrin and zoloft. She stopped sessions with therapist due change in insurance. Wt Readings from Last 3 Encounters:  06/25/22 226 lb 9.6 oz (102.8 kg)  02/23/22 204 lb 3.2 oz (92.6 kg)  01/02/22 192 lb (87.1 kg)    Advised to resume daily exercise, meal prep, and resume sessions with therapist. F/up in 3months      Other Visit Diagnoses     Fear of flying    -  Primary   Relevant Medications   LORazepam (ATIVAN) 1 MG  tablet   Moderate episode of recurrent major depressive disorder (HCC)   (Chronic)     Relevant Medications   LORazepam (ATIVAN) 1 MG tablet      Return in about 4 weeks (around 07/23/2022) for CPE (fasting).     Alysia Penna, NP

## 2022-06-27 ENCOUNTER — Encounter: Payer: Self-pay | Admitting: Nurse Practitioner

## 2022-06-27 DIAGNOSIS — F40243 Fear of flying: Secondary | ICD-10-CM

## 2022-06-29 ENCOUNTER — Other Ambulatory Visit (HOSPITAL_COMMUNITY): Payer: Self-pay

## 2022-07-06 ENCOUNTER — Other Ambulatory Visit (HOSPITAL_COMMUNITY): Payer: Self-pay

## 2022-07-06 MED FILL — Pantoprazole Sodium EC Tab 40 MG (Base Equiv): ORAL | 90 days supply | Qty: 90 | Fill #0 | Status: AC

## 2022-07-07 ENCOUNTER — Other Ambulatory Visit (HOSPITAL_COMMUNITY): Payer: Self-pay | Admitting: Psychiatry

## 2022-07-07 DIAGNOSIS — F411 Generalized anxiety disorder: Secondary | ICD-10-CM

## 2022-07-09 MED ORDER — LORAZEPAM 1 MG PO TABS
1.0000 mg | ORAL_TABLET | Freq: Two times a day (BID) | ORAL | 0 refills | Status: DC | PRN
Start: 2022-07-09 — End: 2022-09-05

## 2022-07-09 NOTE — Addendum Note (Signed)
Addended by: Michaela Corner on: 07/09/2022 01:45 PM   Modules accepted: Orders

## 2022-07-11 ENCOUNTER — Telehealth (HOSPITAL_COMMUNITY): Payer: 59 | Admitting: Psychiatry

## 2022-07-12 ENCOUNTER — Telehealth: Payer: Self-pay | Admitting: Nurse Practitioner

## 2022-07-12 ENCOUNTER — Ambulatory Visit: Payer: 59 | Admitting: Nurse Practitioner

## 2022-07-12 NOTE — Telephone Encounter (Signed)
Pt called at 8:06 to cancel she is feeling better. Letter sent

## 2022-07-13 NOTE — Telephone Encounter (Signed)
same day cancel/feeling better, 1st missed visit, letter sent

## 2022-08-09 DIAGNOSIS — F4323 Adjustment disorder with mixed anxiety and depressed mood: Secondary | ICD-10-CM | POA: Diagnosis not present

## 2022-08-13 ENCOUNTER — Ambulatory Visit (INDEPENDENT_AMBULATORY_CARE_PROVIDER_SITE_OTHER): Payer: 59 | Admitting: Nurse Practitioner

## 2022-08-13 ENCOUNTER — Encounter: Payer: Self-pay | Admitting: Nurse Practitioner

## 2022-08-13 VITALS — BP 112/80 | HR 84 | Temp 98.1°F | Resp 16 | Ht 64.5 in | Wt 231.4 lb

## 2022-08-13 DIAGNOSIS — E7800 Pure hypercholesterolemia, unspecified: Secondary | ICD-10-CM

## 2022-08-13 DIAGNOSIS — R7303 Prediabetes: Secondary | ICD-10-CM

## 2022-08-13 DIAGNOSIS — E559 Vitamin D deficiency, unspecified: Secondary | ICD-10-CM

## 2022-08-13 DIAGNOSIS — Z0001 Encounter for general adult medical examination with abnormal findings: Secondary | ICD-10-CM

## 2022-08-13 LAB — LIPID PANEL
Cholesterol: 217 mg/dL — ABNORMAL HIGH (ref 0–200)
HDL: 44.8 mg/dL (ref >39.00)
LDL Cholesterol: 152 mg/dL — ABNORMAL HIGH (ref 0–99)
NonHDL: 171.96
Total CHOL/HDL Ratio: 5
Triglycerides: 98 mg/dL (ref 0.0–149.0)
VLDL: 19.6 mg/dL (ref 0.0–40.0)

## 2022-08-13 LAB — COMPREHENSIVE METABOLIC PANEL
ALT: 17 U/L (ref 0–35)
AST: 15 U/L (ref 0–37)
Albumin: 4 g/dL (ref 3.5–5.2)
Alkaline Phosphatase: 63 U/L (ref 39–117)
BUN: 14 mg/dL (ref 6–23)
CO2: 26 mEq/L (ref 19–32)
Calcium: 9.1 mg/dL (ref 8.4–10.5)
Chloride: 103 mEq/L (ref 96–112)
Creatinine, Ser: 0.81 mg/dL (ref 0.40–1.20)
GFR: 94.21 mL/min (ref >60.00)
Glucose, Bld: 82 mg/dL (ref 70–99)
Potassium: 4.3 mEq/L (ref 3.5–5.1)
Sodium: 137 mEq/L (ref 135–145)
Total Bilirubin: 0.3 mg/dL (ref 0.2–1.2)
Total Protein: 7.5 g/dL (ref 6.0–8.3)

## 2022-08-13 LAB — HEMOGLOBIN A1C: Hgb A1c MFr Bld: 5.5 % (ref 4.6–6.5)

## 2022-08-13 NOTE — Patient Instructions (Signed)
Go to lab Continue Heart healthy diet and daily exercise. Maintain current medications.  Preventive Care 21-35 Years Old, Female Preventive care refers to lifestyle choices and visits with your health care provider that can promote health and wellness. Preventive care visits are also called wellness exams. What can I expect for my preventive care visit? Counseling During your preventive care visit, your health care provider may ask about your: Medical history, including: Past medical problems. Family medical history. Pregnancy history. Current health, including: Menstrual cycle. Method of birth control. Emotional well-being. Home life and relationship well-being. Sexual activity and sexual health. Lifestyle, including: Alcohol, nicotine or tobacco, and drug use. Access to firearms. Diet, exercise, and sleep habits. Work and work environment. Sunscreen use. Safety issues such as seatbelt and bike helmet use. Physical exam Your health care provider may check your: Height and weight. These may be used to calculate your BMI (body mass index). BMI is a measurement that tells if you are at a healthy weight. Waist circumference. This measures the distance around your waistline. This measurement also tells if you are at a healthy weight and may help predict your risk of certain diseases, such as type 2 diabetes and high blood pressure. Heart rate and blood pressure. Body temperature. Skin for abnormal spots. What immunizations do I need?  Vaccines are usually given at various ages, according to a schedule. Your health care provider will recommend vaccines for you based on your age, medical history, and lifestyle or other factors, such as travel or where you work. What tests do I need? Screening Your health care provider may recommend screening tests for certain conditions. This may include: Pelvic exam and Pap test. Lipid and cholesterol levels. Diabetes screening. This is done by  checking your blood sugar (glucose) after you have not eaten for a while (fasting). Hepatitis B test. Hepatitis C test. HIV (human immunodeficiency virus) test. STI (sexually transmitted infection) testing, if you are at risk. BRCA-related cancer screening. This may be done if you have a family history of breast, ovarian, tubal, or peritoneal cancers. Talk with your health care provider about your test results, treatment options, and if necessary, the need for more tests. Follow these instructions at home: Eating and drinking  Eat a healthy diet that includes fresh fruits and vegetables, whole grains, lean protein, and low-fat dairy products. Take vitamin and mineral supplements as recommended by your health care provider. Do not drink alcohol if: Your health care provider tells you not to drink. You are pregnant, may be pregnant, or are planning to become pregnant. If you drink alcohol: Limit how much you have to 0-1 drink a day. Know how much alcohol is in your drink. In the [place] one drink equals one 12 oz bottle of beer (355 mL), one 5 oz glass of wine (148 mL), or one 1 oz glass of hard liquor (44 mL). Lifestyle Brush your teeth every morning and night with fluoride toothpaste. Floss one time each day. Exercise for at least 30 minutes 5 or more days each week. Do not use any products that contain nicotine or tobacco. These products include cigarettes, chewing tobacco, and vaping devices, such as e-cigarettes. If you need help quitting, ask your health care provider. Do not use drugs. If you are sexually active, practice safe sex. Use a condom or other form of protection to prevent STIs. If you do not wish to become pregnant, use a form of birth control. If you plan to become pregnant, see your health care   provider for a prepregnancy visit. Find healthy ways to manage stress, such as: Meditation, yoga, or listening to music. Journaling. Talking to a trusted person. Spending time  with friends and family. Minimize exposure to UV radiation to reduce your risk of skin cancer. Safety Always wear your seat belt while driving or riding in a vehicle. Do not drive: If you have been drinking alcohol. Do not ride with someone who has been drinking. If you have been using any mind-altering substances or drugs. While texting. When you are tired or distracted. Wear a helmet and other protective equipment during sports activities. If you have firearms in your house, make sure you follow all gun safety procedures. Seek help if you have been physically or sexually abused. What's next? Go to your health care provider once a year for an annual wellness visit. Ask your health care provider how often you should have your eyes and teeth checked. Stay up to date on all vaccines. This information is not intended to replace advice given to you by your health care provider. Make sure you discuss any questions you have with your health care provider. Document Revised: 08/03/2020 Document Reviewed: 08/03/2020 Elsevier Patient Education  2024 [employer]  

## 2022-08-13 NOTE — Progress Notes (Signed)
Complete physical exam  Patient: Amy [name]   DOB: 03/19/1985   37 y.o. Female  MRN: [id number] Visit Date: 08/13/2022  Subjective:    Chief Complaint  Patient presents with   Annual Exam    Fasting & pap    Amy [name] is a 37 y.o. female who presents today for a complete physical exam. She reports consuming a general diet.  No exercise regimen  She generally feels well. She reports sleeping well. She does not have additional problems to discuss today.  Vision:No Dental:Yes STD Screen:No Will obtain PAP report from GYN.  BP Readings from Last 3 Encounters:  08/13/22 112/80  06/25/22 101/80  05/17/22 112/79   Wt Readings from Last 3 Encounters:  08/13/22 231 lb 6.4 oz (105 kg)  06/25/22 226 lb 9.6 oz (102.8 kg)  02/23/22 204 lb 3.2 oz (92.6 kg)   Most recent fall risk assessment:    08/02/2020    1:11 PM  Fall Risk   Falls in the past year? 0  Number falls in past yr: 0  Injury with Fall? 0   Depression screen:Yes - Depression  Most recent depression screenings:    08/13/2022   10:07 AM 02/23/2022   10:37 AM  PHQ 2/9 Scores  PHQ - 2 Score 1 2  PHQ- 9 Score 3 9   HPI  No problem-specific Assessment & Plan notes found for this encounter.   Past Medical History:  Diagnosis Date   Anxiety    with flying only   GERD (gastroesophageal reflux disease)    Herpes    Vaginal Pap smear, abnormal    Past Surgical History:  Procedure Laterality Date   CERVICAL BIOPSY  W/ LOOP ELECTRODE EXCISION     CESAREAN SECTION N/A 07/20/2018   Procedure: CESAREAN SECTION;  Surgeon: Essie Hart, MD;  Location: [facility];  Service: Obstetrics;  Laterality: N/A;   WISDOM TOOTH EXTRACTION     Social History   Socioeconomic History   Marital status: Married    Spouse name: Not on file   Number of children: 1   Years of education: Not on file   Highest education level: Not on file  Occupational History   Occupation: Teacher, adult education: [place]    Comment: GI/endo  at [employer]  Tobacco Use   Smoking status: Never   Smokeless tobacco: Never  Vaping Use   Vaping Use: Never used  Substance and Sexual Activity   Alcohol use: Yes    Alcohol/week: 7.0 standard drinks of alcohol    Types: 6 Glasses of wine, 1 Shots of liquor per week   Drug use: No   Sexual activity: Yes    Birth control/protection: Condom  Other Topics Concern   Not on file  Social History Narrative   Not on file   Social Determinants of Health   Financial Resource Strain: Low Risk  (07/08/2018)   Overall Financial Resource Strain (CARDIA)    Difficulty of Paying Living Expenses: Not hard at all  Food Insecurity: No Food Insecurity (07/08/2018)   Hunger Vital Sign    Worried About Running Out of Food in the Last Year: Never true    Ran Out of Food in the Last Year: Never true  Transportation Needs: Unknown (07/08/2018)   PRAPARE - Administrator, Civil Service (Medical): No    Lack of Transportation (Non-Medical): Not on file  Physical Activity: Not on file  Stress: No Stress Concern  Present (07/08/2018)   [employer] of Occupational Health - Occupational Stress Questionnaire    Feeling of Stress : Only a little  Social Connections: Not on file  Intimate Partner Violence: Not At Risk (07/08/2018)   Humiliation, Afraid, Rape, and Kick questionnaire    Fear of Current or Ex-Partner: No    Emotionally Abused: No    Physically Abused: No    Sexually Abused: No   Family Status  Relation Name Status   Mother  Alive   Father  Alive   Sister 2 Alive   Brother 2 Alive   Youth worker  (Not Specified)   PGM  (Not Specified)       death in 70s due to MI   Family History  Problem Relation Age of Onset   Hypertension Mother    Hyperlipidemia Mother    Hyperlipidemia Father    Alcohol abuse Father    Cirrhosis Father    Hypertension Maternal Aunt    Heart disease Paternal Grandmother    No Known Allergies  Patient Care Team: Concepcion Gillott, Bonna Gains, NP as PCP -  General (Internal Medicine) Gynecology, [facility] Obstetric[facility] And (Obstetrics and Gynecology)   Medications: Outpatient Medications Prior to Visit  Medication Sig   buPROPion (WELLBUTRIN SR) 150 MG 12 hr tablet Take 1 tablet (150 mg total) by mouth 2 (two) times daily.   hydrOXYzine (VISTARIL) 25 MG capsule TAKE 1 CAPSULE (25 MG TOTAL) BY MOUTH DAILY AS NEEDED.   LORazepam (ATIVAN) 1 MG tablet Take 1-2 tablets (1-2 mg total) by mouth 2 (two) times daily as needed for anxiety.   pantoprazole (PROTONIX) 40 MG tablet TAKE 1 TABLET BY MOUTH EVERY DAY *90 DAY SUPPLY REQUIRED   sertraline (ZOLOFT) 50 MG tablet Take 1 tablet (50 mg total) by mouth daily.   sodium chloride (OCEAN) 0.65 % SOLN nasal spray Place 1 spray into both nostrils as needed for congestion.   Tretinoin 0.05 % LOTN Apply topically.   valACYclovir (VALTREX) 500 MG tablet TAKE 1 TABLET (500 MG TOTAL) BY MOUTH DAILY.   No facility-administered medications prior to visit.    Review of Systems  Constitutional:  Negative for activity change, appetite change and unexpected weight change.  Respiratory: Negative.    Cardiovascular: Negative.   Gastrointestinal: Negative.   Endocrine: Negative for cold intolerance and heat intolerance.  Genitourinary: Negative.   Musculoskeletal: Negative.   Skin: Negative.   Neurological: Negative.   Hematological: Negative.   Psychiatric/Behavioral:  Negative for behavioral problems, decreased concentration, dysphoric mood, hallucinations, self-injury, sleep disturbance and suicidal ideas. The patient is not nervous/anxious.         Objective:  BP 112/80 (BP Location: Left Arm, Patient Position: Sitting, Cuff Size: Large)   Pulse 84   Temp 98.1 F (36.7 C) (Temporal)   Resp 16   Ht 5' 4.5" (1.638 m)   Wt 231 lb 6.4 oz (105 kg)   LMP 07/16/2022 (Exact Date)   SpO2 99%   BMI 39.11 kg/m     Physical Exam Vitals and nursing note reviewed.  Constitutional:      General: She is not in  acute distress. HENT:     Right Ear: Tympanic membrane, ear canal and external ear normal.     Left Ear: Tympanic membrane, ear canal and external ear normal.     Nose: Nose normal.  Eyes:     Extraocular Movements: Extraocular movements intact.     Conjunctiva/sclera: Conjunctivae normal.     Pupils: Pupils  are equal, round, and reactive to light.  Neck:     Thyroid: No thyroid mass, thyromegaly or thyroid tenderness.  Cardiovascular:     Rate and Rhythm: Normal rate and regular rhythm.     Pulses: Normal pulses.     Heart sounds: Normal heart sounds.  Pulmonary:     Effort: Pulmonary effort is normal.     Breath sounds: Normal breath sounds.  Chest:  Breasts:    Breasts are symmetrical.     Right: Normal.     Left: Normal.  Abdominal:     General: Bowel sounds are normal.     Palpations: Abdomen is soft.  Musculoskeletal:        General: Normal range of motion.     Cervical back: Normal range of motion and neck supple.     Right lower leg: No edema.     Left lower leg: No edema.  Lymphadenopathy:     Cervical: No cervical adenopathy.     Upper Body:     Right upper body: No supraclavicular, axillary or pectoral adenopathy.     Left upper body: No supraclavicular, axillary or pectoral adenopathy.  Skin:    General: Skin is warm and dry.  Neurological:     Mental Status: She is alert and oriented to person, place, and time.     Cranial Nerves: No cranial nerve deficit.  Psychiatric:        Mood and Affect: Mood normal.        Behavior: Behavior normal.        Thought Content: Thought content normal.     No results found for any visits on 08/13/22.    Assessment & Plan:    Routine Health Maintenance and Physical Exam  Immunization History  Administered Date(s) Administered   Influenza,inj,Quad PF,6+ Mos 11/18/2020   Influenza-Unspecified 11/07/2016, 11/04/2018, 11/05/2019   PFIZER(Purple Top)SARS-COV-2 Vaccination 02/09/2019, 03/09/2019, 12/15/2019   Tdap  04/22/2018    Health Maintenance  Topic Date Due   PAP SMEAR-Modifier  02/09/2020   COVID-19 Vaccine (4 - 2023-24 season) 10/20/2021   Hepatitis C Screening  08/13/2023 (Originally 06/09/2005)   INFLUENZA VACCINE  09/20/2022   DTaP/Tdap/Td (2 - Td or Tdap) 04/21/2028   HIV Screening  Completed   HPV VACCINES  Aged Out   Discussed health benefits of physical activity, and encouraged her to engage in regular exercise appropriate for her age and condition.  Problem List Items Addressed This Visit       Other   Prediabetes   Relevant Orders   Hemoglobin A1c   Vitamin D deficiency   Relevant Orders   Vitamin D 1,25 dihydroxy   Other Visit Diagnoses     Encounter for preventative adult health care exam with abnormal findings    -  Primary   Relevant Orders   Comprehensive metabolic panel   Hypercholesteremia       Relevant Orders   Lipid panel      Return in about 1 year (around 08/13/2023) for CPE (fasting).     Alysia Penna, NP

## 2022-08-16 LAB — VITAMIN D 1,25 DIHYDROXY
Vitamin D 1, 25 (OH)2 Total: 43 pg/mL (ref 18–72)
Vitamin D2 1, 25 (OH)2: <8 pg/mL
Vitamin D3 1, 25 (OH)2: 43 pg/mL

## 2022-08-16 NOTE — Progress Notes (Signed)
Stable Follow instructions as discussed during office visit.

## 2022-08-17 ENCOUNTER — Encounter: Payer: Self-pay | Admitting: Nurse Practitioner

## 2022-08-21 DIAGNOSIS — R1013 Epigastric pain: Secondary | ICD-10-CM | POA: Diagnosis not present

## 2022-08-21 DIAGNOSIS — K2930 Chronic superficial gastritis without bleeding: Secondary | ICD-10-CM | POA: Diagnosis not present

## 2022-08-21 DIAGNOSIS — K219 Gastro-esophageal reflux disease without esophagitis: Secondary | ICD-10-CM | POA: Diagnosis not present

## 2022-08-21 DIAGNOSIS — K3189 Other diseases of stomach and duodenum: Secondary | ICD-10-CM | POA: Diagnosis not present

## 2022-09-05 ENCOUNTER — Encounter: Payer: Self-pay | Admitting: Nurse Practitioner

## 2022-09-05 DIAGNOSIS — F40243 Fear of flying: Secondary | ICD-10-CM

## 2022-09-05 DIAGNOSIS — F4323 Adjustment disorder with mixed anxiety and depressed mood: Secondary | ICD-10-CM | POA: Diagnosis not present

## 2022-09-05 DIAGNOSIS — K2100 Gastro-esophageal reflux disease with esophagitis, without bleeding: Secondary | ICD-10-CM

## 2022-09-05 MED ORDER — PANTOPRAZOLE SODIUM 40 MG PO TBEC
40.0000 mg | DELAYED_RELEASE_TABLET | ORAL | 1 refills | Status: DC
Start: 2022-09-05 — End: 2023-01-10

## 2022-09-05 MED ORDER — FAMOTIDINE 20 MG PO TABS
20.0000 mg | ORAL_TABLET | Freq: Every day | ORAL | 0 refills | Status: AC
Start: 2022-09-05 — End: ?

## 2022-09-05 MED ORDER — LORAZEPAM 1 MG PO TABS
1.0000 mg | ORAL_TABLET | Freq: Every day | ORAL | 0 refills | Status: DC | PRN
Start: 2022-09-05 — End: 2022-09-17

## 2022-09-05 NOTE — Telephone Encounter (Signed)
Pt called to reiterate the message below.

## 2022-09-17 ENCOUNTER — Encounter: Payer: Self-pay | Admitting: Nurse Practitioner

## 2022-09-17 DIAGNOSIS — F331 Major depressive disorder, recurrent, moderate: Secondary | ICD-10-CM

## 2022-09-19 DIAGNOSIS — F4323 Adjustment disorder with mixed anxiety and depressed mood: Secondary | ICD-10-CM | POA: Diagnosis not present

## 2022-10-15 DIAGNOSIS — Z3480 Encounter for supervision of other normal pregnancy, unspecified trimester: Secondary | ICD-10-CM | POA: Diagnosis not present

## 2022-10-15 DIAGNOSIS — Z3687 Encounter for antenatal screening for uncertain dates: Secondary | ICD-10-CM | POA: Diagnosis not present

## 2022-10-28 ENCOUNTER — Inpatient Hospital Stay (HOSPITAL_COMMUNITY)
Admission: AD | Admit: 2022-10-28 | Discharge: 2022-10-28 | Disposition: A | Discharge status: Home / Self Care | Payer: 59 | Attending: Family Medicine | Admitting: Family Medicine

## 2022-10-28 ENCOUNTER — Other Ambulatory Visit: Payer: Self-pay

## 2022-10-28 ENCOUNTER — Inpatient Hospital Stay (HOSPITAL_COMMUNITY): Payer: 59

## 2022-10-28 ENCOUNTER — Encounter (HOSPITAL_COMMUNITY): Payer: Self-pay | Admitting: *Deleted

## 2022-10-28 DIAGNOSIS — N939 Abnormal uterine and vaginal bleeding, unspecified: Secondary | ICD-10-CM

## 2022-10-28 DIAGNOSIS — Z3A01 Less than 8 weeks gestation of pregnancy: Secondary | ICD-10-CM

## 2022-10-28 DIAGNOSIS — Z6790 Unspecified blood type, Rh positive: Secondary | ICD-10-CM | POA: Insufficient documentation

## 2022-10-28 DIAGNOSIS — O021 Missed abortion: Secondary | ICD-10-CM | POA: Diagnosis not present

## 2022-10-28 DIAGNOSIS — N854 Malposition of uterus: Secondary | ICD-10-CM | POA: Diagnosis not present

## 2022-10-28 DIAGNOSIS — O208 Other hemorrhage in early pregnancy: Secondary | ICD-10-CM | POA: Diagnosis present

## 2022-10-28 DIAGNOSIS — R102 Pelvic and perineal pain: Secondary | ICD-10-CM | POA: Diagnosis not present

## 2022-10-28 DIAGNOSIS — O09511 Supervision of elderly primigravida, first trimester: Secondary | ICD-10-CM | POA: Diagnosis not present

## 2022-10-28 DIAGNOSIS — Z3A08 8 weeks gestation of pregnancy: Secondary | ICD-10-CM | POA: Diagnosis not present

## 2022-10-28 LAB — URINALYSIS, ROUTINE W REFLEX MICROSCOPIC
Bacteria, UA: NONE SEEN
Bilirubin Urine: NEGATIVE
Glucose, UA: 50 mg/dL — AB
Ketones, ur: NEGATIVE mg/dL
Leukocytes,Ua: NEGATIVE
Nitrite: NEGATIVE
Protein, ur: NEGATIVE mg/dL
Specific Gravity, Urine: 1.008 (ref 1.005–1.030)
pH: 5 (ref 5.0–8.0)

## 2022-10-28 MED ORDER — ONDANSETRON 4 MG PO TBDP: 4.0000 mg | ORAL_TABLET | Freq: Three times a day (TID) | ORAL | 0 refills | Status: DC | PRN

## 2022-10-28 MED ORDER — MISOPROSTOL 200 MCG PO TABS
ORAL_TABLET | ORAL | 1 refills | Status: AC
Start: 2022-10-28 — End: ?

## 2022-10-28 MED ORDER — OXYCODONE HCL 5 MG PO TABS
5.0000 mg | ORAL_TABLET | ORAL | 0 refills | Status: DC | PRN
Start: 2022-10-28 — End: 2022-10-29

## 2022-10-28 MED ORDER — LOPERAMIDE HCL 2 MG PO TABS: 2.0000 mg | ORAL_TABLET | Freq: Four times a day (QID) | ORAL | 0 refills | Status: DC | PRN

## 2022-10-28 NOTE — MAU Note (Signed)
Amy [name] is a 37 y.o. at [redacted]w[redacted]d here in MAU reporting: she's having bright red VB with wiping that happened once on Friday and then again today.  States had ultrasound in office on 10/17/2022, informed  has a small subchorionic hemorrhage.  Denies recent intercourse.  Endorses light pain in RLQ, but rates pain 0. LMP: NA Onset of complaint: Friday Pain score: 0 Vitals:   10/28/22 1856  BP: 117/77  Pulse: 94  Resp: 18  Temp: 98.2 F (36.8 C)  SpO2: 100%     FHT:NA Lab orders placed from triage:   UA

## 2022-10-28 NOTE — MAU Provider Note (Signed)
History     CSN: [id number]  Arrival date and time: 10/28/22 1802   Event Date/Time   First Provider Initiated Contact with Patient 10/28/22 2200      Chief Complaint  Patient presents with   Vaginal Bleeding   Amy [name] , a  37 y.o. G2P1001 at [redacted]w[redacted]d presents to MAU with complaints of bright red vaginal bleeding that started on Friday. Patient was recently diagnosed with a Subchorionic hematoma. Patient states that she has 1 episode of bright red bleeding on Friday without any abdominal pain. She states that on Saturday it was pinkish brow with wiping on saturday and then today after work she began having bright red bleeding, and a little heavier. She reports passing "tiny" clots but denies passing tissue. She states that her abdominal pain is intermittent and rates it a 2/10. She denies recent intercourse and denies abnormal vaginal discharge or urinary symptoms.          OB History     Gravida  2   Para  1   Term  1   Preterm      AB      Living  1      SAB      IAB      Ectopic      Multiple  0   Live Births  1           Past Medical History:  Diagnosis Date   Anxiety    with flying only   GERD (gastroesophageal reflux disease)    Herpes    Vaginal Pap smear, abnormal     Past Surgical History:  Procedure Laterality Date   CERVICAL BIOPSY  W/ LOOP ELECTRODE EXCISION     CESAREAN SECTION N/A 07/20/2018   Procedure: CESAREAN SECTION;  Surgeon: Essie Hart, MD;  Location: [facility] LD [facility]S;  Service: Obstetrics;  Laterality: N/A;   WISDOM TOOTH EXTRACTION      Family History  Problem Relation Age of Onset   Hypertension Mother    Hyperlipidemia Mother    Hyperlipidemia Father    Alcohol abuse Father    Cirrhosis Father    Hypertension Maternal Aunt    Heart disease Paternal Grandmother     Social History   Tobacco Use   Smoking status: Never   Smokeless tobacco: Never  Vaping Use   Vaping status: Never Used  Substance Use Topics    Alcohol use: Yes    Alcohol/week: 7.0 standard drinks of alcohol    Types: 6 Glasses of wine, 1 Shots of liquor per week   Drug use: No    Allergies: No Known Allergies  Medications Prior to Admission  Medication Sig Dispense Refill Last Dose   buPROPion (WELLBUTRIN SR) 150 MG 12 hr tablet Take 1 tablet (150 mg total) by mouth 2 (two) times daily. 180 tablet 2    famotidine (PEPCID) 20 MG tablet Take 1 tablet (20 mg total) by mouth at bedtime. 90 tablet 0    pantoprazole (PROTONIX) 40 MG tablet Take 1 tablet (40 mg total) by mouth every morning. 90 tablet 1    sertraline (ZOLOFT) 50 MG tablet Take 1 tablet (50 mg total) by mouth daily. 90 tablet 0    sodium chloride (OCEAN) 0.65 % SOLN nasal spray Place 1 spray into both nostrils as needed for congestion. 15 mL 0    Tretinoin 0.05 % LOTN Apply topically.      valACYclovir (VALTREX) 500 MG tablet TAKE 1 TABLET (  500 MG TOTAL) BY MOUTH DAILY. 90 tablet 1     Review of Systems  Constitutional:  Negative for chills, fatigue and fever.  Eyes:  Negative for pain and visual disturbance.  Respiratory:  Negative for apnea, shortness of breath and wheezing.   Cardiovascular:  Negative for chest pain and palpitations.  Gastrointestinal:  Negative for abdominal pain, constipation, diarrhea, nausea and vomiting.  Genitourinary:  Positive for vaginal bleeding and vaginal discharge. Negative for difficulty urinating, dysuria, pelvic pain and vaginal pain.  Musculoskeletal:  Negative for back pain.  Neurological:  Negative for seizures, weakness and headaches.  Psychiatric/Behavioral:  Negative for suicidal ideas.    Physical Exam   Blood pressure 117/77, pulse 94, temperature 98.2 F (36.8 C), temperature source Oral, resp. rate 18, height 5\' 4"  (1.626 m), weight 108.7 kg, last menstrual period 07/16/2022, SpO2 100%.  Physical Exam Vitals and nursing note reviewed.  Constitutional:      General: She is not in acute distress.    Appearance:  Normal appearance.  HENT:     Head: Normocephalic.  Pulmonary:     Effort: Pulmonary effort is normal.  Musculoskeletal:     Cervical back: Normal range of motion.  Skin:    General: Skin is warm and dry.  Neurological:     Mental Status: She is alert and oriented to person, place, and time.  Psychiatric:        Mood and Affect: Mood normal.     MAU Course  Procedures Orders Placed This Encounter  Procedures   US OB Transvaginal   Urinalysis, Routine w reflex microscopic -Urine, Clean Catch   Discharge patient   Results for orders placed or performed during the hospital encounter of 10/28/22 (from the past 24 hour(s))  Urinalysis, Routine w reflex microscopic -Urine, Clean Catch     Status: Abnormal   Collection Time: 10/28/22  7:06 PM  Result Value Ref Range   Color, Urine STRAW (A) YELLOW   APPearance CLEAR CLEAR   Specific Gravity, Urine 1.008 1.005 - 1.030   pH 5.0 5.0 - 8.0   Glucose, UA 50 (A) NEGATIVE mg/dL   Hgb urine dipstick MODERATE (A) NEGATIVE   Bilirubin Urine NEGATIVE NEGATIVE   Ketones, ur NEGATIVE NEGATIVE mg/dL   Protein, ur NEGATIVE NEGATIVE mg/dL   Nitrite NEGATIVE NEGATIVE   Leukocytes,Ua NEGATIVE NEGATIVE   RBC / HPF 0-5 0 - 5 RBC/hpf   WBC, UA 0-5 0 - 5 WBC/hpf   Bacteria, UA NONE SEEN NONE SEEN   Squamous Epithelial / HPF 0-5 0 - 5 /HPF   US OB Transvaginal  Result Date: 10/28/2022 CLINICAL DATA:  Pregnant, assigned gestational age [redacted] weeks 2 days. Vaginal bleeding, known subchorionic hemorrhage, pelvic cramping. EXAM: TRANSVAGINAL OB ULTRASOUND TECHNIQUE: Transvaginal ultrasound was performed for complete evaluation of the gestation as well as the maternal uterus, adnexal regions, and pelvic cul-de-sac. COMPARISON:  None Available. FINDINGS: Intrauterine gestational sac: Present Yolk sac:  Visualized. Embryo:  Visualized. Cardiac Activity: Not visualized Heart Rate:   Not applicable CRL:   7 mm   6 w 4 d                  [place] EDC: Not applicable  Subchorionic hemorrhage:  Small subchorionic hemorrhage noted Maternal uterus/adnexae: The uterus is anteverted. The cervix is closed and is unremarkable. Caesarean section defect noted within the lower anterior uterine segment. No intrauterine masses are seen. There is no free fluid within the cul-de-sac. The maternal right  ovary is unremarkable. The maternal left ovary is not visualized. IMPRESSION: 7 mm fetal pole identified with no cardiac activity. This is compatible with a nonviable gestation. Correlation with serial beta HCG and possible follow-up sonography may be helpful to document resolution. Electronically Signed   By: Helyn Numbers M.D.   On: 10/28/2022 21:19    MDM - [place] results reveal a IUP without cardiac activity measuring [redacted]w[redacted]d. Definitive for a missed AB.  - Call placed to on call office for SAB follow up, Per Dr. Salvadore Dom she will arrange for outpatient follow up in the morning.  - Plan for discharge   Assessment and Plan   1. Missed ab   2. [redacted] weeks gestation of pregnancy   3. Vaginal bleeding    - Reviewed findings with patient and patients support person. Patient appropriately tearful.  - Condolences given.  - Reviewed options of expectant management vs. Cytotec vs D&C.  - Patient unsure on what she would like to do.  - Rx for cytotec immodium, zofran and short course of Roxicodone sent to pharmacy just in case.  - Worsening signs and return precautions reviewed.  - Patient discharged home in stable condition and may return to MAU as needed.   Claudette Head, MSN CNM  10/28/2022, 10:00 PM

## 2022-10-29 ENCOUNTER — Other Ambulatory Visit (HOSPITAL_COMMUNITY): Payer: Self-pay

## 2022-10-29 ENCOUNTER — Other Ambulatory Visit (HOSPITAL_COMMUNITY): Payer: Self-pay | Admitting: Psychiatry

## 2022-10-29 DIAGNOSIS — F411 Generalized anxiety disorder: Secondary | ICD-10-CM

## 2022-10-29 DIAGNOSIS — F339 Major depressive disorder, recurrent, unspecified: Secondary | ICD-10-CM

## 2022-10-30 ENCOUNTER — Encounter (HOSPITAL_COMMUNITY): Payer: Self-pay | Admitting: Obstetrics and Gynecology

## 2022-10-30 ENCOUNTER — Other Ambulatory Visit: Payer: Self-pay | Admitting: Nurse Practitioner

## 2022-10-30 ENCOUNTER — Other Ambulatory Visit: Payer: Self-pay

## 2022-10-30 DIAGNOSIS — F411 Generalized anxiety disorder: Secondary | ICD-10-CM

## 2022-10-30 DIAGNOSIS — O021 Missed abortion: Secondary | ICD-10-CM | POA: Diagnosis not present

## 2022-10-30 DIAGNOSIS — F339 Major depressive disorder, recurrent, unspecified: Secondary | ICD-10-CM

## 2022-10-30 NOTE — Progress Notes (Signed)
COVID Vaccine Completed:  Yes  Date of COVID positive in last 90 days:  No  PCP - Alysia Penna, NP Cardiologist -  N/A  Chest x-ray - N/A EKG - N/A Stress Test - N/A ECHO - N/A Cardiac Cath - N/A Pacemaker/ICD device last checked: Spinal Cord Stimulator:N/A  Bowel Prep - N/A  Sleep Study - N/A CPAP -   Fasting Blood Sugar - N/A Checks Blood Sugar _____ times a day  Last dose of GLP1 agonist-  N/A GLP1 instructions:  N/A   Last dose of SGLT-2 inhibitors-  N/A SGLT-2 instructions: N/A   Blood Thinner Instructions:  TimeN/A Aspirin Instructions: Last Dose:  Activity level:  Can go up a flight of stairs and perform activities of daily living without stopping and without symptoms of chest pain or shortness of breath.  Anesthesia review: N/A  Patient denies shortness of breath, fever, cough and chest pain at PAT appointment  Patient verbalized understanding of instructions that were given to them at the PAT appointment. Patient was also instructed that they will need to review over the PAT instructions again at home before surgery.

## 2022-10-30 NOTE — H&P (Signed)
Amy [name] is an 37 y.o. G2P1001 admitted for Hysteroscopy with Dilation and suction curettage under US guidance with ANORA genetic testing.  Patient had an early OB ultrasound in the office on 10/15/2022 showing a [redacted]w[redacted]d gestation with FHR of 118. A small SCH 1.8cm was noted inferior to the gestational sac. She began having spotting on 9/8 and went to [facility]. Ultrasound revealed a 7mm fetal pole without cardiac activity. She was given Cytotec for medical management but prefers to have surgical management. She desires ANORA genetic testing.  Narrative & Impression  CLINICAL DATA:  Pregnant, assigned gestational age [redacted] weeks 2 days. Vaginal bleeding, known subchorionic hemorrhage, pelvic cramping.   EXAM: TRANSVAGINAL OB ULTRASOUND   TECHNIQUE: Transvaginal ultrasound was performed for complete evaluation of the gestation as well as the maternal uterus, adnexal regions, and pelvic cul-de-sac.   COMPARISON:  None Available.   FINDINGS: Intrauterine gestational sac: Present   Yolk sac:  Visualized.   Embryo:  Visualized.   Cardiac Activity: Not visualized   Heart Rate:   Not applicable   CRL:   7 mm   6 w 4 d                  [place] EDC: Not applicable   Subchorionic hemorrhage:  Small subchorionic hemorrhage noted   Maternal uterus/adnexae: The uterus is anteverted. The cervix is closed and is unremarkable. Caesarean section defect noted within the lower anterior uterine segment. No intrauterine masses are seen. There is no free fluid within the cul-de-sac. The maternal right ovary is unremarkable. The maternal left ovary is not visualized.   IMPRESSION: 7 mm fetal pole identified with no cardiac activity. This is compatible with a nonviable gestation. Correlation with serial beta HCG and possible follow-up sonography may be helpful to document resolution.     Electronically Signed   By: Helyn Numbers M.D.   On: 10/28/2022 21:19      Patient Active Problem List    Diagnosis Date Noted   Rh(D) positive 10/28/2022   Hypercholesteremia 08/13/2022   Moderate episode of recurrent major depressive disorder (HCC) 06/25/2022   Low back pain 04/16/2022   Post-inflammatory hyperpigmentation 11/18/2020   Gastroesophageal reflux disease 06/22/2020   Genital herpes simplex 06/22/2020   Acanthosis nigricans 01/27/2020   Obesity 01/27/2020   GAD (generalized anxiety disorder) 11/06/2019   Mixed hyperlipidemia 12/07/2018   Radial styloid tenosynovitis of right hand 09/09/2018   Vitamin D deficiency 03/04/2017   Prediabetes 05/31/2016   Insomnia 11/04/2015   Depression, recurrent (HCC) 11/04/2015    MEDICAL/FAMILY/SOCIAL HX: Patient's last menstrual period was 07/16/2022 (exact date).    Past Medical History:  Diagnosis Date   Anxiety    with flying only   GERD (gastroesophageal reflux disease)    Herpes    Vaginal Pap smear, abnormal     Past Surgical History:  Procedure Laterality Date   CERVICAL BIOPSY  W/ LOOP ELECTRODE EXCISION     CESAREAN SECTION N/A 07/20/2018   Procedure: CESAREAN SECTION;  Surgeon: Essie Hart, MD;  Location: [facility];  Service: Obstetrics;  Laterality: N/A;   WISDOM TOOTH EXTRACTION      Family History  Problem Relation Age of Onset   Hypertension Mother    Hyperlipidemia Mother    Hyperlipidemia Father    Alcohol abuse Father    Cirrhosis Father    Hypertension Maternal Aunt    Heart disease Paternal Grandmother     Social History:  reports that she has  never smoked. She has never used smokeless tobacco. She reports current alcohol use of about 7.0 standard drinks of alcohol per week. She reports that she does not use drugs.  ALLERGIES/MEDS:  Allergies: No Known Allergies  No medications prior to admission.     Review of Systems  Constitutional: Negative.   HENT: Negative.    Eyes: Negative.   Respiratory: Negative.    Cardiovascular: Negative.   Gastrointestinal: Negative.   Genitourinary:  Negative.   Musculoskeletal: Negative.   Skin: Negative.   Neurological: Negative.   Endo/Heme/Allergies: Negative.   Psychiatric/Behavioral: Negative.      Last menstrual period 07/16/2022. Gen:  NAD, pleasant and cooperative Cardio:  RRR Pulm:  CTAB, no wheezes/rales/rhonchi Abd:  Soft, non-distended, non-tender throughout, no rebound/guarding Ext:  No bilateral LE edema, no bilateral calf tenderness  No results found for this or any previous visit (from the past 24 hour(s)).  US OB Transvaginal  Result Date: 10/28/2022 CLINICAL DATA:  Pregnant, assigned gestational age [redacted] weeks 2 days. Vaginal bleeding, known subchorionic hemorrhage, pelvic cramping. EXAM: TRANSVAGINAL OB ULTRASOUND TECHNIQUE: Transvaginal ultrasound was performed for complete evaluation of the gestation as well as the maternal uterus, adnexal regions, and pelvic cul-de-sac. COMPARISON:  None Available. FINDINGS: Intrauterine gestational sac: Present Yolk sac:  Visualized. Embryo:  Visualized. Cardiac Activity: Not visualized Heart Rate:   Not applicable CRL:   7 mm   6 w 4 d                  [place] EDC: Not applicable Subchorionic hemorrhage:  Small subchorionic hemorrhage noted Maternal uterus/adnexae: The uterus is anteverted. The cervix is closed and is unremarkable. Caesarean section defect noted within the lower anterior uterine segment. No intrauterine masses are seen. There is no free fluid within the cul-de-sac. The maternal right ovary is unremarkable. The maternal left ovary is not visualized. IMPRESSION: 7 mm fetal pole identified with no cardiac activity. This is compatible with a nonviable gestation. Correlation with serial beta HCG and possible follow-up sonography may be helpful to document resolution. Electronically Signed   By: Helyn Numbers M.D.   On: 10/28/2022 21:19     ASSESSMENT/PLAN: Amy [name] is a 37 y.o. G2P1001 who is admitted for Hysteroscopy with Dilation and suction curettage under US  guidance with ANORA genetic testing.  - Admit to [facility] - Admit labs (CBC, T&S) - Diet:  Per anesthesia/ERAS pathway - IVF:  Per anesthesia - VTE Prophylaxis:  SCDs - Antibiotics: Doxycycline 200mg  IV on call to OR - D/C home same-day  Consents: I discussed with the patient that this surgery is performed to remove the products of conception via suction.  Prior to surgery, the risks and benefits of the surgery, as well as alternative treatments, have been discussed.  The risks include, but are not limited to bleeding, including the need for a blood transfusion, infection, damage to organs and tissues, including uterine perforation, requiring additional surgery, postoperative pain, short-term and long-term, deep vein thrombosis and/or pulmonary embolism, painful intercourse, complications the course of which cannot be predicted or prevented, and death.   Patient was consented for blood products.  The patient is aware that bleeding may result in the need for a blood transfusion which includes risk of transmission of HIV (1:2 million), Hepatitis C (1:2 million), and Hepatitis B (1:200 thousand) and transfusion reaction.  Patient voiced understanding of the above risks as well as understanding of indications for blood transfusion.    Steva Ready,  DO

## 2022-10-31 ENCOUNTER — Encounter (HOSPITAL_COMMUNITY): Payer: Self-pay | Admitting: Obstetrics and Gynecology

## 2022-10-31 ENCOUNTER — Ambulatory Visit (HOSPITAL_COMMUNITY): Payer: 59 | Admitting: Anesthesiology

## 2022-10-31 ENCOUNTER — Ambulatory Visit (HOSPITAL_COMMUNITY)
Admission: RE | Admit: 2022-10-31 | Discharge: 2022-10-31 | Disposition: A | Discharge status: Home / Self Care | Payer: 59 | Attending: Obstetrics and Gynecology | Admitting: Obstetrics and Gynecology

## 2022-10-31 ENCOUNTER — Other Ambulatory Visit (HOSPITAL_COMMUNITY): Payer: Self-pay

## 2022-10-31 ENCOUNTER — Ambulatory Visit (HOSPITAL_COMMUNITY): Payer: 59

## 2022-10-31 ENCOUNTER — Ambulatory Visit (HOSPITAL_BASED_OUTPATIENT_CLINIC_OR_DEPARTMENT_OTHER): Payer: 59 | Admitting: Anesthesiology

## 2022-10-31 ENCOUNTER — Encounter (HOSPITAL_COMMUNITY)
Admission: RE | Disposition: A | Discharge status: Home / Self Care | Payer: Self-pay | Source: Home / Self Care | Attending: Obstetrics and Gynecology

## 2022-10-31 ENCOUNTER — Ambulatory Visit (HOSPITAL_COMMUNITY)
Admission: RE | Admit: 2022-10-31 | Discharge: 2022-10-31 | Disposition: A | Discharge status: Home / Self Care | Payer: 59 | Source: Ambulatory Visit | Attending: Obstetrics and Gynecology | Admitting: Obstetrics and Gynecology

## 2022-10-31 DIAGNOSIS — O99611 Diseases of the digestive system complicating pregnancy, first trimester: Secondary | ICD-10-CM | POA: Insufficient documentation

## 2022-10-31 DIAGNOSIS — K219 Gastro-esophageal reflux disease without esophagitis: Secondary | ICD-10-CM | POA: Diagnosis not present

## 2022-10-31 DIAGNOSIS — O021 Missed abortion: Secondary | ICD-10-CM | POA: Insufficient documentation

## 2022-10-31 DIAGNOSIS — O039 Complete or unspecified spontaneous abortion without complication: Secondary | ICD-10-CM | POA: Diagnosis not present

## 2022-10-31 DIAGNOSIS — F418 Other specified anxiety disorders: Secondary | ICD-10-CM | POA: Diagnosis not present

## 2022-10-31 DIAGNOSIS — Z3A08 8 weeks gestation of pregnancy: Secondary | ICD-10-CM | POA: Insufficient documentation

## 2022-10-31 DIAGNOSIS — O99341 Other mental disorders complicating pregnancy, first trimester: Secondary | ICD-10-CM | POA: Insufficient documentation

## 2022-10-31 DIAGNOSIS — Z3A01 Less than 8 weeks gestation of pregnancy: Secondary | ICD-10-CM

## 2022-10-31 DIAGNOSIS — O09521 Supervision of elderly multigravida, first trimester: Secondary | ICD-10-CM | POA: Insufficient documentation

## 2022-10-31 HISTORY — PX: DILATION AND EVACUATION: SHX1459

## 2022-10-31 HISTORY — DX: Depression, unspecified: F32.A

## 2022-10-31 LAB — CBC
HCT: 36.6 % (ref 36.0–46.0)
Hemoglobin: 12.1 g/dL (ref 12.0–15.0)
MCH: 29.2 pg (ref 26.0–34.0)
MCHC: 33.1 g/dL (ref 30.0–36.0)
MCV: 88.2 fL (ref 80.0–100.0)
Platelets: 280 10*3/uL (ref 150–400)
RBC: 4.15 MIL/uL (ref 3.87–5.11)
RDW: 13.2 % (ref 11.5–15.5)
WBC: 8.7 10*3/uL (ref 4.0–10.5)
nRBC: 0 % (ref 0.0–0.2)

## 2022-10-31 LAB — TYPE AND SCREEN
ABO/RH(D): A POS
Antibody Screen: NEGATIVE

## 2022-10-31 SURGERY — DILATION AND EVACUATION, UTERUS
Anesthesia: General | Site: Vagina

## 2022-10-31 MED ORDER — PROPOFOL 10 MG/ML IV BOLUS
INTRAVENOUS | Status: DC | PRN
Start: 2022-10-31 — End: 2022-10-31
  Administered 2022-10-31: 150 mg via INTRAVENOUS
  Administered 2022-10-31: 50 mg via INTRAVENOUS

## 2022-10-31 MED ORDER — ONDANSETRON HCL 4 MG/2ML IJ SOLN
INTRAMUSCULAR | Status: AC
  Filled 2022-10-31: qty 2

## 2022-10-31 MED ORDER — MIDAZOLAM HCL 2 MG/2ML IJ SOLN
INTRAMUSCULAR | Status: AC
  Filled 2022-10-31: qty 2

## 2022-10-31 MED ORDER — FENTANYL CITRATE (PF) 100 MCG/2ML IJ SOLN
INTRAMUSCULAR | Status: DC | PRN
  Administered 2022-10-31: 100 ug via INTRAVENOUS

## 2022-10-31 MED ORDER — SILVER NITRATE-POT NITRATE 75-25 % EX MISC
CUTANEOUS | Status: AC
  Filled 2022-10-31: qty 10

## 2022-10-31 MED ORDER — LIDOCAINE HCL (CARDIAC) PF 100 MG/5ML IV SOSY
PREFILLED_SYRINGE | INTRAVENOUS | Status: DC | PRN
  Administered 2022-10-31: 80 mg via INTRAVENOUS

## 2022-10-31 MED ORDER — IBUPROFEN 800 MG PO TABS
800.0000 mg | ORAL_TABLET | Freq: Three times a day (TID) | ORAL | 0 refills | Status: DC | PRN
  Filled 2022-10-31: qty 30, 10d supply, fill #0

## 2022-10-31 MED ORDER — ACETAMINOPHEN 500 MG PO TABS
1000.0000 mg | ORAL_TABLET | Freq: Once | ORAL | Status: AC
  Administered 2022-10-31: 1000 mg via ORAL

## 2022-10-31 MED ORDER — FENTANYL CITRATE PF 50 MCG/ML IJ SOSY
PREFILLED_SYRINGE | INTRAMUSCULAR | Status: AC
  Filled 2022-10-31: qty 1

## 2022-10-31 MED ORDER — LACTATED RINGERS IV SOLN: INTRAVENOUS | Status: DC

## 2022-10-31 MED ORDER — SILVER NITRATE-POT NITRATE 75-25 % EX MISC
CUTANEOUS | Status: DC | PRN
Start: 2022-10-31 — End: 2022-10-31
  Administered 2022-10-31: 2

## 2022-10-31 MED ORDER — FENTANYL CITRATE (PF) 100 MCG/2ML IJ SOLN
INTRAMUSCULAR | Status: AC
  Filled 2022-10-31: qty 2

## 2022-10-31 MED ORDER — METHYLERGONOVINE MALEATE 0.2 MG/ML IJ SOLN
INTRAMUSCULAR | Status: DC | PRN
Start: 2022-10-31 — End: 2022-10-31
  Administered 2022-10-31: .2 mg via INTRAMUSCULAR

## 2022-10-31 MED ORDER — SUGAMMADEX SODIUM 200 MG/2ML IV SOLN
INTRAVENOUS | Status: DC | PRN
Start: 2022-10-31 — End: 2022-10-31
  Administered 2022-10-31: 200 mg via INTRAVENOUS

## 2022-10-31 MED ORDER — LIDOCAINE HCL (PF) 2 % IJ SOLN
INTRAMUSCULAR | Status: AC
  Filled 2022-10-31: qty 5

## 2022-10-31 MED ORDER — FENTANYL CITRATE PF 50 MCG/ML IJ SOSY
25.0000 ug | PREFILLED_SYRINGE | INTRAMUSCULAR | Status: DC | PRN
  Administered 2022-10-31: 50 ug via INTRAVENOUS

## 2022-10-31 MED ORDER — ONDANSETRON HCL 4 MG/2ML IJ SOLN
INTRAMUSCULAR | Status: DC | PRN
  Administered 2022-10-31: 4 mg via INTRAVENOUS

## 2022-10-31 MED ORDER — ROCURONIUM BROMIDE 10 MG/ML (PF) SYRINGE
PREFILLED_SYRINGE | INTRAVENOUS | Status: AC
  Filled 2022-10-31: qty 10

## 2022-10-31 MED ORDER — ROCURONIUM BROMIDE 100 MG/10ML IV SOLN
INTRAVENOUS | Status: DC | PRN
  Administered 2022-10-31: 20 mg via INTRAVENOUS

## 2022-10-31 MED ORDER — DEXAMETHASONE SODIUM PHOSPHATE 4 MG/ML IJ SOLN
INTRAMUSCULAR | Status: DC | PRN
Start: 2022-10-31 — End: 2022-10-31
  Administered 2022-10-31: 4 mg via INTRAVENOUS

## 2022-10-31 MED ORDER — ACETAMINOPHEN 10 MG/ML IV SOLN
INTRAVENOUS | Status: AC
  Filled 2022-10-31: qty 100

## 2022-10-31 MED ORDER — ORAL CARE MOUTH RINSE: 15.0000 mL | Freq: Once | OROMUCOSAL | Status: AC

## 2022-10-31 MED ORDER — SCOPOLAMINE 1 MG/3DAYS TD PT72
MEDICATED_PATCH | TRANSDERMAL | Status: AC
  Filled 2022-10-31: qty 1

## 2022-10-31 MED ORDER — PROPOFOL 10 MG/ML IV BOLUS
INTRAVENOUS | Status: AC
  Filled 2022-10-31: qty 20

## 2022-10-31 MED ORDER — 0.9 % SODIUM CHLORIDE (POUR BTL) OPTIME
TOPICAL | Status: DC | PRN
Start: 2022-10-31 — End: 2022-10-31
  Administered 2022-10-31: 1000 mL

## 2022-10-31 MED ORDER — MISOPROSTOL 200 MCG PO TABS
ORAL_TABLET | ORAL | Status: AC
  Filled 2022-10-31: qty 1

## 2022-10-31 MED ORDER — MIDAZOLAM HCL 5 MG/5ML IJ SOLN
INTRAMUSCULAR | Status: DC | PRN
  Administered 2022-10-31: 2 mg via INTRAVENOUS

## 2022-10-31 MED ORDER — DOXYCYCLINE HYCLATE 100 MG IV SOLR
200.0000 mg | INTRAVENOUS | Status: AC
  Administered 2022-10-31: 200 mg via INTRAVENOUS
  Filled 2022-10-31: qty 200

## 2022-10-31 MED ORDER — CHLORHEXIDINE GLUCONATE 0.12 % MT SOLN
15.0000 mL | Freq: Once | OROMUCOSAL | Status: AC
  Administered 2022-10-31: 15 mL via OROMUCOSAL

## 2022-10-31 MED ORDER — CARBOPROST TROMETHAMINE 250 MCG/ML IM SOLN
INTRAMUSCULAR | Status: AC
  Filled 2022-10-31: qty 1

## 2022-10-31 MED ORDER — SCOPOLAMINE 1 MG/3DAYS TD PT72
1.0000 | MEDICATED_PATCH | Freq: Once | TRANSDERMAL | Status: DC
  Administered 2022-10-31: 1.5 mg via TRANSDERMAL

## 2022-10-31 SURGICAL SUPPLY — 20 items
CATH ROBINSON RED A/P 16FR (CATHETERS) ×1 IMPLANT
FILTER UTR ASPR ASSEMBLY (MISCELLANEOUS) ×1 IMPLANT
GLOVE BIO SURGEON STRL SZ 6.5 (GLOVE) ×1 IMPLANT
GLOVE BIOGEL PI IND STRL 7.0 (GLOVE) ×2 IMPLANT
GOWN STRL REUS W/ TWL LRG LVL3 (GOWN DISPOSABLE) ×2 IMPLANT
GOWN STRL REUS W/TWL LRG LVL3 (GOWN DISPOSABLE) ×2
HOSE CONNECTING 18IN BERKELEY (TUBING) ×1 IMPLANT
KIT BERKELEY 1ST TRI 3/8 NO TR (MISCELLANEOUS) ×1 IMPLANT
KIT BERKELEY 1ST TRIMESTER 3/8 (MISCELLANEOUS) ×1 IMPLANT
NS IRRIG 1000ML POUR BTL (IV SOLUTION) ×1 IMPLANT
PACK VAGINAL MINOR WOMEN LF (CUSTOM PROCEDURE TRAY) ×1 IMPLANT
PAD OB MATERNITY 4.3X12.25 (PERSONAL CARE ITEMS) ×1 IMPLANT
SET BERKELEY SUCTION TUBING (SUCTIONS) ×1 IMPLANT
SPIKE FLUID TRANSFER (MISCELLANEOUS) ×1 IMPLANT
TOWEL GREEN STERILE FF (TOWEL DISPOSABLE) ×2 IMPLANT
UNDERPAD 30X36 HEAVY ABSORB (UNDERPADS AND DIAPERS) ×1 IMPLANT
VACURETTE 10 RIGID CVD (CANNULA) IMPLANT
VACURETTE 7MM CVD STRL WRAP (CANNULA) IMPLANT
VACURETTE 8 RIGID CVD (CANNULA) IMPLANT
VACURETTE 9 RIGID CVD (CANNULA) IMPLANT

## 2022-10-31 NOTE — Interval H&P Note (Signed)
History and Physical Interval Note:  10/31/2022 12:12 PM  Amy [name]  has presented today for surgery, with the diagnosis of Miscarriage.  The various methods of treatment have been discussed with the patient and family. After consideration of risks, benefits and other options for treatment, the patient has consented to  Procedure(s): DILATATION AND EVACUATION UNDER ULTRASOUND GUIDANCE  WITH ANORA (N/A) as a surgical intervention.  The patient's history has been reviewed, patient examined, no change in status, stable for surgery.  I have reviewed the patient's chart and labs.  Questions were answered to the patient's satisfaction.     Steva Ready

## 2022-10-31 NOTE — Transfer of Care (Signed)
Immediate Anesthesia Transfer of Care Note  Patient: Amy [name]  Procedure(s) Performed: DILATATION AND EVACUATION UNDER ULTRASOUND GUIDANCE  WITH ANORA (Vagina )  Patient Location: PACU  Anesthesia Type:General  Level of Consciousness: awake and alert   Airway & Oxygen Therapy: Patient Spontanous Breathing and Patient connected to face mask oxygen  Post-op Assessment: Report given to RN, Post -op Vital signs reviewed and stable, and Patient moving all extremities X 4  Post vital signs: Reviewed and stable  Last Vitals:  Vitals Value Taken Time  BP 106/62 10/31/22 1456  Temp    Pulse 105 10/31/22 1500  Resp 21 10/31/22 1500  SpO2 97 % 10/31/22 1500  Vitals shown include unfiled device data.  Last Pain:  Vitals:   10/31/22 1109  TempSrc: Oral  PainSc: 0-No pain      Patients Stated Pain Goal: 3 (10/31/22 1109)  Complications: No notable events documented.

## 2022-10-31 NOTE — Anesthesia Preprocedure Evaluation (Addendum)
Anesthesia Evaluation  Patient identified by MRN, date of birth, ID band Patient awake    Reviewed: Allergy & Precautions, NPO status , Patient's Chart, lab work & pertinent test results  Airway Mallampati: III  TM Distance: >3 FB Neck ROM: Full    Dental no notable dental hx. (+) Teeth Intact, Dental Advisory Given   Pulmonary neg pulmonary ROS   Pulmonary exam normal breath sounds clear to auscultation       Cardiovascular negative cardio ROS Normal cardiovascular exam Rhythm:Regular Rate:Normal     Neuro/Psych  PSYCHIATRIC DISORDERS Anxiety Depression    negative neurological ROS     GI/Hepatic Neg liver ROS,GERD  ,,  Endo/Other    Morbid obesity (BMI 40)  Renal/GU negative Renal ROS  negative genitourinary   Musculoskeletal negative musculoskeletal ROS (+)    Abdominal   Peds  Hematology negative hematology ROS (+)   Anesthesia Other Findings Miscarriage at [redacted]wks gestation  Reproductive/Obstetrics                             Anesthesia Physical Anesthesia Plan  ASA: 2  Anesthesia Plan: General   Post-op Pain Management: Ofirmev IV (intra-op)*   Induction: Intravenous  PONV Risk Score and Plan: 3 and Ondansetron, Dexamethasone and Midazolam  Airway Management Planned: Oral ETT  Additional Equipment:   Intra-op Plan:   Post-operative Plan: Extubation in OR  Informed Consent: I have reviewed the patients History and Physical, chart, labs and discussed the procedure including the risks, benefits and alternatives for the proposed anesthesia with the patient or authorized representative who has indicated his/her understanding and acceptance.     Dental advisory given  Plan Discussed with: CRNA  Anesthesia Plan Comments:        Anesthesia Quick Evaluation

## 2022-10-31 NOTE — Anesthesia Procedure Notes (Signed)
Procedure Name: Intubation Date/Time: 10/31/2022 2:17 PM  Performed by: Ahmed Prima, CRNAPre-anesthesia Checklist: Patient identified, Emergency Drugs available, Suction available and Patient being monitored Patient Re-evaluated:Patient Re-evaluated prior to induction Oxygen Delivery Method: Circle System Utilized Preoxygenation: Pre-oxygenation with 100% oxygen Induction Type: IV induction Ventilation: Mask ventilation without difficulty and Oral airway inserted - appropriate to patient size Laryngoscope Size: Glidescope and 3 Grade View: Grade I Tube type: Oral Tube size: 7.5 mm Number of attempts: 1 Airway Equipment and Method: Stylet and Oral airway Placement Confirmation: ETT inserted through vocal cords under direct vision, positive ETCO2 and breath sounds checked- equal and bilateral Secured at: 23 cm Tube secured with: Tape Dental Injury: Teeth and Oropharynx as per pre-operative assessment  Comments: Pt. Had pre-existing chipped upper front teeth prior to dl/intubation attempts. 1st attempt with Hyacinth Meeker 2 - able to visualize the epiglottis only. Pt. BMV and glidescope used for successful intubation.

## 2022-10-31 NOTE — Op Note (Signed)
Pre Op Dx:   Missed abortion  Post Op Dx:   Same as pre-operative diagnosis   Procedure:   Dilation and suction curettage under ultrasound guidance   Surgeon:  Dr. Steva Ready Assistants:  None Anesthesia:  General   EBL:  40cc  IVF:  See anesthesia documentation UOP:  50cc via in-and-out catheter   Drains:  In-and-out catheter Specimen removed:  Products of conception - sent to pathology and portion sent for [facility] genetic testing Device(s) implanted: None Case Type:  Clean-contaminated Findings:  Normal-appearing cervix. Absence of fetal cardiac activity noted. Thin uterine stripe after completion of procedure. Complications: None Indications:  37 y.o. G2P1001 with missed AB at 8 weeks who desired definitive surgical management.  Description of each procedure:  After informed consent was obtained the patient was taken to the operating room in the dorsal supine position.  After administration of general anesthesia, the patient was placed in the dorsal lithotomy position and prepped and draped in the usual sterile fashion. A pre-operative time-out was completed.  The anterior lip of the cervix was grasped with a single-tooth tenaculum and the cervix was serially dilated to accommodate an 8mm suction cannula. The cannula was gently advanced to the fundus, then withdrawn under suction in a spiral fashion with return of products of conception. Multiple passes were required. Hemostasis adequate. Methergine 0.20mg  IM was administered intraoperatively due to brief moment of brisk bleeding. Complete uterine evacuation confirmed with thin uterine stripe noted on ultrasound.The single-tooth tenaculum was removed and its sites were made hemostatic using silver nitrate.  Adequate hemostasis was noted.  The patient was awakened and extubated and appeared to have tolerated the procedure well.  All counts were correct.  Disposition:  PACU  Steva Ready, DO

## 2022-11-01 ENCOUNTER — Encounter (HOSPITAL_COMMUNITY): Payer: Self-pay | Admitting: Obstetrics and Gynecology

## 2022-11-01 DIAGNOSIS — F4323 Adjustment disorder with mixed anxiety and depressed mood: Secondary | ICD-10-CM | POA: Diagnosis not present

## 2022-11-01 LAB — SURGICAL PATHOLOGY

## 2022-11-01 NOTE — Anesthesia Postprocedure Evaluation (Signed)
Anesthesia Post Note  Patient: Amy [name]  Procedure(s) Performed: DILATATION AND EVACUATION UNDER ULTRASOUND GUIDANCE  WITH ANORA (Vagina )     Patient location during evaluation: PACU Anesthesia Type: General Level of consciousness: awake and alert Pain management: pain level controlled Vital Signs Assessment: post-procedure vital signs reviewed and stable Respiratory status: spontaneous breathing, nonlabored ventilation, respiratory function stable and patient connected to nasal cannula oxygen Cardiovascular status: blood pressure returned to baseline and stable Postop Assessment: no apparent nausea or vomiting Anesthetic complications: no  No notable events documented.  Last Vitals:  Vitals:   10/31/22 1530 10/31/22 1615  BP: 103/72 103/60  Pulse: 94 87  Resp: 16 14  Temp:  36.4 C  SpO2: 100% 98%    Last Pain:  Vitals:   10/31/22 1615  TempSrc: Oral  PainSc: 1    Pain Goal: Patients Stated Pain Goal: 3 (10/31/22 1109)                 [name] L Kasim Mccorkle

## 2022-11-10 ENCOUNTER — Other Ambulatory Visit (HOSPITAL_COMMUNITY): Payer: Self-pay

## 2022-11-12 ENCOUNTER — Other Ambulatory Visit (HOSPITAL_COMMUNITY): Payer: Self-pay

## 2022-11-14 ENCOUNTER — Other Ambulatory Visit (HOSPITAL_COMMUNITY): Payer: Self-pay

## 2022-11-14 DIAGNOSIS — F4323 Adjustment disorder with mixed anxiety and depressed mood: Secondary | ICD-10-CM | POA: Diagnosis not present

## 2022-11-15 ENCOUNTER — Ambulatory Visit
Admission: EM | Admit: 2022-11-15 | Discharge: 2022-11-15 | Disposition: A | Discharge status: Home / Self Care | Payer: 59 | Attending: Internal Medicine | Admitting: Internal Medicine

## 2022-11-15 ENCOUNTER — Telehealth: Payer: Self-pay | Admitting: Nurse Practitioner

## 2022-11-15 DIAGNOSIS — F411 Generalized anxiety disorder: Secondary | ICD-10-CM | POA: Diagnosis not present

## 2022-11-15 DIAGNOSIS — Z6379 Other stressful life events affecting family and household: Secondary | ICD-10-CM | POA: Diagnosis not present

## 2022-11-15 NOTE — ED Triage Notes (Signed)
[facility] "feeling jittery this morning." Pt took wellbutrin and protonix this morning and felt "flushed and hot all over." She had missed a couple of doses of wellbutrin before this morning. Pt states she started Zoloft again yesterday. Pt reports starting to drink caffeine again yesterday since after having a miscarriage and loss of family member a few weeks ago. States she has not been sleeping well.

## 2022-11-15 NOTE — Discharge Instructions (Addendum)
Follow-up with your PCP regarding reintroducing your Zoloft.  Avoid caffeine, alcohol.  Take time to give yourself some grace to work on your physical and emotional healing given your recent events.  Follow-up with your OB as scheduled.  I do hope you feel better soon!

## 2022-11-15 NOTE — ED Provider Notes (Signed)
[facility]    CSN: [id number] Arrival date & time: 11/15/22  5956      History   Chief Complaint No chief complaint on file.   HPI Amy [name] is a 37 y.o. female presents for concern about medication side effect.  Patient has a history of general anxiety disorder on Wellbutrin and Zoloft.  She has recently had some life stressing events including death of a family member and her recent miscarriage 2 weeks ago.  She states that she has been taking intermittently her Wellbutrin and Zoloft and is not consistent with this.  In conjunction with that she has been drinking caffeine again as well as had some alcohol.  This morning she took her normal medications and when she was drinking water she had a warm sensation throughout her body.  This made her very anxious and concerned it was related to these medications.  No chest pain or shortness of breath.  No suicidal ideations.  She does have an appointment with her OB tomorrow for follow-up after her D&C.  No other concerns at this time.  HPI  Past Medical History:  Diagnosis Date   Anxiety    with flying only   Depression    GERD (gastroesophageal reflux disease)    Herpes    Vaginal Pap smear, abnormal     Patient Active Problem List   Diagnosis Date Noted   Rh(D) positive 10/28/2022   Hypercholesteremia 08/13/2022   Moderate episode of recurrent major depressive disorder (HCC) 06/25/2022   Low back pain 04/16/2022   Post-inflammatory hyperpigmentation 11/18/2020   Gastroesophageal reflux disease 06/22/2020   Genital herpes simplex 06/22/2020   Acanthosis nigricans 01/27/2020   Obesity 01/27/2020   GAD (generalized anxiety disorder) 11/06/2019   Mixed hyperlipidemia 12/07/2018   Radial styloid tenosynovitis of right hand 09/09/2018   Vitamin D deficiency 03/04/2017   Prediabetes 05/31/2016   Insomnia 11/04/2015   Depression, recurrent (HCC) 11/04/2015    Past Surgical History:  Procedure Laterality Date    CERVICAL BIOPSY  W/ LOOP ELECTRODE EXCISION     CESAREAN SECTION N/A 07/20/2018   Procedure: CESAREAN SECTION;  Surgeon: Essie Hart, MD;  Location: [facility];  Service: Obstetrics;  Laterality: N/A;   DILATION AND EVACUATION N/A 10/31/2022   Procedure: DILATATION AND EVACUATION UNDER ULTRASOUND GUIDANCE  WITH Kizzie Fantasia;  Surgeon: Steva Ready, DO;  Location: [facility];  Service: Gynecology;  Laterality: N/A;   WISDOM TOOTH EXTRACTION      OB History     Gravida  2   Para  1   Term  1   Preterm      AB      Living  1      SAB      IAB      Ectopic      Multiple  0   Live Births  1            Home Medications    Prior to Admission medications   Medication Sig Start Date End Date Taking? Authorizing Provider  acetaminophen (TYLENOL) 500 MG tablet Take 1,000 mg by mouth every 6 (six) hours as needed for mild pain, moderate pain or headache.    [provider]  buPROPion (WELLBUTRIN SR) 150 MG 12 hr tablet Take 1 tablet (150 mg total) by mouth 2 (two) times daily. 05/31/22   Nche, Bonna Gains, NP  famotidine (PEPCID) 20 MG tablet Take 1 tablet (20 mg total) by mouth at bedtime.  Patient not taking: Reported on 10/30/2022 09/05/22   Nche, Bonna Gains, NP  hydrOXYzine (VISTARIL) 25 MG capsule Take 25 mg by mouth at bedtime as needed for anxiety.    [provider]  ibuprofen (ADVIL) 800 MG tablet Take 1 tablet (800 mg total) by mouth every 8 (eight) hours as needed for moderate pain, cramping or mild pain. 10/31/22   Steva Ready, DO  loperamide (IMODIUM A-D) 2 MG tablet Take 1 tablet (2 mg total) by mouth 4 (four) times daily as needed for diarrhea or loose stools. Patient not taking: Reported on 10/30/2022 10/28/22   Carlynn Herald, CNM  misoprostol (CYTOTEC) 200 MCG tablet Place four tablets in between your gums and cheeks (two tablets on each side) as instructed Patient not taking: Reported on 10/30/2022 10/28/22   Carlynn Herald, CNM   ondansetron (ZOFRAN-ODT) 4 MG disintegrating tablet Take 1 tablet (4 mg total) by mouth every 8 (eight) hours as needed for nausea or vomiting. 10/28/22   Carlynn Herald, CNM  oxycodone (OXY-IR) 5 MG capsule Take 5 mg by mouth every 4 (four) hours as needed for pain.    [provider]  pantoprazole (PROTONIX) 40 MG tablet Take 1 tablet (40 mg total) by mouth every morning. 09/05/22   Nche, Bonna Gains, NP  Prenatal Multivit-Min-Fe-FA (PRE-NATAL FORMULA PO) Take 1 tablet by mouth daily.    [provider]  sertraline (ZOLOFT) 50 MG tablet TAKE 1 TABLET BY MOUTH EVERY DAY 11/05/22   Nche, Bonna Gains, NP  sodium chloride (OCEAN) 0.65 % SOLN nasal spray Place 1 spray into both nostrils as needed for congestion. 02/23/22   Nche, Bonna Gains, NP  valACYclovir (VALTREX) 500 MG tablet TAKE 1 TABLET (500 MG TOTAL) BY MOUTH DAILY. Patient taking differently: Take 500 mg by mouth daily as needed (Cold sores). 06/11/22   Nche, Bonna Gains, NP    Family History Family History  Problem Relation Age of Onset   Hypertension Mother    Hyperlipidemia Mother    Hyperlipidemia Father    Alcohol abuse Father    Cirrhosis Father    Hypertension Maternal Aunt    Heart disease Paternal Grandmother     Social History Social History   Tobacco Use   Smoking status: Never   Smokeless tobacco: Never  Vaping Use   Vaping status: Never Used  Substance Use Topics   Alcohol use: Not Currently    Alcohol/week: 1.0 standard drink of alcohol    Types: 1 Shots of liquor per week   Drug use: No     Allergies   Patient has no known allergies.   Review of Systems Review of Systems  Psychiatric/Behavioral:  The patient is nervous/anxious.      Physical Exam Triage Vital Signs ED Triage Vitals [11/15/22 1012]  Encounter Vitals Group     BP (!) 140/84     Systolic BP Percentile      Diastolic BP Percentile      Pulse Rate (!) 118     Resp 16     Temp 97.7 F (36.5 C)      Temp Source Oral     SpO2 97 %     Weight      Height      Head Circumference      Peak Flow      Pain Score      Pain Loc      Pain Education      Exclude from [employer]  Chart    No data found.  Updated Vital Signs BP (!) 140/84 (BP Location: Right Arm)   Pulse (!) 118   Temp 97.7 F (36.5 C) (Oral)   Resp 16   LMP 07/16/2022 (Exact Date)   SpO2 97%   Breastfeeding Unknown   Visual Acuity Right Eye Distance:   Left Eye Distance:   Bilateral Distance:    Right Eye Near:   Left Eye Near:    Bilateral Near:     Physical Exam Vitals and nursing note reviewed.  Constitutional:      General: She is not in acute distress.    Appearance: Normal appearance. She is not ill-appearing.  HENT:     Head: Normocephalic and atraumatic.  Eyes:     Pupils: Pupils are equal, round, and reactive to light.  Cardiovascular:     Rate and Rhythm: Tachycardia present.     Comments: Mildly tachycardia 118 in setting of anxiety/stimulant medication Pulmonary:     Effort: Pulmonary effort is normal.  Skin:    General: Skin is warm and dry.  Neurological:     General: No focal deficit present.     Mental Status: She is alert and oriented to person, place, and time.  Psychiatric:        Mood and Affect: Mood normal.        Behavior: Behavior normal.      UC Treatments / Results  Labs (all labs ordered are listed, but only abnormal results are displayed) Labs Reviewed - No data to display  EKG   Radiology No results found.  Procedures Procedures (including critical care time)  Medications Ordered in UC Medications - No data to display  Initial Impression / Assessment and Plan / UC Course  I have reviewed the triage vital signs and the nursing notes.  Pertinent labs & imaging results that were available during my care of the patient were reviewed by me and considered in my medical decision making (see chart for details).     Listened to patient's concerns as well as  her recent life stressors and events.  Discussed with her that her symptoms were likely a manifestation of her anxiety as well as a combination of reintroducing her antidepressant medications.  Discussed avoiding caffeine and alcohol as well as contacting her PCP about safe ways to reintroduce her Zoloft as this is the medication she has been most inconsistent with taking.  Advised also to follow-up with her counselor and discussed giving herself grace/time to physically and emotionally heal from her recent miscarriage.  She will see her OB at her scheduled appointment tomorrow. Final Clinical Impressions(s) / UC Diagnoses   Final diagnoses:  Generalized anxiety disorder  Stressful life event affecting family     Discharge Instructions      Follow-up with your PCP regarding reintroducing your Zoloft.  Avoid caffeine, alcohol.  Take time to give yourself some grace to work on your physical and emotional healing given your recent events.  Follow-up with your OB as scheduled.  I do hope you feel better soon!   ED Prescriptions   None    PDMP not reviewed this encounter.   Radford Pax, NP 11/15/22 1043

## 2022-11-16 ENCOUNTER — Ambulatory Visit (INDEPENDENT_AMBULATORY_CARE_PROVIDER_SITE_OTHER): Payer: 59 | Admitting: Nurse Practitioner

## 2022-11-16 ENCOUNTER — Encounter: Payer: Self-pay | Admitting: Nurse Practitioner

## 2022-11-16 VITALS — BP 126/86 | HR 79 | Temp 97.9°F | Wt 234.6 lb

## 2022-11-16 DIAGNOSIS — F339 Major depressive disorder, recurrent, unspecified: Secondary | ICD-10-CM | POA: Diagnosis not present

## 2022-11-16 DIAGNOSIS — F411 Generalized anxiety disorder: Secondary | ICD-10-CM

## 2022-11-16 NOTE — Telephone Encounter (Signed)
ERROR

## 2022-11-16 NOTE — Assessment & Plan Note (Addendum)
Gradual Worsening mood with death of grandmother, father's poor health and recent miscarriage She stopped taking meds (zoloft, wellbutrin) 2months ago and did not maintain her appointment with therapist. Admits to increase ETOH and caffeine consumption and  in last 2weeks. She resumed wellbutrin 2days ago, but experienced flushing sensation after each dose. She has resumed weekly counseling sessions. She denies any SI/HI/hallucination  Advised to maintain counseling sessions, resume zoloft dose, use vistaril prn for anxiety and sleep, stop wellbutrin, ALCOHOL and caffeine drinks. Advised to switch to decaffeinated drinks if needed. F/up in 2weeks

## 2022-11-16 NOTE — Progress Notes (Signed)
Established Patient Visit  Patient: Amy [name]   DOB: 1985-07-17   37 y.o. Female  MRN: [id number] Visit Date: 11/16/2022  Subjective:    Chief Complaint  Patient presents with   Medical Management of Chronic Issues    Restarted Wellbutrin yesterday and became gittery and a flushed feeling. Was seen at [facility] yesterday. Was told to continue Wellbutrin and to hold zoloft until seeing PCP. Experienced miscarriage 2 weeks ago   HPI Depression, recurrent (HCC) Gradual Worsening mood with death of grandmother, father's poor health and recent miscarriage She stopped taking meds (zoloft, wellbutrin) 2months ago and did not maintain her appointment with therapist. Admits to increase ETOH and caffeine consumption and  in last 2weeks. She resumed wellbutrin 2days ago, but experienced flushing sensation after each dose. She has resumed weekly counseling sessions. She denies any SI/HI/hallucination  Advised to maintain counseling sessions, resume zoloft dose, use vistaril prn for anxiety and sleep, stop wellbutrin, ALCOHOL and caffeine drinks. Advised to switch to decaffeinated drinks if needed. F/up in 2weeks   Reviewed medical, surgical, and social history today  Medications: Outpatient Medications Prior to Visit  Medication Sig   acetaminophen (TYLENOL) 500 MG tablet Take 1,000 mg by mouth every 6 (six) hours as needed for mild pain, moderate pain or headache.   hydrOXYzine (VISTARIL) 25 MG capsule Take 25 mg by mouth every 8 (eight) hours as needed for anxiety.   ibuprofen (ADVIL) 800 MG tablet Take 1 tablet (800 mg total) by mouth every 8 (eight) hours as needed for moderate pain, cramping or mild pain.   ondansetron (ZOFRAN-ODT) 4 MG disintegrating tablet Take 1 tablet (4 mg total) by mouth every 8 (eight) hours as needed for nausea or vomiting.   pantoprazole (PROTONIX) 40 MG tablet Take 1 tablet (40 mg total) by mouth every morning.   Prenatal Multivit-Min-Fe-FA  (PRE-NATAL FORMULA PO) Take 1 tablet by mouth daily.   sertraline (ZOLOFT) 50 MG tablet TAKE 1 TABLET BY MOUTH EVERY DAY   sodium chloride (OCEAN) 0.65 % SOLN nasal spray Place 1 spray into both nostrils as needed for congestion.   valACYclovir (VALTREX) 500 MG tablet TAKE 1 TABLET (500 MG TOTAL) BY MOUTH DAILY. 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).)   [DISCONTINUED] buPROPion (WELLBUTRIN SR) 150 MG 12 hr tablet Take 1 tablet (150 mg total) by mouth 2 (two) times daily.   [DISCONTINUED] oxycodone (OXY-IR) 5 MG capsule Take 5 mg by mouth every 4 (four) hours as needed for pain.   [DISCONTINUED] famotidine (PEPCID) 20 MG tablet Take 1 tablet (20 mg total) by mouth at bedtime. (Patient not taking: Reported on 10/30/2022)   [DISCONTINUED] loperamide (IMODIUM A-D) 2 MG tablet Take 1 tablet (2 mg total) by mouth 4 (four) times daily as needed for diarrhea or loose stools. (Patient not taking: Reported on 10/30/2022)   [DISCONTINUED] misoprostol (CYTOTEC) 200 MCG tablet Place four tablets in between your gums and cheeks (two tablets on each side) as instructed (Patient not taking: Reported on 10/30/2022)   No facility-administered medications prior to visit.   Reviewed past medical and social history.   ROS per HPI above      Objective:  BP 126/86 (BP Location: Left Arm, Patient Position: Sitting, Cuff Size: Large)   Pulse 79   Temp 97.9 F (36.6 C) (Temporal)   Wt 234 lb 9.6 oz (106.4 kg)  LMP 07/16/2022 (Exact Date)   SpO2 98%   BMI 40.27 kg/m      Physical Exam Cardiovascular:     Rate and Rhythm: Normal rate.     Pulses: Normal pulses.  Pulmonary:     Effort: Pulmonary effort is normal.  Neurological:     Mental Status: She is alert and oriented to person, place, and time.  Psychiatric:        Attention and Perception: Attention normal.        Mood and Affect: Mood is depressed.        Speech: Speech normal.        Behavior: Behavior is  cooperative.        Thought Content: Thought content normal.        Cognition and Memory: Cognition and memory normal.     No results found for any visits on 11/16/22.    Assessment & Plan:    Problem List Items Addressed This Visit     Depression, recurrent (HCC) - Primary    Gradual Worsening mood with death of grandmother, father's poor health and recent miscarriage She stopped taking meds (zoloft, wellbutrin) 2months ago and did not maintain her appointment with therapist. Admits to increase ETOH and caffeine consumption and  in last 2weeks. She resumed wellbutrin 2days ago, but experienced flushing sensation after each dose. She has resumed weekly counseling sessions. She denies any SI/HI/hallucination  Advised to maintain counseling sessions, resume zoloft dose, use vistaril prn for anxiety and sleep, stop wellbutrin, ALCOHOL and caffeine drinks. Advised to switch to decaffeinated drinks if needed. F/up in 2weeks        GAD (generalized anxiety disorder)   Return in about 2 weeks (around 11/30/2022) for depression and anxiety.     Alysia Penna, NP

## 2022-11-16 NOTE — Patient Instructions (Addendum)
Stop wellbutrin Start zoloft 25mg  daily x 1week, then 50mg  daily continuously till next appointment with me Stop ALCOHOL and caffeine consumption Call [phone] if any suicidal ideation.

## 2022-11-19 DIAGNOSIS — F4323 Adjustment disorder with mixed anxiety and depressed mood: Secondary | ICD-10-CM | POA: Diagnosis not present

## 2022-12-07 ENCOUNTER — Encounter: Payer: Self-pay | Admitting: Nurse Practitioner

## 2022-12-07 ENCOUNTER — Ambulatory Visit: Payer: 59 | Admitting: Nurse Practitioner

## 2022-12-07 ENCOUNTER — Other Ambulatory Visit (HOSPITAL_COMMUNITY): Payer: Self-pay

## 2022-12-07 VITALS — BP 102/70 | HR 92 | Temp 97.6°F | Ht 64.0 in | Wt 237.2 lb

## 2022-12-07 DIAGNOSIS — R7303 Prediabetes: Secondary | ICD-10-CM | POA: Diagnosis not present

## 2022-12-07 DIAGNOSIS — E66813 Obesity, class 3: Secondary | ICD-10-CM

## 2022-12-07 DIAGNOSIS — E7800 Pure hypercholesterolemia, unspecified: Secondary | ICD-10-CM

## 2022-12-07 DIAGNOSIS — F411 Generalized anxiety disorder: Secondary | ICD-10-CM | POA: Diagnosis not present

## 2022-12-07 DIAGNOSIS — F339 Major depressive disorder, recurrent, unspecified: Secondary | ICD-10-CM | POA: Diagnosis not present

## 2022-12-07 DIAGNOSIS — Z6841 Body Mass Index (BMI) 40.0 and over, adult: Secondary | ICD-10-CM

## 2022-12-07 NOTE — Assessment & Plan Note (Signed)
Improved mood with zoloft 25mg  daily Has maintain CBT sessions EOW due to work schedule. Continues ALCOHOL consumption Denies any caffeine consumption  Increased zoloft to 50mg  daily Advised to avoid ALCOHOL consumption Maintain sessions with therapist F/up in 64month

## 2022-12-07 NOTE — Assessment & Plan Note (Signed)
She want to get pregnant, but also wants to prevent unhealthy weight pain. She plans to resume daily exercise. Agreed to nutritionist referral. Wt Readings from Last 3 Encounters:  12/07/22 237 lb 3.2 oz (107.6 kg)  11/16/22 234 lb 9.6 oz (106.4 kg)  10/31/22 234 lb (106.1 kg)

## 2022-12-07 NOTE — Progress Notes (Signed)
Established Patient Visit  Patient: Amy [name]   DOB: 08-15-1985   37 y.o. Female  MRN: [id number] Visit Date: 12/07/2022  Subjective:    Chief Complaint  Patient presents with   Follow-up   HPI Depression, recurrent (HCC) Improved mood with zoloft 25mg  daily Has maintain CBT sessions EOW due to work schedule. Continues ALCOHOL consumption Denies any caffeine consumption  Increased zoloft to 50mg  daily Advised to avoid ALCOHOL consumption Maintain sessions with therapist F/up in 41month  Obesity She want to get pregnant, but also wants to prevent unhealthy weight pain. She plans to resume daily exercise. Agreed to nutritionist referral. Wt Readings from Last 3 Encounters:  12/07/22 237 lb 3.2 oz (107.6 kg)  11/16/22 234 lb 9.6 oz (106.4 kg)  10/31/22 234 lb (106.1 kg)     Reviewed medical, surgical, and social history today  Medications: Outpatient Medications Prior to Visit  Medication Sig   acetaminophen (TYLENOL) 500 MG tablet Take 1,000 mg by mouth every 6 (six) hours as needed for mild pain, moderate pain or headache.   hydrOXYzine (VISTARIL) 25 MG capsule Take 25 mg by mouth every 8 (eight) hours as needed for anxiety.   ibuprofen (ADVIL) 800 MG tablet Take 1 tablet (800 mg total) by mouth every 8 (eight) hours as needed for moderate pain, cramping or mild pain.   pantoprazole (PROTONIX) 40 MG tablet Take 1 tablet (40 mg total) by mouth every morning.   sertraline (ZOLOFT) 50 MG tablet TAKE 1 TABLET BY MOUTH EVERY DAY   sodium chloride (OCEAN) 0.65 % SOLN nasal spray Place 1 spray into both nostrils as needed for congestion.   valACYclovir (VALTREX) 500 MG tablet TAKE 1 TABLET (500 MG TOTAL) BY MOUTH DAILY. 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).)   ondansetron (ZOFRAN-ODT) 4 MG disintegrating tablet Take 1 tablet (4 mg total) by mouth every 8 (eight) hours as needed for nausea or vomiting. (Patient not  taking: Reported on 12/07/2022)   Prenatal Multivit-Min-Fe-FA (PRE-NATAL FORMULA PO) Take 1 tablet by mouth daily. (Patient not taking: Reported on 12/07/2022)   No facility-administered medications prior to visit.   Reviewed past medical and social history.   ROS per HPI above      Objective:  BP 102/70   Pulse 92   Temp 97.6 F (36.4 C) (Temporal)   Ht 5\' 4"  (1.626 m)   Wt 237 lb 3.2 oz (107.6 kg)   LMP 07/16/2022 (Exact Date) Comment: spotting started yesterday  SpO2 98%   Breastfeeding No   BMI 40.72 kg/m      Physical Exam Vitals and nursing note reviewed.  Cardiovascular:     Rate and Rhythm: Normal rate.     Pulses: Normal pulses.  Pulmonary:     Effort: Pulmonary effort is normal.  Neurological:     Mental Status: She is alert and oriented to person, place, and time.  Psychiatric:        Attention and Perception: Attention normal.        Mood and Affect: Mood is anxious.        Speech: Speech normal.        Behavior: Behavior is cooperative.        Thought Content: Thought content normal.        Cognition and Memory: Cognition and memory normal.     No results found for any visits  on 12/07/22.    Assessment & Plan:    Problem List Items Addressed This Visit     Depression, recurrent (HCC) - Primary    Improved mood with zoloft 25mg  daily Has maintain CBT sessions EOW due to work schedule. Continues ALCOHOL consumption Denies any caffeine consumption  Increased zoloft to 50mg  daily Advised to avoid ALCOHOL consumption Maintain sessions with therapist F/up in 25month      GAD (generalized anxiety disorder)   Hypercholesteremia   Relevant Orders   Amb ref to Medical Nutrition Therapy-[facility]   Obesity    She want to get pregnant, but also wants to prevent unhealthy weight pain. She plans to resume daily exercise. Agreed to nutritionist referral. Wt Readings from Last 3 Encounters:  12/07/22 237 lb 3.2 oz (107.6 kg)  11/16/22 234 lb 9.6 oz (106.4  kg)  10/31/22 234 lb (106.1 kg)         Relevant Orders   Amb ref to Medical Nutrition Therapy-[facility]   Prediabetes   Relevant Orders   Amb ref to Medical Nutrition Therapy-[facility]   Return in about 4 weeks (around 01/04/2023) for depression and anxiety.     Alysia Penna, NP

## 2022-12-07 NOTE — Patient Instructions (Signed)
Increase Zoloft does to 50mg  daily. Maintain sessions with therapist

## 2022-12-28 ENCOUNTER — Ambulatory Visit
Admission: EM | Admit: 2022-12-28 | Discharge: 2022-12-28 | Disposition: A | Discharge status: Home / Self Care | Payer: 59 | Attending: Internal Medicine | Admitting: Internal Medicine

## 2022-12-28 DIAGNOSIS — J988 Other specified respiratory disorders: Secondary | ICD-10-CM | POA: Insufficient documentation

## 2022-12-28 DIAGNOSIS — R070 Pain in throat: Secondary | ICD-10-CM | POA: Diagnosis not present

## 2022-12-28 DIAGNOSIS — B9789 Other viral agents as the cause of diseases classified elsewhere: Secondary | ICD-10-CM | POA: Insufficient documentation

## 2022-12-28 DIAGNOSIS — H9202 Otalgia, left ear: Secondary | ICD-10-CM | POA: Insufficient documentation

## 2022-12-28 LAB — POCT RAPID STREP A (OFFICE): Rapid Strep A Screen: NEGATIVE

## 2022-12-28 MED ORDER — CETIRIZINE HCL 10 MG PO TABS: 10.0000 mg | ORAL_TABLET | Freq: Every day | ORAL | 0 refills | Status: DC

## 2022-12-28 MED ORDER — PSEUDOEPHEDRINE HCL 60 MG PO TABS: 60.0000 mg | ORAL_TABLET | Freq: Three times a day (TID) | ORAL | 0 refills | Status: DC | PRN

## 2022-12-28 MED ORDER — IBUPROFEN 600 MG PO TABS: 600.0000 mg | ORAL_TABLET | Freq: Four times a day (QID) | ORAL | 0 refills | Status: DC | PRN

## 2022-12-28 NOTE — Discharge Instructions (Addendum)
We will manage this as a viral respiratory illness. For sore throat or cough try using a honey-based tea. Use 3 teaspoons of honey with juice squeezed from half lemon. Place shaved pieces of ginger into 1/2-1 cup of water and warm over stove top. Then mix the ingredients and repeat every 4 hours as needed. Please take ibuprofen 600mg  every 6 hours with food alternating with OR taken together with Tylenol 650mg  every 6 hours for throat pain, fevers, aches and pains. Hydrate very well with at least 2 liters of water. Eat light meals such as soups (chicken and noodles, vegetable, chicken and wild rice).  Do not eat foods that you are allergic to.  Taking an antihistamine like Zyrtec (10mg  daily) can help against postnasal drainage, sinus congestion which can cause sinus pain, sinus headaches, throat pain, painful swallowing, coughing.  You can take this together with pseudoephedrine (Sudafed) at a dose of 60 mg 3 times a day or twice daily as needed for the same kind of nasal drip, congestion.

## 2022-12-28 NOTE — ED Triage Notes (Signed)
Pt c/o left earache and sore throat x 2 days-denies fever-last dose ibuprofen 6am-NAD-steady gait

## 2022-12-28 NOTE — ED Provider Notes (Signed)
[facility]  Note:  This document was prepared using Conservation officer, historic buildings and may include unintentional dictation errors.  MRN: [id number] DOB: Nov 19, 1985  Subjective:   Amy [name] is a 37 y.o. female presenting for 2-day history of acute onset persistent throat pain, left ear pain.  No fever, runny or stuffy nose, cough, chest pain, shortness of breath or wheezing.  No history of allergies.  No history of asthma.  No current facility-administered medications for this encounter.  Current Outpatient Medications:    acetaminophen (TYLENOL) 500 MG tablet, Take 1,000 mg by mouth every 6 (six) hours as needed for mild pain, moderate pain or headache., Disp: , Rfl:    hydrOXYzine (VISTARIL) 25 MG capsule, Take 25 mg by mouth every 8 (eight) hours as needed for anxiety., Disp: , Rfl:    ibuprofen (ADVIL) 800 MG tablet, Take 1 tablet (800 mg total) by mouth every 8 (eight) hours as needed for moderate pain, cramping or mild pain., Disp: 30 tablet, Rfl: 0   ondansetron (ZOFRAN-ODT) 4 MG disintegrating tablet, Take 1 tablet (4 mg total) by mouth every 8 (eight) hours as needed for nausea or vomiting. (Patient not taking: Reported on 12/07/2022), Disp: 15 tablet, Rfl: 0   pantoprazole (PROTONIX) 40 MG tablet, Take 1 tablet (40 mg total) by mouth every morning., Disp: 90 tablet, Rfl: 1   Prenatal Multivit-Min-Fe-FA (PRE-NATAL FORMULA PO), Take 1 tablet by mouth daily. (Patient not taking: Reported on 12/07/2022), Disp: , Rfl:    sertraline (ZOLOFT) 50 MG tablet, TAKE 1 TABLET BY MOUTH EVERY DAY, Disp: 90 tablet, Rfl: 3   sodium chloride (OCEAN) 0.65 % SOLN nasal spray, Place 1 spray into both nostrils as needed for congestion., Disp: 15 mL, Rfl: 0   valACYclovir (VALTREX) 500 MG tablet, TAKE 1 TABLET (500 MG TOTAL) BY MOUTH DAILY. 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).), Disp: 90 tablet, Rfl: 1   No Known  Allergies  Past Medical History:  Diagnosis Date   Anxiety    with flying only   Depression    GERD (gastroesophageal reflux disease)    Herpes    Vaginal Pap smear, abnormal      Past Surgical History:  Procedure Laterality Date   CERVICAL BIOPSY  W/ LOOP ELECTRODE EXCISION     CESAREAN SECTION N/A 07/20/2018   Procedure: CESAREAN SECTION;  Surgeon: Essie Hart, MD;  Location: [facility];  Service: Obstetrics;  Laterality: N/A;   DILATION AND EVACUATION N/A 10/31/2022   Procedure: DILATATION AND EVACUATION UNDER ULTRASOUND GUIDANCE  WITH Kizzie Fantasia;  Surgeon: Steva Ready, DO;  Location: [facility];  Service: Gynecology;  Laterality: N/A;   WISDOM TOOTH EXTRACTION      Family History  Problem Relation Age of Onset   Hypertension Mother    Hyperlipidemia Mother    Hyperlipidemia Father    Alcohol abuse Father    Cirrhosis Father    Hypertension Maternal Aunt    Heart disease Paternal Grandmother     Social History   Tobacco Use   Smoking status: Never   Smokeless tobacco: Never  Vaping Use   Vaping status: Never Used  Substance Use Topics   Alcohol use: Not Currently    Comment: occ   Drug use: No    ROS   Objective:   Vitals: BP 113/76 (BP Location: Right Arm)   Pulse 87   Temp 99 F (37.2 C) (Oral)  Resp 16   LMP 12/08/2022   SpO2 96%   Physical Exam Constitutional:      General: She is not in acute distress.    Appearance: Normal appearance. She is well-developed and normal weight. She is not ill-appearing, toxic-appearing or diaphoretic.  HENT:     Head: Normocephalic and atraumatic.     Right Ear: Tympanic membrane, ear canal and external ear normal. No drainage or tenderness. No middle ear effusion. There is no impacted cerumen. Tympanic membrane is not erythematous or bulging.     Left Ear: Tympanic membrane, ear canal and external ear normal. No drainage or tenderness.  No middle ear effusion. There is no impacted cerumen. Tympanic membrane is not  erythematous or bulging.     Nose: Nose normal. No congestion or rhinorrhea.     Mouth/Throat:     Mouth: Mucous membranes are moist. No oral lesions.     Pharynx: No pharyngeal swelling, oropharyngeal exudate, posterior oropharyngeal erythema or uvula swelling.     Tonsils: No tonsillar exudate or tonsillar abscesses. 0 on the right. 0 on the left.  Eyes:     General: No scleral icterus.       Right eye: No discharge.        Left eye: No discharge.     Extraocular Movements: Extraocular movements intact.     Right eye: Normal extraocular motion.     Left eye: Normal extraocular motion.     Conjunctiva/sclera: Conjunctivae normal.  Cardiovascular:     Rate and Rhythm: Normal rate.  Pulmonary:     Effort: Pulmonary effort is normal.  Musculoskeletal:     Cervical back: Normal range of motion and neck supple.  Lymphadenopathy:     Cervical: No cervical adenopathy.  Skin:    General: Skin is warm and dry.  Neurological:     General: No focal deficit present.     Mental Status: She is alert and oriented to person, place, and time.  Psychiatric:        Mood and Affect: Mood normal.        Behavior: Behavior normal.     Results for orders placed or performed during the hospital encounter of 12/28/22 (from the past 24 hour(s))  POCT rapid strep A     Status: None   Collection Time: 12/28/22  3:34 PM  Result Value Ref Range   Rapid Strep A Screen Negative Negative    Assessment and Plan :   PDMP not reviewed this encounter.  1. Viral respiratory infection   2. Throat pain   3. Left ear pain    Strep culture pending.  Suspect viral URI, viral syndrome. Physical exam findings reassuring and vital signs stable for discharge. Advised supportive care, offered symptomatic relief.  No signs of otitis media.  Counseled patient on potential for adverse effects with medications prescribed/recommended today, ER and return-to-clinic precautions discussed, patient verbalized understanding.      Wallis Bamberg, [place] 12/28/22 1308

## 2022-12-31 LAB — CULTURE, GROUP A STREP (THRC)

## 2023-01-10 ENCOUNTER — Other Ambulatory Visit (HOSPITAL_COMMUNITY): Payer: Self-pay

## 2023-01-10 ENCOUNTER — Other Ambulatory Visit: Payer: Self-pay | Admitting: Nurse Practitioner

## 2023-01-10 ENCOUNTER — Ambulatory Visit: Payer: 59 | Admitting: Nurse Practitioner

## 2023-01-10 ENCOUNTER — Other Ambulatory Visit: Payer: Self-pay

## 2023-01-10 DIAGNOSIS — K2100 Gastro-esophageal reflux disease with esophagitis, without bleeding: Secondary | ICD-10-CM

## 2023-01-10 MED ORDER — PANTOPRAZOLE SODIUM 40 MG PO TBEC
40.0000 mg | DELAYED_RELEASE_TABLET | ORAL | 0 refills | Status: DC
  Filled 2023-01-10: qty 90, 90d supply, fill #0

## 2023-01-11 ENCOUNTER — Other Ambulatory Visit (HOSPITAL_COMMUNITY): Payer: Self-pay

## 2023-01-14 ENCOUNTER — Encounter: Payer: Self-pay | Admitting: Nurse Practitioner

## 2023-01-14 ENCOUNTER — Ambulatory Visit (INDEPENDENT_AMBULATORY_CARE_PROVIDER_SITE_OTHER): Payer: 59 | Admitting: Nurse Practitioner

## 2023-01-14 VITALS — BP 109/72 | HR 83 | Temp 98.3°F | Resp 18 | Wt 238.6 lb

## 2023-01-14 DIAGNOSIS — F339 Major depressive disorder, recurrent, unspecified: Secondary | ICD-10-CM | POA: Diagnosis not present

## 2023-01-14 NOTE — Progress Notes (Signed)
Established Patient Visit  Patient: Amy [name]   DOB: 09-28-1985   37 y.o. Female  MRN: [id number] Visit Date: 01/14/2023  Subjective:    Chief Complaint  Patient presents with   OFFICE VISIT     1 month follow up. PT is due for cervical screening. PT stated she may be pregnant due to missed or late menstrual cycle this month; PT did in home test was negative    Depression    Anxiety     Depression        Depression, recurrent (HCC) Reports improved and stable mood with zoloft 50mg  Has missed appts with therapists due to work schedule No caffeine Occasional ALCOHOL intake  Maintain med dose Maintain appts with therapist F/up in 3months   Reviewed medical, surgical, and social history today  Medications: Outpatient Medications Prior to Visit  Medication Sig   acetaminophen (TYLENOL) 500 MG tablet Take 1,000 mg by mouth every 6 (six) hours as needed for mild pain, moderate pain or headache.   hydrOXYzine (VISTARIL) 25 MG capsule Take 25 mg by mouth every 8 (eight) hours as needed for anxiety.   ibuprofen (ADVIL) 600 MG tablet Take 1 tablet (600 mg total) by mouth every 6 (six) hours as needed.   ondansetron (ZOFRAN-ODT) 4 MG disintegrating tablet Take 1 tablet (4 mg total) by mouth every 8 (eight) hours as needed for nausea or vomiting.   pantoprazole (PROTONIX) 40 MG tablet Take 1 tablet (40 mg total) by mouth every morning.   sertraline (ZOLOFT) 50 MG tablet TAKE 1 TABLET BY MOUTH EVERY DAY   sodium chloride (OCEAN) 0.65 % SOLN nasal spray Place 1 spray into both nostrils as needed for congestion.   valACYclovir (VALTREX) 500 MG tablet TAKE 1 TABLET (500 MG TOTAL) BY MOUTH DAILY. 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 (Patient not taking: Reported on 01/14/2023)   [DISCONTINUED] Prenatal Multivit-Min-Fe-FA  (PRE-NATAL FORMULA PO) Take 1 tablet by mouth daily. (Patient not taking: Reported on 01/14/2023)   [DISCONTINUED] pseudoephedrine (SUDAFED) 60 MG tablet Take 1 tablet (60 mg total) by mouth every 8 (eight) hours as needed for congestion. (Patient not taking: Reported on 01/14/2023)   No facility-administered medications prior to visit.   Reviewed past medical and social history.   ROS per HPI above      Objective:  BP 109/72 (BP Location: Left Arm, Patient Position: Sitting, Cuff Size: Large)   Pulse 83   Temp 98.3 F (36.8 C) (Temporal)   Resp 18   Wt 238 lb 9.6 oz (108.2 kg)   LMP 12/08/2022 (Exact Date)   SpO2 98%   BMI 40.96 kg/m      Physical Exam Vitals and nursing note reviewed.  Cardiovascular:     Rate and Rhythm: Normal rate.     Pulses: Normal pulses.  Pulmonary:     Effort: Pulmonary effort is normal.  Neurological:     Mental Status: She is alert and oriented to person, place, and time.  Psychiatric:        Mood and Affect: Mood normal.        Behavior: Behavior normal.        Thought Content: Thought content normal.     No results found for any visits on 01/14/23.  Assessment & Plan:    Problem List Items Addressed This Visit     Depression, recurrent (HCC) - Primary    Reports improved and stable mood with zoloft 50mg  Has missed appts with therapists due to work schedule No caffeine Occasional ALCOHOL intake  Maintain med dose Maintain appts with therapist F/up in 3months      Return in about 3 months (around 04/16/2023) for depression and anxiety, hyperlipidemia (fasting).     Alysia Penna, NP

## 2023-01-14 NOTE — Patient Instructions (Signed)
Maintain current med doses Schedule appointment with GYN for repeat PAP.  Mediterranean Diet A Mediterranean diet is based on the traditions of countries on the [employer]. It focuses on eating more: Fruits and vegetables. Whole grains, beans, nuts, and seeds. Heart-healthy fats. These are fats that are good for your heart. It involves eating less: Dairy. Meat and eggs. Processed foods with added sugar, salt, and fat. This type of diet can help prevent certain conditions. It can also improve outcomes if you have a long-term (chronic) disease, such as kidney or heart disease. What are tips for following this plan? Reading food labels Check packaged foods for: The serving size. For foods such as rice and pasta, the serving size is the amount of cooked product, not dry. The total fat. Avoid foods with saturated fat or trans fat. Added sugars, such as corn syrup. Shopping  Try to have a balanced diet. Buy a variety of foods, such as: Fresh fruits and vegetables. You may be able to get these from local farmers markets. You can also buy them frozen. Grains, beans, nuts, and seeds. Some of these can be bought in bulk. Fresh seafood. Poultry and eggs. Low-fat dairy products. Buy whole ingredients instead of foods that have already been packaged. If you can't get fresh seafood, buy precooked frozen shrimp or canned fish, such as tuna, salmon, or sardines. Stock your pantry so you always have certain foods on hand, such as olive oil, canned tuna, canned tomatoes, rice, pasta, and beans. Cooking Cook foods with extra-virgin olive oil instead of using butter or other vegetable oils. Have meat as a side dish. Have vegetables or grains as your main dish. This means having meat in small portions or adding small amounts of meat to foods like pasta or stew. Use beans or vegetables instead of meat in common dishes like chili or lasagna. Try out different cooking methods. Try roasting,  broiling, steaming, and sauting vegetables. Add frozen vegetables to soups, stews, pasta, or rice. Add nuts or seeds for added healthy fats and plant protein at each meal. You can add these to yogurt, salads, or vegetable dishes. Marinate fish or vegetables using olive oil, lemon juice, garlic, and fresh herbs. Meal planning Plan to eat a vegetarian meal one day each week. Try to work up to two vegetarian meals, if possible. Eat seafood two or more times a week. Have healthy snacks on hand. These may include: Vegetable sticks with hummus. Greek yogurt. Fruit and nut trail mix. Eat balanced meals. These should include: Fruit: 2-3 servings a day. Vegetables: 4-5 servings a day. Low-fat dairy: 2 servings a day. Fish, poultry, or lean meat: 1 serving a day. Beans and legumes: 2 or more servings a week. Nuts and seeds: 1-2 servings a day. Whole grains: 6-8 servings a day. Extra-virgin olive oil: 3-4 servings a day. Limit red meat and sweets to just a few servings a month. Lifestyle  Try to cook and eat meals with your family. Drink enough fluid to keep your pee (urine) pale yellow. Be active every day. This includes: Aerobic exercise, which is exercise that causes your heart to beat faster. Examples include running and swimming. Leisure activities like gardening, walking, or housework. Get 7-8 hours of sleep each night. Drink red wine if your provider says you can. A glass of wine is 5 oz (150 mL). You may be allowed to have: Up to 1 glass a day if you're female and not pregnant. Up to 2 glasses a day  if you're female. What foods should I eat? Fruits Apples. Apricots. Avocado. Berries. Bananas. Cherries. Dates. Figs. Grapes. Lemons. Melon. Oranges. Peaches. Plums. Pomegranate. Vegetables Artichokes. Beets. Broccoli. Cabbage. Carrots. Eggplant. Green beans. Chard. Kale. Spinach. Onions. Leeks. Peas. Squash. Tomatoes. Peppers. Radishes. Grains Whole-grain pasta. Brown rice. Bulgur  wheat. Polenta. Couscous. Whole-wheat bread. Orpah Cobb. Meats and other proteins Beans. Almonds. Sunflower seeds. Pine nuts. Peanuts. Cod. Salmon. Scallops. Shrimp. Tuna. Tilapia. Clams. Oysters. Eggs. Chicken or [place] without skin. Dairy Low-fat milk. Cheese. Greek yogurt. Fats and oils Extra-virgin olive oil. Avocado oil. Grapeseed oil. Beverages Water. Red wine. Herbal tea. Sweets and desserts Greek yogurt with honey. Baked apples. Poached pears. Trail mix. Seasonings and condiments Basil. Cilantro. Coriander. Cumin. Mint. Parsley. Sage. Rosemary. Tarragon. Garlic. Oregano. Thyme. Pepper. Balsamic vinegar. Tahini. Hummus. Tomato sauce. Olives. Mushrooms. The items listed above may not be all the foods and drinks you can have. Talk to a dietitian to learn more. What foods should I limit? This is a list of foods that should be eaten rarely. Fruits Fruit canned in syrup. Vegetables Deep-fried potatoes, like [place] fries. Grains Packaged pasta or rice dishes. Cereal with added sugar. Snacks with added sugar. Meats and other proteins Beef. Pork. Lamb. Chicken or [place] with skin. Hot dogs. Tomasa Blase. Dairy Ice cream. Sour cream. Whole milk. Fats and oils Butter. Canola oil. Vegetable oil. Beef fat (tallow). Lard. Beverages Juice. Sugar-sweetened soft drinks. Beer. Liquor and spirits. Sweets and desserts Cookies. Cakes. Pies. Candy. Seasonings and condiments Mayonnaise. Pre-made sauces and marinades. The items listed above may not be all the foods and drinks you should limit. Talk to a dietitian to learn more. Where to find more information American Heart Association (AHA): heart.org This information is not intended to replace advice given to you by your health care provider. Make sure you discuss any questions you have with your health care provider. Document Revised: 05/20/2022 Document Reviewed: 05/20/2022 Elsevier Patient Education  2024 [employer].

## 2023-01-14 NOTE — Assessment & Plan Note (Addendum)
Reports improved and stable mood with zoloft 50mg  Has missed appts with therapists due to work schedule No caffeine Occasional ALCOHOL intake  Maintain med dose Maintain appts with therapist F/up in 3months

## 2023-02-01 ENCOUNTER — Ambulatory Visit: Payer: 59 | Admitting: Dietician

## 2023-02-04 ENCOUNTER — Encounter: Payer: 59 | Attending: Nurse Practitioner | Admitting: Dietician

## 2023-02-04 DIAGNOSIS — Z6838 Body mass index (BMI) 38.0-38.9, adult: Secondary | ICD-10-CM | POA: Insufficient documentation

## 2023-02-04 DIAGNOSIS — R7303 Prediabetes: Secondary | ICD-10-CM

## 2023-02-04 DIAGNOSIS — E7800 Pure hypercholesterolemia, unspecified: Secondary | ICD-10-CM

## 2023-02-04 DIAGNOSIS — E66812 Obesity, class 2: Secondary | ICD-10-CM

## 2023-02-04 NOTE — Progress Notes (Signed)
Medical Nutrition Therapy  Appointment Start time:  2  Appointment End time:  1503  Primary concerns today: mange appetite and promote body weight reductions Referral diagnosis:  E78.00 (ICD-10-CM) - Hypercholesteremia  R73.03 (ICD-10-CM) - Prediabetes  E66.813,E66.01,Z68.41 (ICD-10-CM) - Class 3 severe obesity due to excess calories with serious comorbidity and body mass index (BMI) of 40.0 to 44.9 in adult [facility])   Preferred learning style: no preference indicated Learning readiness:  ready   NUTRITION ASSESSMENT    Clinical Medical Hx:  Past Medical History:  Diagnosis Date   Anxiety    with flying only   Depression    GERD (gastroesophageal reflux disease)    Herpes    Vaginal Pap smear, abnormal    Medications:  Current Outpatient Medications:    acetaminophen (TYLENOL) 500 MG tablet, Take 1,000 mg by mouth every 6 (six) hours as needed for mild pain, moderate pain or headache., Disp: , Rfl:    hydrOXYzine (VISTARIL) 25 MG capsule, Take 25 mg by mouth every 8 (eight) hours as needed for anxiety., Disp: , Rfl:    ibuprofen (ADVIL) 600 MG tablet, Take 1 tablet (600 mg total) by mouth every 6 (six) hours as needed., Disp: 30 tablet, Rfl: 0   pantoprazole (PROTONIX) 40 MG tablet, Take 1 tablet (40 mg total) by mouth every morning., Disp: 90 tablet, Rfl: 0   sertraline (ZOLOFT) 50 MG tablet, TAKE 1 TABLET BY MOUTH EVERY DAY, Disp: 90 tablet, Rfl: 3   sodium chloride (OCEAN) 0.65 % SOLN nasal spray, Place 1 spray into both nostrils as needed for congestion., Disp: 15 mL, Rfl: 0   valACYclovir (VALTREX) 500 MG tablet, TAKE 1 TABLET (500 MG TOTAL) BY MOUTH DAILY. 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 (Patient not taking: Reported on 02/04/2023), Disp: 15 tablet, Rfl: 0    Labs:  Lab Results  Component Value Date   CHOL 217 (H) 08/13/2022   CHOL 187 10/19/2021   CHOL 187 02/03/2021   Lab Results  Component Value Date   HDL 44.80 08/13/2022   HDL 62.70 10/19/2021   HDL 47.80 02/03/2021   Lab Results  Component Value Date   LDLCALC 152 (H) 08/13/2022   LDLCALC 116 (H) 10/19/2021   LDLCALC 128 (H) 02/03/2021   Lab Results  Component Value Date   TRIG 98.0 08/13/2022   TRIG 42.0 10/19/2021   TRIG 54.0 02/03/2021   Lab Results  Component Value Date   CHOLHDL 5 08/13/2022   CHOLHDL 3 10/19/2021   CHOLHDL 4 02/03/2021   No results found for: "LDLDIRECT"    Lifestyle & Dietary Hx Pt presents today alone. Pt states a desire to manage appetite and promote weight reductions to a lower BMI range. Pt reports she has tried [place] in the past and become ill with nausea and vomiting.  Pt reports she is seeing therapy for depression. Pt reports weight watchers, the gym and [employer] have worked in the past. Pt reports he has recently regained weight unintentionally and this is not desired. 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 Pt reports tracking dietary intake has not been useful for her in the past. Pt reports eating out once daily. Pt reports acid reflux  and stops eating a couple hours before bed. Pt reports both herself and husband share shopping and cooking. Pt reports she lives with her husband and son. Pt reports she has access to gym at work and [employer]. All Pt's questions were answered during this encounter.   Estimated daily fluid intake: 48-64 oz Supplements: none Sleep: 5-6 hours nightly Stress / self-care: 6 out of 10 / screen time, long showers, shopping, music,  Current average weekly physical activity: walks at work   24-Hr Dietary Recall First Meal: skips 5/d/w or eggs, bacon, toast or pancake or waffle house meal  Snack:  Second Meal: ~12-1p: chicken and cheese [place]  sandwich with pepper, onions, mayo, chips, orange soda or skips or  Snack: donuts, chips  Third Meal: grilled chicken, sweet potatoes or ground beef, macaroni and cheese, broccoli or  Snack: none Beverages: water, wine, orange juice, orange soda   NUTRITION DIAGNOSIS  NB-1.1 Food and nutrition-related knowledge deficit As related to limited prior nutrition related education .  As evidenced by Pt reports and dietary recall.   NUTRITION INTERVENTION  Nutrition education (E-1) on the following topics:  Fruits & Vegetables: Aim to fill half your plate with a variety of fruits and vegetables. They are rich in vitamins, minerals, and fiber, and can help reduce the risk of chronic diseases. Choose a colorful assortment of fruits and vegetables to ensure you get a wide range of nutrients. Grains and Starches: Make at least half of your grain choices whole grains, such as brown rice, whole wheat bread, and oats. Whole grains provide fiber, which aids in digestion and healthy cholesterol levels. Aim for whole forms of starchy vegetables such as potatoes, sweet potatoes, beans, peas, and corn, which are fiber rich and provide many vitamins and minerals.  Protein: Incorporate lean sources of protein, such as poultry, fish, beans, nuts, and seeds, into your meals. Protein is essential for building and repairing tissues, staying full, balancing blood sugar, as well as supporting immune function. Dairy: Include low-fat or fat-free dairy products like milk, yogurt, and cheese in your diet. Dairy foods are excellent sources of calcium and vitamin D, which are crucial for bone health.  Physical Activity: Aim for 60 minutes of physical activity daily. Regular physical activity promotes overall health-including helping to reduce risk for heart disease and diabetes, promoting mental health, and helping [place] sleep better.  Nutrition Labels: Selecting Heart Healthy Food Items  Handouts Provided Include  Building a Heart  Healthy Plate  Heart Healthy Label Reading 9 inch Plate Planner with Food List  Learning Style & Readiness for Change Teaching method utilized: Visual & Auditory  Demonstrated degree of understanding via: Teach Back  Barriers to learning/adherence to lifestyle change: unknown  Goals Established by Pt Eat breakfast daily -aim for a carbohydrate plus a protein  MONITORING & EVALUATION Dietary intake, weekly physical activity  Next Steps  Patient is to return PRN.

## 2023-02-04 NOTE — Patient Instructions (Signed)
Eat breakfast daily -aim for a carbohydrate plus a protein

## 2023-02-06 DIAGNOSIS — F4323 Adjustment disorder with mixed anxiety and depressed mood: Secondary | ICD-10-CM | POA: Diagnosis not present

## 2023-02-18 ENCOUNTER — Ambulatory Visit: Payer: 59 | Admitting: Dietician

## 2023-02-27 ENCOUNTER — Other Ambulatory Visit (HOSPITAL_COMMUNITY): Payer: Self-pay

## 2023-02-27 ENCOUNTER — Encounter: Payer: Self-pay | Admitting: Nurse Practitioner

## 2023-02-27 ENCOUNTER — Ambulatory Visit: Payer: Commercial Managed Care - PPO | Admitting: Nurse Practitioner

## 2023-02-27 VITALS — BP 118/72 | HR 90 | Temp 98.3°F | Resp 18 | Ht 64.0 in | Wt 237.8 lb

## 2023-02-27 DIAGNOSIS — E7800 Pure hypercholesterolemia, unspecified: Secondary | ICD-10-CM

## 2023-02-27 DIAGNOSIS — R7303 Prediabetes: Secondary | ICD-10-CM

## 2023-02-27 DIAGNOSIS — Z6841 Body Mass Index (BMI) 40.0 and over, adult: Secondary | ICD-10-CM | POA: Diagnosis not present

## 2023-02-27 DIAGNOSIS — E66813 Obesity, class 3: Secondary | ICD-10-CM | POA: Diagnosis not present

## 2023-02-27 LAB — COMPREHENSIVE METABOLIC PANEL
ALT: 23 U/L (ref 0–35)
AST: 21 U/L (ref 0–37)
Albumin: 4 g/dL (ref 3.5–5.2)
Alkaline Phosphatase: 67 U/L (ref 39–117)
BUN: 14 mg/dL (ref 6–23)
CO2: 27 meq/L (ref 19–32)
Calcium: 8.8 mg/dL (ref 8.4–10.5)
Chloride: 103 meq/L (ref 96–112)
Creatinine, Ser: 0.74 mg/dL (ref 0.40–1.20)
GFR: 104.6 mL/min (ref >60.00)
Glucose, Bld: 87 mg/dL (ref 70–99)
Potassium: 4 meq/L (ref 3.5–5.1)
Sodium: 136 meq/L (ref 135–145)
Total Bilirubin: 0.4 mg/dL (ref 0.2–1.2)
Total Protein: 7.8 g/dL (ref 6.0–8.3)

## 2023-02-27 LAB — LIPID PANEL
Cholesterol: 230 mg/dL — ABNORMAL HIGH (ref 0–200)
HDL: 42.2 mg/dL (ref >39.00)
LDL Cholesterol: 172 mg/dL — ABNORMAL HIGH (ref 0–99)
NonHDL: 187.76
Total CHOL/HDL Ratio: 5
Triglycerides: 80 mg/dL (ref 0.0–149.0)
VLDL: 16 mg/dL (ref 0.0–40.0)

## 2023-02-27 LAB — HCG, QUANTITATIVE, PREGNANCY: Quantitative HCG: <0.6 m[IU]/mL

## 2023-02-27 LAB — HEMOGLOBIN A1C: Hgb A1c MFr Bld: 5.9 % (ref 4.6–6.5)

## 2023-02-27 MED ORDER — CONTRAVE 8-90 MG PO TB12
ORAL_TABLET | ORAL | 1 refills | Status: DC
Start: 2023-02-27 — End: 2023-04-22
  Filled 2023-02-27: qty 60, 30d supply, fill #0
  Filled 2023-04-18: qty 60, 30d supply, fill #1

## 2023-02-27 NOTE — Progress Notes (Signed)
 Established Patient Visit  Patient: Amy [name]   DOB: Aug 07, 1985   38 y.o. Female  MRN: [id number] Visit Date: 02/27/2023  Subjective:    Chief Complaint  Patient presents with   medical managment     PT is requesting weight loss medication; cervical screening due/records requested.    HPI Hypercholesteremia Repeat lipid panel  Prediabetes Repeat hgbA1c  Class 3 severe obesity due to excess calories with serious comorbidity and body mass index (BMI) of 40.0 to 44.9 in adult [facility]) Ms. Will struggles with emotional eating. She had an appointment with a nutritionists. She started a weight watchers program last week. Exercise: cardio and weight training, plans to maintain exercise daily. Meets with therapist every 2weeks. Wt Readings from Last 3 Encounters:  02/27/23 237 lb 12.8 oz (107.9 kg)  01/14/23 238 lb 9.6 oz (108.2 kg)  12/07/22 237 lb 3.2 oz (107.6 kg)    She was unable to tolerate GLP-1 injections in the past. We discussed avoiding phentermine  and Qsymia due to uncontrolled anxiety/depression and does not have a reliable contraception. We discussed use of Contrave  a possible side effects. She agreed to start medication. Check HCG pregnancy today: negative Sent contrave  F/up in 57month  Wt Readings from Last 3 Encounters:  02/27/23 237 lb 12.8 oz (107.9 kg)  01/14/23 238 lb 9.6 oz (108.2 kg)  12/07/22 237 lb 3.2 oz (107.6 kg)    Reviewed medical, surgical, and social history today  Medications: Outpatient Medications Prior to Visit  Medication Sig   acetaminophen  (TYLENOL ) 500 MG tablet Take 1,000 mg by mouth every 6 (six) hours as needed for mild pain, moderate pain or headache.   hydrOXYzine  (VISTARIL ) 25 MG capsule Take 25 mg by mouth every 8 (eight) hours as needed for anxiety.   ibuprofen  (ADVIL ) 600 MG tablet Take 1 tablet (600 mg total) by mouth every 6 (six) hours as needed.   ondansetron  (ZOFRAN -ODT) 4 MG disintegrating tablet Take 1  tablet (4 mg total) by mouth every 8 (eight) hours as needed for nausea or vomiting.   pantoprazole  (PROTONIX ) 40 MG tablet Take 1 tablet (40 mg total) by mouth every morning.   sertraline  (ZOLOFT ) 50 MG tablet TAKE 1 TABLET BY MOUTH EVERY DAY   sodium chloride  (OCEAN) 0.65 % SOLN nasal spray Place 1 spray into both nostrils as needed for congestion.   valACYclovir  (VALTREX ) 500 MG tablet TAKE 1 TABLET (500 MG TOTAL) BY MOUTH DAILY. 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   Reviewed past medical and social history.   ROS per HPI above      Objective:  BP 118/72 (BP Location: Left Arm, Patient Position: Sitting, Cuff Size: Large)   Pulse 90   Temp 98.3 F (36.8 C) (Temporal)   Resp 18   Ht 5' 4 (1.626 m)   Wt 237 lb 12.8 oz (107.9 kg)   LMP 02/26/2023 (Exact Date)   SpO2 96%   BMI 40.82 kg/m      Physical Exam Vitals and nursing note reviewed.  Cardiovascular:     Rate and Rhythm: Normal rate.     Pulses: Normal pulses.  Pulmonary:     Effort: Pulmonary effort is normal.  Neurological:     Mental Status: She is alert and oriented to person, place, and time.     Results for orders  placed or performed in visit on 02/27/23  Comprehensive metabolic panel  Result Value Ref Range   Sodium 136 135 - 145 mEq/L   Potassium 4.0 3.5 - 5.1 mEq/L   Chloride 103 96 - 112 mEq/L   CO2 27 19 - 32 mEq/L   Glucose, Bld 87 70 - 99 mg/dL   BUN 14 6 - 23 mg/dL   Creatinine, Ser 9.25 0.40 - 1.20 mg/dL   Total Bilirubin 0.4 0.2 - 1.2 mg/dL   Alkaline Phosphatase 67 39 - 117 U/L   AST 21 0 - 37 U/L   ALT 23 0 - 35 U/L   Total Protein 7.8 6.0 - 8.3 g/dL   Albumin 4.0 3.5 - 5.2 g/dL   GFR 895.39 >39.99 mL/min   Calcium 8.8 8.4 - 10.5 mg/dL  Hemoglobin J8r  Result Value Ref Range   Hgb A1c MFr Bld 5.9 4.6 - 6.5 %  Lipid panel  Result Value Ref Range   Cholesterol 230 (H) 0 - 200 mg/dL    Triglycerides 19.9 0.0 - 149.0 mg/dL   HDL 57.79 >60.99 mg/dL   VLDL 83.9 0.0 - 59.9 mg/dL   LDL Cholesterol 827 (H) 0 - 99 mg/dL   Total CHOL/HDL Ratio 5    NonHDL 187.76   B-HCG Quant  Result Value Ref Range   Quantitative HCG <0.60 mIU/ml      Assessment & Plan:    Problem List Items Addressed This Visit     Class 3 severe obesity due to excess calories with serious comorbidity and body mass index (BMI) of 40.0 to 44.9 in adult [facility])   [name] struggles with emotional eating. She had an appointment with a nutritionists. She started a weight watchers program last week. Exercise: cardio and weight training, plans to maintain exercise daily. Meets with therapist every 2weeks. Wt Readings from Last 3 Encounters:  02/27/23 237 lb 12.8 oz (107.9 kg)  01/14/23 238 lb 9.6 oz (108.2 kg)  12/07/22 237 lb 3.2 oz (107.6 kg)    She was unable to tolerate GLP-1 injections in the past. We discussed avoiding phentermine  and Qsymia due to uncontrolled anxiety/depression and does not have a reliable contraception. We discussed use of Contrave  a possible side effects. She agreed to start medication. Check HCG pregnancy today: negative Sent contrave  F/up in 45month      Relevant Medications   Naltrexone -buPROPion  HCl ER (CONTRAVE ) 8-90 MG TB12   Other Relevant Orders   Comprehensive metabolic panel (Completed)   B-HCG Quant (Completed)   Hypercholesteremia - Primary   Repeat lipid panel      Relevant Orders   Lipid panel (Completed)   Prediabetes   Repeat hgbA1c      Relevant Orders   Hemoglobin A1c (Completed)   Return in about 4 weeks (around 03/27/2023) for Weight management, depression and anxiety.     [name], NP

## 2023-02-27 NOTE — Assessment & Plan Note (Signed)
>>  ASSESSMENT AND PLAN FOR HYPERCHOLESTEREMIA WRITTEN ON 02/27/2023  3:49 PM BY Laresa Oshiro LUM, NP  Repeat lipid panel

## 2023-02-27 NOTE — Assessment & Plan Note (Signed)
 Repeat hgbA1c

## 2023-02-27 NOTE — Assessment & Plan Note (Signed)
 Repeat lipid panel ?

## 2023-02-27 NOTE — Assessment & Plan Note (Addendum)
 Ms. Dimercurio struggles with emotional eating. She had an appointment with a nutritionists. She started a weight watchers program last week. Exercise: cardio and weight training, plans to maintain exercise daily. Meets with therapist every 2weeks. Wt Readings from Last 3 Encounters:  02/27/23 237 lb 12.8 oz (107.9 kg)  01/14/23 238 lb 9.6 oz (108.2 kg)  12/07/22 237 lb 3.2 oz (107.6 kg)    She was unable to tolerate GLP-1 injections in the past. We discussed avoiding phentermine  and Qsymia due to uncontrolled anxiety/depression and does not have a reliable contraception. We discussed use of Contrave  a possible side effects. She agreed to start medication. Check HCG pregnancy today: negative Sent contrave  F/up in 18month

## 2023-02-27 NOTE — Patient Instructions (Addendum)
Go to lab  Calorie Counting for Weight Loss Calories are units of energy. Your body needs a certain number of calories from food to keep going throughout the day. When you eat or drink more calories than your body needs, your body stores the extra calories mostly as fat. When you eat or drink fewer calories than your body needs, your body burns fat to get the energy it needs. Calorie counting means keeping track of how many calories you eat and drink each day. Calorie counting can be helpful if you need to lose weight. If you eat fewer calories than your body needs, you should lose weight. Ask your health care provider what a healthy weight is for you. For calorie counting to work, you will need to eat the right number of calories each day to lose a healthy amount of weight per week. A dietitian can help you figure out how many calories you need in a day and will suggest ways to reach your calorie goal. A healthy amount of weight to lose each week is usually 1-2 lb (0.5-0.9 kg). This usually means that your daily calorie intake should be reduced by 500-750 calories. Eating 1,200-1,500 calories a day can help most women lose weight. Eating 1,500-1,800 calories a day can help most men lose weight. What do I need to know about calorie counting? Work with your health care provider or dietitian to determine how many calories you should get each day. To meet your daily calorie goal, you will need to: Find out how many calories are in each food that you would like to eat. Try to do this before you eat. Decide how much of the food you plan to eat. Keep a food log. Do this by writing down what you ate and how many calories it had. To successfully lose weight, it is important to balance calorie counting with a healthy lifestyle that includes regular activity. Where do I find calorie information?  The number of calories in a food can be found on a Nutrition Facts label. If a food does not have a Nutrition  Facts label, try to look up the calories online or ask your dietitian for help. Remember that calories are listed per serving. If you choose to have more than one serving of a food, you will have to multiply the calories per serving by the number of servings you plan to eat. For example, the label on a package of bread might say that a serving size is 1 slice and that there are 90 calories in a serving. If you eat 1 slice, you will have eaten 90 calories. If you eat 2 slices, you will have eaten 180 calories. How do I keep a food log? After each time that you eat, record the following in your food log as soon as possible: What you ate. Be sure to include toppings, sauces, and other extras on the food. How much you ate. This can be measured in cups, ounces, or number of items. How many calories were in each food and drink. The total number of calories in the food you ate. Keep your food log near you, such as in a pocket-sized notebook or on an app or website on your mobile phone. Some programs will calculate calories for you and show you how many calories you have left to meet your daily goal. What are some portion-control tips? Know how many calories are in a serving. This will help you know how many servings you can   have of a certain food. Use a measuring cup to measure serving sizes. You could also try weighing out portions on a kitchen scale. With time, you will be able to estimate serving sizes for some foods. Take time to put servings of different foods on your favorite plates or in your favorite bowls and cups so you know what a serving looks like. Try not to eat straight from a food's packaging, such as from a bag or box. Eating straight from the package makes it hard to see how much you are eating and can lead to overeating. Put the amount you would like to eat in a cup or on a plate to make sure you are eating the right portion. Use smaller plates, glasses, and bowls for smaller portions and  to prevent overeating. Try not to multitask. For example, avoid watching TV or using your computer while eating. If it is time to eat, sit down at a table and enjoy your food. This will help you recognize when you are full. It will also help you be more mindful of what and how much you are eating. What are tips for following this plan? Reading food labels Check the calorie count compared with the serving size. The serving size may be smaller than what you are used to eating. Check the source of the calories. Try to choose foods that are high in protein, fiber, and vitamins, and low in saturated fat, trans fat, and sodium. Shopping Read nutrition labels while you shop. This will help you make healthy decisions about which foods to buy. Pay attention to nutrition labels for low-fat or fat-free foods. These foods sometimes have the same number of calories or more calories than the full-fat versions. They also often have added sugar, starch, or salt to make up for flavor that was removed with the fat. Make a grocery list of lower-calorie foods and stick to it. Cooking Try to cook your favorite foods in a healthier way. For example, try baking instead of frying. Use low-fat dairy products. Meal planning Use more fruits and vegetables. One-half of your plate should be fruits and vegetables. Include lean proteins, such as chicken, turkey, and fish. Lifestyle Each week, aim to do one of the following: 150 minutes of moderate exercise, such as walking. 75 minutes of vigorous exercise, such as running. General information Know how many calories are in the foods you eat most often. This will help you calculate calorie counts faster. Find a way of tracking calories that works for you. Get creative. Try different apps or programs if writing down calories does not work for you. What foods should I eat?  Eat nutritious foods. It is better to have a nutritious, high-calorie food, such as an avocado, than a  food with few nutrients, such as a bag of potato chips. Use your calories on foods and drinks that will fill you up and will not leave you hungry soon after eating. Examples of foods that fill you up are nuts and nut butters, vegetables, lean proteins, and high-fiber foods such as whole grains. High-fiber foods are foods with more than 5 g of fiber per serving. Pay attention to calories in drinks. Low-calorie drinks include water and unsweetened drinks. The items listed above may not be a complete list of foods and beverages you can eat. Contact a dietitian for more information. What foods should I limit? Limit foods or drinks that are not good sources of vitamins, minerals, or protein or that are high   in unhealthy fats. These include: Candy. Other sweets. Sodas, specialty coffee drinks, alcohol, and juice. The items listed above may not be a complete list of foods and beverages you should avoid. Contact a dietitian for more information. How do I count calories when eating out? Pay attention to portions. Often, portions are much larger when eating out. Try these tips to keep portions smaller: Consider sharing a meal instead of getting your own. If you get your own meal, eat only half of it. Before you start eating, ask for a container and put half of your meal into it. When available, consider ordering smaller portions from the menu instead of full portions. Pay attention to your food and drink choices. Knowing the way food is cooked and what is included with the meal can help you eat fewer calories. If calories are listed on the menu, choose the lower-calorie options. Choose dishes that include vegetables, fruits, whole grains, low-fat dairy products, and lean proteins. Choose items that are boiled, broiled, grilled, or steamed. Avoid items that are buttered, battered, fried, or served with cream sauce. Items labeled as crispy are usually fried, unless stated otherwise. Choose water, low-fat  milk, unsweetened iced tea, or other drinks without added sugar. If you want an alcoholic beverage, choose a lower-calorie option, such as a glass of wine or light beer. Ask for dressings, sauces, and syrups on the side. These are usually high in calories, so you should limit the amount you eat. If you want a salad, choose a garden salad and ask for grilled meats. Avoid extra toppings such as bacon, cheese, or fried items. Ask for the dressing on the side, or ask for olive oil and vinegar or lemon to use as dressing. Estimate how many servings of a food you are given. Knowing serving sizes will help you be aware of how much food you are eating at restaurants. Where to find more information Centers for Disease Control and Prevention: www.cdc.gov U.[place] Department of Agriculture: myplate.gov Summary Calorie counting means keeping track of how many calories you eat and drink each day. If you eat fewer calories than your body needs, you should lose weight. A healthy amount of weight to lose per week is usually 1-2 lb (0.5-0.9 kg). This usually means reducing your daily calorie intake by 500-750 calories. The number of calories in a food can be found on a Nutrition Facts label. If a food does not have a Nutrition Facts label, try to look up the calories online or ask your dietitian for help. Use smaller plates, glasses, and bowls for smaller portions and to prevent overeating. Use your calories on foods and drinks that will fill you up and not leave you hungry shortly after a meal. This information is not intended to replace advice given to you by your health care provider. Make sure you discuss any questions you have with your health care provider. Document Revised: 03/19/2019 Document Reviewed: 03/19/2019 Elsevier Patient Education  2023 Elsevier Inc.  

## 2023-03-01 ENCOUNTER — Telehealth: Payer: Self-pay | Admitting: Pharmacist

## 2023-03-01 ENCOUNTER — Other Ambulatory Visit (HOSPITAL_COMMUNITY): Payer: Self-pay

## 2023-03-01 NOTE — Telephone Encounter (Signed)
 Pharmacy Patient Advocate Encounter   Received notification from Physician's Office that prior authorization for Contrave  8-90MG  er tablets is required/requested.   Insurance verification completed.   The patient is insured through [facility] .   Per test claim: PA required; PA submitted to above mentioned insurance via CoverMyMeds Key/confirmation #/EOC [id number] Status is pending

## 2023-03-04 ENCOUNTER — Other Ambulatory Visit (HOSPITAL_COMMUNITY): Payer: Self-pay

## 2023-03-05 ENCOUNTER — Encounter: Payer: Self-pay | Admitting: Nurse Practitioner

## 2023-03-05 ENCOUNTER — Other Ambulatory Visit: Payer: Self-pay

## 2023-03-05 DIAGNOSIS — E66813 Obesity, class 3: Secondary | ICD-10-CM

## 2023-03-05 DIAGNOSIS — F4323 Adjustment disorder with mixed anxiety and depressed mood: Secondary | ICD-10-CM | POA: Diagnosis not present

## 2023-03-05 MED ORDER — CONTRAVE 8-90 MG PO TB12: ORAL_TABLET | ORAL | 1 refills | Status: DC

## 2023-03-06 ENCOUNTER — Other Ambulatory Visit: Payer: Self-pay

## 2023-03-06 ENCOUNTER — Other Ambulatory Visit (HOSPITAL_COMMUNITY): Payer: Self-pay

## 2023-03-06 NOTE — Telephone Encounter (Signed)
 Pharmacy Patient Advocate Encounter  Received notification from [facility] that Prior Authorization for Contrave  8-90MG  er tablets has been  See details of approval below:      PA #/Case ID/Reference #: [id number]

## 2023-03-07 ENCOUNTER — Other Ambulatory Visit (HOSPITAL_COMMUNITY): Payer: Self-pay

## 2023-03-25 ENCOUNTER — Ambulatory Visit: Payer: 59 | Admitting: Nurse Practitioner

## 2023-03-26 DIAGNOSIS — F4323 Adjustment disorder with mixed anxiety and depressed mood: Secondary | ICD-10-CM | POA: Diagnosis not present

## 2023-03-27 DIAGNOSIS — J069 Acute upper respiratory infection, unspecified: Secondary | ICD-10-CM | POA: Diagnosis not present

## 2023-03-27 DIAGNOSIS — R509 Fever, unspecified: Secondary | ICD-10-CM | POA: Diagnosis not present

## 2023-04-04 ENCOUNTER — Encounter: Payer: Self-pay | Admitting: Nurse Practitioner

## 2023-04-04 ENCOUNTER — Ambulatory Visit: Payer: Commercial Managed Care - PPO | Admitting: Nurse Practitioner

## 2023-04-16 DIAGNOSIS — F4323 Adjustment disorder with mixed anxiety and depressed mood: Secondary | ICD-10-CM | POA: Diagnosis not present

## 2023-04-18 ENCOUNTER — Other Ambulatory Visit (HOSPITAL_COMMUNITY): Payer: Self-pay

## 2023-04-22 ENCOUNTER — Encounter: Payer: Self-pay | Admitting: Nurse Practitioner

## 2023-04-22 ENCOUNTER — Ambulatory Visit: Payer: Commercial Managed Care - PPO | Admitting: Nurse Practitioner

## 2023-04-22 ENCOUNTER — Other Ambulatory Visit (HOSPITAL_COMMUNITY): Payer: Self-pay

## 2023-04-22 DIAGNOSIS — E66813 Obesity, class 3: Secondary | ICD-10-CM

## 2023-04-22 DIAGNOSIS — Z6841 Body Mass Index (BMI) 40.0 and over, adult: Secondary | ICD-10-CM | POA: Diagnosis not present

## 2023-04-22 MED ORDER — CONTRAVE 8-90 MG PO TB12
1.0000 | ORAL_TABLET | Freq: Two times a day (BID) | ORAL | 1 refills | Status: DC
Start: 2023-04-22 — End: 2023-06-21
  Filled 2023-04-22 – 2023-05-18 (×2): qty 60, 30d supply, fill #0
  Filled 2023-06-18: qty 60, 30d supply, fill #1

## 2023-04-22 NOTE — Patient Instructions (Signed)
 Keep up the good work

## 2023-04-22 NOTE — Assessment & Plan Note (Addendum)
 Diet: following weight watcher program-26pts per day, low carb/high protein meals ( per day with snack). Exercise: 3x/week, cardio and weight training. No adverse effects with contrave Lost 17lbs in last 2months  Wt Readings from Last 3 Encounters:  04/22/23 220 lb 3.2 oz (99.9 kg)  02/27/23 237 lb 12.8 oz (107.9 kg)  01/14/23 238 lb 9.6 oz (108.2 kg)    BP Readings from Last 3 Encounters:  04/22/23 122/76  02/27/23 118/72  01/14/23 109/72    Maintain med dose, refill sent Encouraged to maintain lifestyle modifications F/up in 2months

## 2023-04-22 NOTE — Progress Notes (Signed)
 Established Patient Visit  Patient: Amy [name]   DOB: [birth year]   38 y.o. Female  MRN: [id number] Visit Date: 04/22/2023  Subjective:    Chief Complaint  Patient presents with   Weight Management     4 week f/u     HPI Class 3 severe obesity due to excess calories with serious comorbidity and body mass index (BMI) of 40.0 to 44.9 in adult [facility]) Diet: following weight watcher program-26pts per day, low carb/high protein meals ( per day with snack). Exercise: 3x/week, cardio and weight training. No adverse effects with contrave Lost 17lbs in last 2months  Wt Readings from Last 3 Encounters:  04/22/23 220 lb 3.2 oz (99.9 kg)  02/27/23 237 lb 12.8 oz (107.9 kg)  01/14/23 238 lb 9.6 oz (108.2 kg)    BP Readings from Last 3 Encounters:  04/22/23 122/76  02/27/23 118/72  01/14/23 109/72    Maintain med dose, refill sent Encouraged to maintain lifestyle modifications F/up in 2months     04/22/2023   10:57 AM 02/27/2023    8:47 AM 02/04/2023    2:10 PM  Depression screen PHQ 2/9  Decreased Interest 0 0 0  Down, Depressed, Hopeless 0 0 1  PHQ - 2 Score 0 0 1  Altered sleeping 0    Tired, decreased energy 0    Change in appetite 0    Feeling bad or failure about yourself  0    Trouble concentrating 0    Moving slowly or fidgety/restless 0    Suicidal thoughts 0    PHQ-9 Score 0    Difficult doing work/chores Not difficult at all         04/22/2023   10:58 AM 01/14/2023   10:40 AM 11/16/2022    9:41 AM 08/13/2022   10:08 AM  GAD 7 : Generalized Anxiety Score  Nervous, Anxious, on Edge 0 1 1 0  Control/stop worrying 0 0 1 0  Worry too much - different things 1 1 3 1   Trouble relaxing 0 0 1 0  Restless 0 0 0 0  Easily annoyed or irritable 0 0 0 0  Afraid - awful might happen 0 0 1 0  Total GAD 7 Score 1 2 7 1   Anxiety Difficulty Not difficult at all Not difficult at all Very difficult Not difficult at all     Reviewed medical, surgical, and  social history today  Medications: Outpatient Medications Prior to Visit  Medication Sig   acetaminophen (TYLENOL) 500 MG tablet Take 1,000 mg by mouth every 6 (six) hours as needed for mild pain, moderate pain or headache.   hydrOXYzine (VISTARIL) 25 MG capsule Take 25 mg by mouth every 8 (eight) hours as needed for anxiety.   ibuprofen (ADVIL) 600 MG tablet Take 1 tablet (600 mg total) by mouth every 6 (six) hours as needed.   ondansetron (ZOFRAN-ODT) 4 MG disintegrating tablet Take 1 tablet (4 mg total) by mouth every 8 (eight) hours as needed for nausea or vomiting.   pantoprazole (PROTONIX) 40 MG tablet Take 1 tablet (40 mg total) by mouth every morning.   sertraline (ZOLOFT) 50 MG tablet TAKE 1 TABLET BY MOUTH EVERY DAY   sodium chloride (OCEAN) 0.65 % SOLN nasal spray Place 1 spray into both nostrils as needed for congestion.   valACYclovir (VALTREX) 500 MG tablet TAKE 1 TABLET (500 MG TOTAL) BY MOUTH DAILY. 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   Reviewed past medical and social history.   ROS per HPI above      Objective:  BP 122/76 (BP Location: Left Arm, Patient Position: Sitting, Cuff Size: Large)   Pulse 82   Temp 98.2 F (36.8 C) (Temporal)   Ht 5\' 4"  (1.626 m)   Wt 220 lb 3.2 oz (99.9 kg)   LMP 04/20/2023 (Exact Date)   SpO2 98%   BMI 37.80 kg/m      Physical Exam Vitals and nursing note reviewed.  Cardiovascular:     Rate and Rhythm: Normal rate and regular rhythm.     Pulses: Normal pulses.     Heart sounds: Normal heart sounds.  Pulmonary:     Effort: Pulmonary  effort is normal.     Breath sounds: Normal breath sounds.  Neurological:     Mental Status: She is alert and oriented to person, place, and time.     No results found for any visits on 04/22/23.    Assessment & Plan:    Problem List Items Addressed This Visit     Class 3 severe obesity due to excess calories with serious comorbidity and body mass index (BMI) of 40.0 to 44.9 in adult [facility])   Diet: following weight watcher program-26pts per day, low carb/high protein meals ( per day with snack). Exercise: 3x/week, cardio and weight training. No adverse effects with contrave Lost 17lbs in last 2months  Wt Readings from Last 3 Encounters:  04/22/23 220 lb 3.2 oz (99.9 kg)  02/27/23 237 lb 12.8 oz (107.9 kg)  01/14/23 238 lb 9.6 oz (108.2 kg)    BP Readings from Last 3 Encounters:  04/22/23 122/76  02/27/23 118/72  01/14/23 109/72    Maintain med dose, refill sent Encouraged to maintain lifestyle modifications F/up in 2months      Relevant Medications   Naltrexone-buPROPion HCl ER (CONTRAVE) 8-90 MG TB12   Return in about 2 months (around 06/22/2023) for Weight management, depression and anxiety, hyperlipidemia (fasting).     Alysia Penna, NP

## 2023-05-01 DIAGNOSIS — F4323 Adjustment disorder with mixed anxiety and depressed mood: Secondary | ICD-10-CM | POA: Diagnosis not present

## 2023-05-07 ENCOUNTER — Other Ambulatory Visit: Payer: Self-pay | Admitting: Nurse Practitioner

## 2023-05-07 ENCOUNTER — Other Ambulatory Visit (HOSPITAL_COMMUNITY)
Admission: RE | Admit: 2023-05-07 | Discharge: 2023-05-07 | Disposition: A | Discharge status: Home / Self Care | Source: Ambulatory Visit | Attending: Nurse Practitioner | Admitting: Nurse Practitioner

## 2023-05-07 DIAGNOSIS — N939 Abnormal uterine and vaginal bleeding, unspecified: Secondary | ICD-10-CM | POA: Diagnosis not present

## 2023-05-07 DIAGNOSIS — Z124 Encounter for screening for malignant neoplasm of cervix: Secondary | ICD-10-CM | POA: Diagnosis not present

## 2023-05-09 ENCOUNTER — Encounter: Payer: Self-pay | Admitting: Nurse Practitioner

## 2023-05-09 LAB — CYTOLOGY - PAP
Comment: NEGATIVE
Diagnosis: NEGATIVE
High risk HPV: NEGATIVE

## 2023-05-13 DIAGNOSIS — N939 Abnormal uterine and vaginal bleeding, unspecified: Secondary | ICD-10-CM | POA: Diagnosis not present

## 2023-05-18 ENCOUNTER — Other Ambulatory Visit (HOSPITAL_COMMUNITY): Payer: Self-pay

## 2023-05-18 ENCOUNTER — Other Ambulatory Visit: Payer: Self-pay | Admitting: Nurse Practitioner

## 2023-05-18 DIAGNOSIS — K2100 Gastro-esophageal reflux disease with esophagitis, without bleeding: Secondary | ICD-10-CM

## 2023-05-20 ENCOUNTER — Other Ambulatory Visit (HOSPITAL_COMMUNITY): Payer: Self-pay

## 2023-05-20 MED ORDER — PANTOPRAZOLE SODIUM 40 MG PO TBEC
40.0000 mg | DELAYED_RELEASE_TABLET | ORAL | 0 refills | Status: DC
  Filled 2023-05-20: qty 90, 90d supply, fill #0

## 2023-05-20 NOTE — Telephone Encounter (Signed)
 Medication: Pantoprazole (Protonix) 40 mg  Directions: Take 1 tablet by mouth every morning   Last given: 01/10/23 Number refills: 0 Last o/v: 05/14/23 Follow up: 2 months (around 06/22/23) Labs: 02/27/23

## 2023-05-21 ENCOUNTER — Other Ambulatory Visit (HOSPITAL_COMMUNITY): Payer: Self-pay

## 2023-05-25 ENCOUNTER — Other Ambulatory Visit (HOSPITAL_COMMUNITY): Payer: Self-pay

## 2023-05-27 ENCOUNTER — Other Ambulatory Visit (HOSPITAL_COMMUNITY): Payer: Self-pay

## 2023-05-30 ENCOUNTER — Encounter: Payer: Self-pay | Admitting: Nurse Practitioner

## 2023-06-14 DIAGNOSIS — F4323 Adjustment disorder with mixed anxiety and depressed mood: Secondary | ICD-10-CM | POA: Diagnosis not present

## 2023-06-18 ENCOUNTER — Other Ambulatory Visit (HOSPITAL_COMMUNITY): Payer: Self-pay

## 2023-06-20 ENCOUNTER — Other Ambulatory Visit (HOSPITAL_COMMUNITY): Payer: Self-pay

## 2023-06-21 ENCOUNTER — Encounter: Payer: Self-pay | Admitting: Nurse Practitioner

## 2023-06-21 ENCOUNTER — Ambulatory Visit: Admitting: Nurse Practitioner

## 2023-06-21 VITALS — BP 112/70 | HR 66 | Temp 97.7°F | Ht 64.0 in | Wt 205.2 lb

## 2023-06-21 DIAGNOSIS — F339 Major depressive disorder, recurrent, unspecified: Secondary | ICD-10-CM

## 2023-06-21 DIAGNOSIS — F40243 Fear of flying: Secondary | ICD-10-CM | POA: Diagnosis not present

## 2023-06-21 DIAGNOSIS — R7303 Prediabetes: Secondary | ICD-10-CM

## 2023-06-21 DIAGNOSIS — E782 Mixed hyperlipidemia: Secondary | ICD-10-CM

## 2023-06-21 DIAGNOSIS — Z6835 Body mass index (BMI) 35.0-35.9, adult: Secondary | ICD-10-CM

## 2023-06-21 DIAGNOSIS — E66812 Obesity, class 2: Secondary | ICD-10-CM

## 2023-06-21 LAB — LIPID PANEL
Cholesterol: 200 mg/dL (ref 0–200)
HDL: 41.4 mg/dL (ref >39.00)
LDL Cholesterol: 149 mg/dL — ABNORMAL HIGH (ref 0–99)
NonHDL: 158.64
Total CHOL/HDL Ratio: 5
Triglycerides: 50 mg/dL (ref 0.0–149.0)
VLDL: 10 mg/dL (ref 0.0–40.0)

## 2023-06-21 LAB — HEMOGLOBIN A1C: Hgb A1c MFr Bld: 5.7 % (ref 4.6–6.5)

## 2023-06-21 MED ORDER — METHOCARBAMOL 500 MG PO TABS: 500.0000 mg | ORAL_TABLET | Freq: Three times a day (TID) | ORAL | 0 refills | Status: AC | PRN

## 2023-06-21 MED ORDER — LORAZEPAM 1 MG PO TABS: 1.0000 mg | ORAL_TABLET | Freq: Every day | ORAL | 0 refills | Status: DC | PRN

## 2023-06-21 MED ORDER — IBUPROFEN 600 MG PO TABS: 600.0000 mg | ORAL_TABLET | Freq: Three times a day (TID) | ORAL | 0 refills | Status: DC | PRN

## 2023-06-21 NOTE — Assessment & Plan Note (Addendum)
 She opted to discontinue contrave  due to cost. She lost 15lbs in last 2months Total weight loss: 32lbs in 4months with contrave  Wt Readings from Last 3 Encounters:  06/21/23 205 lb 3.2 oz (93.1 kg)  04/22/23 220 lb 3.2 oz (99.9 kg)  02/27/23 237 lb 12.8 oz (107.9 kg)    Advised to maintain heart healthy diet and daily exercise

## 2023-06-21 NOTE — Assessment & Plan Note (Signed)
Repeat hgbA1c: 5.7% improved

## 2023-06-21 NOTE — Assessment & Plan Note (Signed)
 Stable mood Opted to d/c contrave  due to cost Current use of zoloft  only

## 2023-06-21 NOTE — Progress Notes (Signed)
 Established Patient Visit  Patient: Amy [name]   DOB: [birth year]   38 y.o. Female  MRN: [id number] Visit Date: 06/21/2023  Subjective:    Chief Complaint  Patient presents with   Follow-up   Anxiety   Back Pain   Back Pain This is a new problem. The current episode started 1 to 4 weeks ago. The problem occurs intermittently. The problem has been gradually improving since onset. The pain is present in the lumbar spine. The quality of the pain is described as cramping and aching. The pain radiates to the right thigh. The pain is moderate. The pain is Worse during the night. The symptoms are aggravated by bending, twisting and sitting. Pertinent negatives include no abdominal pain, bladder incontinence, bowel incontinence, dysuria, numbness, paresis, paresthesias, pelvic pain, perianal numbness, tingling or weakness. Risk factors include obesity and poor posture. She has tried NSAIDs and home exercises for the symptoms. The treatment provided mild relief.  gym injury-weight lifting exercise 80lbs deadlift, slowly improving with ibuprofen  and home exercise. Worse with prolonged sitting  Obesity She opted to discontinue contrave  due to cost. She lost 15lbs in last 2months Total weight loss: 32lbs in 4months with contrave  Wt Readings from Last 3 Encounters:  06/21/23 205 lb 3.2 oz (93.1 kg)  04/22/23 220 lb 3.2 oz (99.9 kg)  02/27/23 237 lb 12.8 oz (107.9 kg)    Advised to maintain heart healthy diet and daily exercise  Mixed hyperlipidemia Repeat lipid panel: improved with lifestyle modification  Prediabetes Repeat hgbA1c: 5.7% improved  Depression, recurrent (HCC) Stable mood Opted to d/c contrave  due to cost Current use of zoloft  only  Travel to [place]  next week x 2weeks. Need refill on lorazepam   Wt Readings from Last 3 Encounters:  06/21/23 205 lb 3.2 oz (93.1 kg)  04/22/23 220 lb 3.2 oz (99.9 kg)  02/27/23 237 lb 12.8 oz (107.9 kg)    Reviewed  medical, surgical, and social history today  Medications: Outpatient Medications Prior to Visit  Medication Sig   acetaminophen  (TYLENOL ) 500 MG tablet Take 1,000 mg by mouth every 6 (six) hours as needed for mild pain, moderate pain or headache.   hydrOXYzine  (VISTARIL ) 25 MG capsule Take 25 mg by mouth every 8 (eight) hours as needed for anxiety.   pantoprazole  (PROTONIX ) 40 MG tablet Take 1 tablet (40 mg total) by mouth every morning.   sertraline  (ZOLOFT ) 50 MG tablet TAKE 1 TABLET BY MOUTH EVERY DAY   sodium chloride  (OCEAN) 0.65 % SOLN nasal spray Place 1 spray into both nostrils as needed for congestion.   valACYclovir  (VALTREX ) 500 MG tablet TAKE 1 TABLET (500 MG TOTAL) BY MOUTH DAILY. 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).)   [DISCONTINUED] ibuprofen  (ADVIL ) 600 MG tablet Take 1 tablet (600 mg total) by mouth every 6 (six) hours as needed.   [DISCONTINUED] Naltrexone -buPROPion  HCl ER (CONTRAVE ) 8-90 MG TB12 Take 1 tablet by mouth 2 (two) times daily. (Patient not taking: Reported on 06/21/2023)   [DISCONTINUED] ondansetron  (ZOFRAN -ODT) 4 MG disintegrating tablet Take 1 tablet (4 mg total) by mouth every 8 (eight) hours as needed for nausea or vomiting. (Patient not taking: Reported on 06/21/2023)   No facility-administered medications prior to visit.   Reviewed past medical and social history.   ROS per HPI above      Objective:  BP 112/70   Pulse 66  Temp 97.7 F (36.5 C) (Temporal)   Ht 5\' 4"  (1.626 m)   Wt 205 lb 3.2 oz (93.1 kg)   LMP 06/17/2022 (Exact Date)   SpO2 98%   BMI 35.22 kg/m      Physical Exam Vitals and nursing note reviewed.  Cardiovascular:     Rate and Rhythm: Normal rate and regular rhythm.     Pulses: Normal pulses.     Heart sounds: Normal heart sounds.  Pulmonary:     Effort: Pulmonary effort is normal.     Breath sounds: Normal breath sounds.  Musculoskeletal:     Lumbar back: Tenderness present.  Normal range of motion. Negative right straight leg raise test and negative left straight leg raise test.     Right hip: Normal.     Left hip: Normal.     Right upper leg: Normal.     Left upper leg: Normal.  Neurological:     Mental Status: She is alert and oriented to person, place, and time.     Results for orders placed or performed in visit on 06/21/23  Hemoglobin A1c  Result Value Ref Range   Hgb A1c MFr Bld 5.7 4.6 - 6.5 %  Lipid panel  Result Value Ref Range   Cholesterol 200 0 - 200 mg/dL   Triglycerides 40.9 0.0 - 149.0 mg/dL   HDL 81.19 >14.78 mg/dL   VLDL 29.5 0.0 - 62.1 mg/dL   LDL Cholesterol 308 (H) 0 - 99 mg/dL   Total CHOL/HDL Ratio 5    NonHDL 158.64       Assessment & Plan:    Problem List Items Addressed This Visit     Depression, recurrent (HCC)   Stable mood Opted to d/c contrave  due to cost Current use of zoloft  only      Relevant Medications   LORazepam  (ATIVAN ) 1 MG tablet   methocarbamol (ROBAXIN) 500 MG tablet   ibuprofen  (ADVIL ) 600 MG tablet   Mixed hyperlipidemia   Repeat lipid panel: improved with lifestyle modification      Relevant Orders   Lipid panel (Completed)   Obesity   She opted to discontinue contrave  due to cost. She lost 15lbs in last 2months Total weight loss: 32lbs in 4months with contrave  Wt Readings from Last 3 Encounters:  06/21/23 205 lb 3.2 oz (93.1 kg)  04/22/23 220 lb 3.2 oz (99.9 kg)  02/27/23 237 lb 12.8 oz (107.9 kg)    Advised to maintain heart healthy diet and daily exercise      Prediabetes   Repeat hgbA1c: 5.7% improved      Relevant Orders   Hemoglobin A1c (Completed)   Other Visit Diagnoses       Fear of flying    -  Primary   Relevant Medications   LORazepam  (ATIVAN ) 1 MG tablet      Return in about 6 months (around 12/22/2023) for CPE (fasting).     Kathrene Parents, NP

## 2023-06-21 NOTE — Assessment & Plan Note (Signed)
 Repeat lipid panel: improved with lifestyle modification

## 2023-06-21 NOTE — Patient Instructions (Addendum)
 Start back exercise Do not take lorazepam  and robaxin within 6hrs of each other. Go to lab  Back Exercises The following exercises strengthen the muscles that help to support the trunk (torso) and back. They also help to keep the lower back flexible. Doing these exercises can help to prevent or lessen existing low back pain. If you have back pain or discomfort, try doing these exercises 2-3 times each day or as told by your health care provider. As your pain improves, do them once each day, but increase the number of times that you repeat the steps for each exercise (do more repetitions). To prevent the recurrence of back pain, continue to do these exercises once each day or as told by your health care provider. Do exercises exactly as told by your health care provider and adjust them as directed. It is normal to feel mild stretching, pulling, tightness, or discomfort as you do these exercises, but you should stop right away if you feel sudden pain or your pain gets worse. Exercises Single knee to chest Repeat these steps 3-5 times for each leg: Lie on your back on a firm bed or the floor with your legs extended. Bring one knee to your chest. Your other leg should stay extended and in contact with the floor. Hold your knee in place by grabbing your knee or thigh with both hands and hold. Pull on your knee until you feel a gentle stretch in your lower back or buttocks. Hold the stretch for 10-30 seconds. Slowly release and straighten your leg.  Pelvic tilt Repeat these steps 5-10 times: Lie on your back on a firm bed or the floor with your legs extended. Bend your knees so they are pointing toward the ceiling and your feet are flat on the floor. Tighten your lower abdominal muscles to press your lower back against the floor. This motion will tilt your pelvis so your tailbone points up toward the ceiling instead of pointing to your feet or the floor. With gentle tension and even breathing,  hold this position for 5-10 seconds.  Cat-cow Repeat these steps until your lower back becomes more flexible: Get into a hands-and-knees position on a firm bed or the floor. Keep your hands under your shoulders, and keep your knees under your hips. You may place padding under your knees for comfort. Let your head hang down toward your chest. Contract your abdominal muscles and point your tailbone toward the floor so your lower back becomes rounded like the back of a cat. Hold this position for 5 seconds. Slowly lift your head, let your abdominal muscles relax, and point your tailbone up toward the ceiling so your back forms a sagging arch like the back of a cow. Hold this position for 5 seconds.  Press-ups Repeat these steps 5-10 times: Lie on your abdomen (face-down) on a firm bed or the floor. Place your palms near your head, about shoulder-width apart. Keeping your back as relaxed as possible and keeping your hips on the floor, slowly straighten your arms to raise the top half of your body and lift your shoulders. Do not use your back muscles to raise your upper torso. You may adjust the placement of your hands to make yourself more comfortable. Hold this position for 5 seconds while you keep your back relaxed. Slowly return to lying flat on the floor.  Bridges Repeat these steps 10 times: Lie on your back on a firm bed or the floor. Bend your knees so they  are pointing toward the ceiling and your feet are flat on the floor. Your arms should be flat at your sides, next to your body. Tighten your buttocks muscles and lift your buttocks off the floor until your waist is at almost the same height as your knees. You should feel the muscles working in your buttocks and the back of your thighs. If you do not feel these muscles, slide your feet 1-2 inches (2.5-5 cm) farther away from your buttocks. Hold this position for 3-5 seconds. Slowly lower your hips to the starting position, and allow your  buttocks muscles to relax completely. If this exercise is too easy, try doing it with your arms crossed over your chest. Abdominal crunches Repeat these steps 5-10 times: Lie on your back on a firm bed or the floor with your legs extended. Bend your knees so they are pointing toward the ceiling and your feet are flat on the floor. Cross your arms over your chest. Tip your chin slightly toward your chest without bending your neck. Tighten your abdominal muscles and slowly raise your torso high enough to lift your shoulder blades a tiny bit off the floor. Avoid raising your torso higher than that because it can put too much stress on your lower back and does not help to strengthen your abdominal muscles. Slowly return to your starting position.  Back lifts Repeat these steps 5-10 times: Lie on your abdomen (face-down) with your arms at your sides, and rest your forehead on the floor. Tighten the muscles in your legs and your buttocks. Slowly lift your chest off the floor while you keep your hips pressed to the floor. Keep the back of your head in line with the curve in your back. Your eyes should be looking at the floor. Hold this position for 3-5 seconds. Slowly return to your starting position.  Contact a health care provider if: Your back pain or discomfort gets much worse when you do an exercise. Your worsening back pain or discomfort does not lessen within 2 hours after you exercise. If you have any of these problems, stop doing these exercises right away. Do not do them again unless your health care provider says that you can. Get help right away if: You develop sudden, severe back pain. If this happens, stop doing the exercises right away. Do not do them again unless your health care provider says that you can. This information is not intended to replace advice given to you by your health care provider. Make sure you discuss any questions you have with your health care  provider. Document Revised: 03/11/2022 Document Reviewed: 04/20/2020 Elsevier Patient Education  2024 [employer].

## 2023-06-27 ENCOUNTER — Encounter: Payer: Self-pay | Admitting: Nurse Practitioner

## 2023-06-27 DIAGNOSIS — F40243 Fear of flying: Secondary | ICD-10-CM

## 2023-07-02 MED ORDER — LORAZEPAM 2 MG PO TABS: 2.0000 mg | ORAL_TABLET | Freq: Every day | ORAL | 0 refills | Status: DC | PRN

## 2023-07-04 ENCOUNTER — Other Ambulatory Visit (HOSPITAL_COMMUNITY): Payer: Self-pay

## 2023-07-04 ENCOUNTER — Encounter (INDEPENDENT_AMBULATORY_CARE_PROVIDER_SITE_OTHER): Payer: Self-pay | Admitting: Nurse Practitioner

## 2023-07-04 DIAGNOSIS — E782 Mixed hyperlipidemia: Secondary | ICD-10-CM | POA: Diagnosis not present

## 2023-07-04 DIAGNOSIS — E66812 Obesity, class 2: Secondary | ICD-10-CM

## 2023-07-04 DIAGNOSIS — R7303 Prediabetes: Secondary | ICD-10-CM

## 2023-07-04 DIAGNOSIS — Z6836 Body mass index (BMI) 36.0-36.9, adult: Secondary | ICD-10-CM

## 2023-07-04 MED ORDER — CONTRAVE 8-90 MG PO TB12
1.0000 | ORAL_TABLET | Freq: Two times a day (BID) | ORAL | 2 refills | Status: DC
  Filled 2023-07-04 – 2023-07-22 (×4): qty 60, 30d supply, fill #0
  Filled 2023-10-23: qty 60, 30d supply, fill #1

## 2023-07-04 NOTE — Addendum Note (Signed)
 Addended by: Kathrene Parents L on: 07/04/2023 02:20 PM   Modules accepted: Orders

## 2023-07-04 NOTE — Assessment & Plan Note (Signed)
 Current BMI at 36 Wt Readings from Last 3 Encounters:  07/04/23 214 lb (97.1 kg)  06/21/23 205 lb 3.2 oz (93.1 kg)  04/22/23 220 lb 3.2 oz (99.9 kg)    I sent contrave  as requested I also recommend maintain a mediterranean diet and daily exercise (goal of per week- cardio and weight training). Call the office to schedule a 24month f/up appointment with me.

## 2023-07-04 NOTE — Telephone Encounter (Signed)
Please see the MyChart message reply(ies) for my assessment and plan.  The patient gave consent for this Medical Advice Message and is aware that it may result in a bill to their insurance company as well as the possibility that this may result in a co-payment or deductible. They are an established patient, but are not seeking medical advice exclusively about a problem treated during an in person or video visit in the last 7 days. I did not recommend an in person or video visit within 7 days of my reply.  I spent a total of 10 minutes cumulative time within 7 days through MyChart messaging Lillith Mcneff, NP  

## 2023-07-09 ENCOUNTER — Other Ambulatory Visit (HOSPITAL_COMMUNITY): Payer: Self-pay

## 2023-07-16 DIAGNOSIS — F4323 Adjustment disorder with mixed anxiety and depressed mood: Secondary | ICD-10-CM | POA: Diagnosis not present

## 2023-07-19 ENCOUNTER — Other Ambulatory Visit (HOSPITAL_COMMUNITY): Payer: Self-pay

## 2023-07-22 ENCOUNTER — Other Ambulatory Visit (HOSPITAL_COMMUNITY): Payer: Self-pay

## 2023-07-22 ENCOUNTER — Encounter: Payer: Self-pay | Admitting: Nurse Practitioner

## 2023-07-23 ENCOUNTER — Other Ambulatory Visit (HOSPITAL_COMMUNITY): Payer: Self-pay

## 2023-08-15 ENCOUNTER — Encounter: Payer: 59 | Admitting: Nurse Practitioner

## 2023-08-15 DIAGNOSIS — F4323 Adjustment disorder with mixed anxiety and depressed mood: Secondary | ICD-10-CM | POA: Diagnosis not present

## 2023-08-16 ENCOUNTER — Encounter: Payer: Self-pay | Admitting: Nurse Practitioner

## 2023-08-16 NOTE — Telephone Encounter (Signed)
 Copy of immunizations sent through MyChart

## 2023-08-21 NOTE — Telephone Encounter (Signed)
 Copied from CRM [id number]. Topic: Clinical - Request for Lab/Test Order >> Aug 20, 2023 12:17 PM Taleah C wrote: Reason for CRM: pt called to request an order to be placed to have her Tdap Booster. Please advise patient when ok to schedule pt.

## 2023-08-22 NOTE — Telephone Encounter (Signed)
 Called patient to informed her of Charlotte's comments. Patient stated that she has received the TDAP vaccine from her local pharmacy. She did ask if there were any sooner appointments for a physical. She stated that she is currently scheduled for November for a physical with Roselie and needs one sooner due to it's for school and it has to be done by August. I looked at Charlotte's scheduled with the help of Tillman from the front office and the first available physical is 10/09/23. I offered patient 10/09/23 at 9:00 AM or 10:00 AM and she stated that she needed it done prior to the 18 of August. I informed her that we can schedule for 10/09/23 and place her on the wait list. She thanked me for looking and trying to find a sooner spot for her. She said that she will look into other matters. I told her she may want to look into an urgent care that certain ones will have a special

## 2023-09-10 DIAGNOSIS — Z111 Encounter for screening for respiratory tuberculosis: Secondary | ICD-10-CM | POA: Diagnosis not present

## 2023-09-12 DIAGNOSIS — Z111 Encounter for screening for respiratory tuberculosis: Secondary | ICD-10-CM | POA: Diagnosis not present

## 2023-09-19 DIAGNOSIS — Z01419 Encounter for gynecological examination (general) (routine) without abnormal findings: Secondary | ICD-10-CM | POA: Diagnosis not present

## 2023-10-01 ENCOUNTER — Encounter: Admitting: Nurse Practitioner

## 2023-10-07 DIAGNOSIS — A6004 Herpesviral vulvovaginitis: Secondary | ICD-10-CM | POA: Diagnosis not present

## 2023-10-08 ENCOUNTER — Other Ambulatory Visit: Payer: Self-pay | Admitting: Nurse Practitioner

## 2023-10-08 MED ORDER — VALACYCLOVIR HCL 500 MG PO TABS: 500.0000 mg | ORAL_TABLET | Freq: Every day | ORAL | 5 refills | Status: DC | PRN

## 2023-10-08 NOTE — Addendum Note (Signed)
 Addended by: LENON ROUGHEN on: 10/08/2023 01:33 PM   Modules accepted: Orders

## 2023-10-09 ENCOUNTER — Other Ambulatory Visit (HOSPITAL_COMMUNITY): Payer: Self-pay

## 2023-10-09 ENCOUNTER — Other Ambulatory Visit: Payer: Self-pay | Admitting: Nurse Practitioner

## 2023-10-09 DIAGNOSIS — K2100 Gastro-esophageal reflux disease with esophagitis, without bleeding: Secondary | ICD-10-CM

## 2023-10-09 DIAGNOSIS — F4323 Adjustment disorder with mixed anxiety and depressed mood: Secondary | ICD-10-CM | POA: Diagnosis not present

## 2023-10-09 NOTE — Telephone Encounter (Signed)
Responded to in another encounter.

## 2023-10-14 ENCOUNTER — Encounter: Payer: Self-pay | Admitting: Nurse Practitioner

## 2023-10-14 ENCOUNTER — Ambulatory Visit: Admitting: Nurse Practitioner

## 2023-10-14 ENCOUNTER — Ambulatory Visit: Payer: Self-pay | Admitting: Nurse Practitioner

## 2023-10-14 ENCOUNTER — Other Ambulatory Visit (HOSPITAL_COMMUNITY): Payer: Self-pay

## 2023-10-14 VITALS — BP 122/72 | HR 76 | Ht 64.0 in | Wt 223.8 lb

## 2023-10-14 DIAGNOSIS — M778 Other enthesopathies, not elsewhere classified: Secondary | ICD-10-CM | POA: Diagnosis not present

## 2023-10-14 DIAGNOSIS — E66812 Obesity, class 2: Secondary | ICD-10-CM

## 2023-10-14 DIAGNOSIS — Z6838 Body mass index (BMI) 38.0-38.9, adult: Secondary | ICD-10-CM

## 2023-10-14 DIAGNOSIS — A6004 Herpesviral vulvovaginitis: Secondary | ICD-10-CM | POA: Diagnosis not present

## 2023-10-14 LAB — POCT URINE PREGNANCY: Preg Test, Ur: NEGATIVE

## 2023-10-14 MED ORDER — VALACYCLOVIR HCL 500 MG PO TABS
500.0000 mg | ORAL_TABLET | Freq: Every day | ORAL | 3 refills | Status: DC
  Filled 2023-10-14: qty 90, 90d supply, fill #0

## 2023-10-14 NOTE — Assessment & Plan Note (Signed)
 She did not start contrave  3months ago. Admits to non compliance with diet and increased ALCOHOL use. Exercise: maintain cardio exercise 3x/week. 18Ibs weight gain noted in last 3months. She plans to start nurvaring this months LMP 09/16/2023 Wt Readings from Last 3 Encounters:  10/14/23 223 lb 12.8 oz (101.5 kg)  07/04/23 214 lb (97.1 kg)  06/21/23 205 lb 3.2 oz (93.1 kg)    Urine pregnancy-negative Start contrave  Advised to stop ALCOHOL consumption, resume heart healthy diet and daily exercise Schedule appointment with therapist F/up in 15month

## 2023-10-14 NOTE — Patient Instructions (Addendum)
 Use a wrist brace with spicca splint-off at night, voltaren  gel 2-3x/day. Ok to start contrave  if negative pregnancy test. Ok to take valtrex  500mg  daily

## 2023-10-14 NOTE — Assessment & Plan Note (Signed)
 Report frequent outbreaks-4 in last 3months. She request for suppression therapy to decrease frequency  Valtrex  500mg  daily F/up in 85month

## 2023-10-14 NOTE — Progress Notes (Signed)
 Established Patient Visit  Patient: Amy [name]   DOB: 04-May-1985   38 y.o. Female  MRN: [id number] Visit Date: 10/14/2023  Subjective:    Chief Complaint  Patient presents with   Tingling    Tingling in left hand since IV infusion beginning of Aug pain when wrist is touched/squeezed  Refill Hydroxyzine   Discuss Contrave  and Valtrex     Wrist Pain  The pain is present in the left wrist. This is a new problem. The current episode started 1 to 4 weeks ago. There has been a history of trauma. The problem occurs intermittently. The problem has been gradually improving. The quality of the pain is described as aching and dull. Associated symptoms include tingling. Pertinent negatives include no fever, inability to bear weight, itching, joint locking, joint swelling, limited range of motion, numbness or stiffness. The symptoms are aggravated by activity. She has tried nothing for the symptoms. Family history does not include gout or rheumatoid arthritis. There is no history of diabetes, gout, osteoarthritis or rheumatoid arthritis.  She received IV fluids at a drip bar clinic 10/04/2023, reports left hand paresthesia and dull pain during infusion. After infusion, noticed intermittent left wrist hand radiating to left hand. No weakness. Noticed slow improvement.  Genital herpes simplex Report frequent outbreaks-4 in last 3months. She request for suppression therapy to decrease frequency  Valtrex  500mg  daily F/up in 36month  Obesity She did not start contrave  3months ago. Admits to non compliance with diet and increased ALCOHOL use. Exercise: maintain cardio exercise 3x/week. 18Ibs weight gain noted in last 3months. She plans to start nurvaring this months LMP 09/16/2023 Wt Readings from Last 3 Encounters:  10/14/23 223 lb 12.8 oz (101.5 kg)  07/04/23 214 lb (97.1 kg)  06/21/23 205 lb 3.2 oz (93.1 kg)    Urine pregnancy-negative Start contrave  Advised to stop ALCOHOL  consumption, resume heart healthy diet and daily exercise Schedule appointment with therapist F/up in 36month  Reviewed medical, surgical, and social history today  Medications: Outpatient Medications Prior to Visit  Medication Sig Note   acetaminophen  (TYLENOL ) 500 MG tablet Take 1,000 mg by mouth every 6 (six) hours as needed for mild pain, moderate pain or headache.    hydrOXYzine  (VISTARIL ) 25 MG capsule Take 25 mg by mouth every 8 (eight) hours as needed for anxiety.    ibuprofen  (ADVIL ) 600 MG tablet Take 1 tablet (600 mg total) by mouth every 8 (eight) hours as needed. With food    LORazepam  (ATIVAN ) 2 MG tablet Take 1 tablet (2 mg total) by mouth daily as needed for anxiety.    methocarbamol  (ROBAXIN ) 500 MG tablet Take 1 tablet (500 mg total) by mouth every 8 (eight) hours as needed for muscle spasms.    pantoprazole  (PROTONIX ) 40 MG tablet TAKE 1 TABLET BY MOUTH EVERY DAY IN THE MORNING    sertraline  (ZOLOFT ) 50 MG tablet TAKE 1 TABLET BY MOUTH EVERY DAY    sodium chloride  (OCEAN) 0.65 % SOLN nasal spray Place 1 spray into both nostrils as needed for congestion.    [DISCONTINUED] valACYclovir  (VALTREX ) 500 MG tablet Take 1 tablet (500 mg total) by mouth daily as needed (Cold sores).    Naltrexone -buPROPion  HCl ER (CONTRAVE ) 8-90 MG TB12 Take 1 tablet by mouth 2 (two) times daily. (Patient not taking: Reported on 10/14/2023) 10/14/2023: Patient is holding wants to discuss    NUVARING 0.12-0.015 MG/24HR vaginal ring Place vaginally.  No facility-administered medications prior to visit.   Reviewed past medical and social history.   ROS per HPI above      Objective:  BP 122/72 (BP Location: Left Arm, Patient Position: Sitting, Cuff Size: Large)   Pulse 76   Ht 5' 4 (1.626 m)   Wt 223 lb 12.8 oz (101.5 kg)   LMP 09/13/2023   SpO2 98%   BMI 38.42 kg/m      Physical Exam Vitals and nursing note reviewed.  Constitutional:      Appearance: She is obese.  Cardiovascular:      Rate and Rhythm: Normal rate and regular rhythm.     Pulses: Normal pulses.     Heart sounds: Normal heart sounds.  Pulmonary:     Effort: Pulmonary effort is normal.     Breath sounds: Normal breath sounds.  Musculoskeletal:     Right elbow: Normal.     Left elbow: Normal.     Right forearm: Normal.     Left forearm: Normal.     Right wrist: Normal.     Left wrist: Normal.     Right hand: Normal.     Left hand: Normal.  Neurological:     Mental Status: She is alert and oriented to person, place, and time.     Results for orders placed or performed in visit on 10/14/23  POCT urine pregnancy  Result Value Ref Range   Preg Test, Ur Negative Negative      Assessment & Plan:    Problem List Items Addressed This Visit     Genital herpes simplex   Report frequent outbreaks-4 in last 3months. She request for suppression therapy to decrease frequency  Valtrex  500mg  daily F/up in 36month      Relevant Medications   valACYclovir  (VALTREX ) 500 MG tablet   Obesity - Primary   She did not start contrave  3months ago. Admits to non compliance with diet and increased ALCOHOL use. Exercise: maintain cardio exercise 3x/week. 18Ibs weight gain noted in last 3months. She plans to start nurvaring this months LMP 09/16/2023 Wt Readings from Last 3 Encounters:  10/14/23 223 lb 12.8 oz (101.5 kg)  07/04/23 214 lb (97.1 kg)  06/21/23 205 lb 3.2 oz (93.1 kg)    Urine pregnancy-negative Start contrave  Advised to stop ALCOHOL consumption, resume heart healthy diet and daily exercise Schedule appointment with therapist F/up in 36month      Relevant Orders   POCT urine pregnancy (Completed)   Other Visit Diagnoses       Left wrist tendonitis         Use a wrist brace with spicca splint-off at night, voltaren  gel 2-3x/day.  Return in about 4 weeks (around 11/11/2023) for Weight management.     [name], NP

## 2023-10-16 DIAGNOSIS — F4323 Adjustment disorder with mixed anxiety and depressed mood: Secondary | ICD-10-CM | POA: Diagnosis not present

## 2023-10-23 ENCOUNTER — Other Ambulatory Visit (HOSPITAL_COMMUNITY): Payer: Self-pay

## 2023-10-23 DIAGNOSIS — F4323 Adjustment disorder with mixed anxiety and depressed mood: Secondary | ICD-10-CM | POA: Diagnosis not present

## 2023-10-30 DIAGNOSIS — F4323 Adjustment disorder with mixed anxiety and depressed mood: Secondary | ICD-10-CM | POA: Diagnosis not present

## 2023-11-04 DIAGNOSIS — N760 Acute vaginitis: Secondary | ICD-10-CM | POA: Diagnosis not present

## 2023-11-06 DIAGNOSIS — L309 Dermatitis, unspecified: Secondary | ICD-10-CM | POA: Diagnosis not present

## 2023-11-11 ENCOUNTER — Encounter: Payer: Self-pay | Admitting: Nurse Practitioner

## 2023-11-11 ENCOUNTER — Ambulatory Visit: Admitting: Nurse Practitioner

## 2023-11-11 VITALS — BP 122/76 | HR 77 | Temp 98.7°F | Ht 64.0 in | Wt 220.2 lb

## 2023-11-11 DIAGNOSIS — E66812 Obesity, class 2: Secondary | ICD-10-CM

## 2023-11-11 DIAGNOSIS — F339 Major depressive disorder, recurrent, unspecified: Secondary | ICD-10-CM | POA: Diagnosis not present

## 2023-11-11 DIAGNOSIS — Z6837 Body mass index (BMI) 37.0-37.9, adult: Secondary | ICD-10-CM

## 2023-11-11 DIAGNOSIS — R7303 Prediabetes: Secondary | ICD-10-CM

## 2023-11-11 DIAGNOSIS — E782 Mixed hyperlipidemia: Secondary | ICD-10-CM

## 2023-11-11 DIAGNOSIS — F411 Generalized anxiety disorder: Secondary | ICD-10-CM

## 2023-11-11 DIAGNOSIS — A6004 Herpesviral vulvovaginitis: Secondary | ICD-10-CM

## 2023-11-11 DIAGNOSIS — Z6836 Body mass index (BMI) 36.0-36.9, adult: Secondary | ICD-10-CM

## 2023-11-11 MED ORDER — SERTRALINE HCL 100 MG PO TABS: 100.0000 mg | ORAL_TABLET | Freq: Every day | ORAL | 1 refills | Status: AC

## 2023-11-11 MED ORDER — CONTRAVE 8-90 MG PO TB12
ORAL_TABLET | ORAL | 1 refills | Status: DC
  Filled 2023-11-20: qty 120, 30d supply, fill #0
  Filled 2023-11-20: qty 60, 20d supply, fill #0
  Filled 2023-12-22 – 2023-12-23 (×2): qty 120, 30d supply, fill #1

## 2023-11-11 NOTE — Assessment & Plan Note (Signed)
 Option not to start suppression therapy at this time

## 2023-11-11 NOTE — Progress Notes (Signed)
 Established Patient Visit  Patient: Amy [name]   DOB: October 27, 1985   38 y.o. Female  MRN: [id number] Visit Date: 11/11/2023  Subjective:    Chief Complaint  Patient presents with   Follow-up    4 week follow up for weight management    HPI Obesity 3lbs weight loss in last 39month with contrave  1tab BID. No adverse effects No ALCOHOL consumption in last 39month. Has maintained a low fat/low carb diet Exercise-weight training exercise 3-4xweek Wt Readings from Last 3 Encounters:  11/11/23 220 lb 3.2 oz (99.9 kg)  10/14/23 223 lb 12.8 oz (101.5 kg)  07/04/23 214 lb (97.1 kg)    Increase contrave  to 2tabs in AM and 1tab in PM x 2weeks, then 2tabs in AM and PM continuously F/up in 2months  Genital herpes simplex Option not to start suppression therapy at this time  GAD (generalized anxiety disorder) Reports increased anxiety with school load. Request to increase zoloft  dose to 100mg  daily  New rx sent F/up in 2months   Reviewed medical, surgical, and social history today  Medications: Outpatient Medications Prior to Visit  Medication Sig   acetaminophen  (TYLENOL ) 500 MG tablet Take 1,000 mg by mouth every 6 (six) hours as needed for mild pain, moderate pain or headache.   hydrOXYzine  (VISTARIL ) 25 MG capsule Take 25 mg by mouth every 8 (eight) hours as needed for anxiety.   LORazepam  (ATIVAN ) 2 MG tablet Take 1 tablet (2 mg total) by mouth daily as needed for anxiety.   methocarbamol  (ROBAXIN ) 500 MG tablet Take 1 tablet (500 mg total) by mouth every 8 (eight) hours as needed for muscle spasms.   metroNIDAZOLE (FLAGYL) 500 MG tablet Take 500 mg by mouth 2 (two) times daily.   NUVARING 0.12-0.015 MG/24HR vaginal ring Place vaginally.   pantoprazole  (PROTONIX ) 40 MG tablet TAKE 1 TABLET BY MOUTH EVERY DAY IN THE MORNING   sodium chloride  (OCEAN) 0.65 % SOLN nasal spray Place 1 spray into both nostrils as needed for congestion.   [DISCONTINUED] ibuprofen   (ADVIL ) 600 MG tablet Take 1 tablet (600 mg total) by mouth every 8 (eight) hours as needed. With food   [DISCONTINUED] Naltrexone -buPROPion  HCl ER (CONTRAVE ) 8-90 MG TB12 Take 1 tablet by mouth 2 (two) times daily.   [DISCONTINUED] sertraline  (ZOLOFT ) 50 MG tablet TAKE 1 TABLET BY MOUTH EVERY DAY   [DISCONTINUED] valACYclovir  (VALTREX ) 500 MG tablet Take 1 tablet (500 mg total) by mouth daily.   valACYclovir  (VALTREX ) 500 MG tablet Take 1 tablet (500 mg total) by mouth daily.   No facility-administered medications prior to visit.   Reviewed past medical and social history.   ROS per HPI above      Objective:  BP 122/76 (BP Location: Left Arm, Patient Position: Sitting, Cuff Size: Large)   Pulse 77   Temp 98.7 F (37.1 C) (Oral)   Ht 5' 4 (1.626 m)   Wt 220 lb 3.2 oz (99.9 kg)   LMP 10/17/2023   SpO2 98%   BMI 37.80 kg/m      Physical Exam Vitals and nursing note reviewed.  Constitutional:      Appearance: She is obese.  Cardiovascular:     Rate and Rhythm: Normal rate.     Pulses: Normal pulses.  Pulmonary:     Effort: Pulmonary effort is normal.  Neurological:     Mental Status: She is alert and oriented to person, place,  and time.  Psychiatric:        Mood and Affect: Affect normal. Mood is anxious.        Behavior: Behavior normal.        Thought Content: Thought content normal.     No results found for any visits on 11/11/23.    Assessment & Plan:    Problem List Items Addressed This Visit     Depression, recurrent (HCC)   Relevant Medications   sertraline  (ZOLOFT ) 100 MG tablet   GAD (generalized anxiety disorder)   Reports increased anxiety with school load. Request to increase zoloft  dose to 100mg  daily  New rx sent F/up in 2months      Relevant Medications   sertraline  (ZOLOFT ) 100 MG tablet   Genital herpes simplex   Option not to start suppression therapy at this time      Relevant Medications   metroNIDAZOLE (FLAGYL) 500 MG tablet    valACYclovir  (VALTREX ) 500 MG tablet   Mixed hyperlipidemia   Relevant Medications   Naltrexone -buPROPion  HCl ER (CONTRAVE ) 8-90 MG TB12   Obesity - Primary   3lbs weight loss in last 35month with contrave  1tab BID. No adverse effects No ALCOHOL consumption in last 35month. Has maintained a low fat/low carb diet Exercise-weight training exercise 3-4xweek Wt Readings from Last 3 Encounters:  11/11/23 220 lb 3.2 oz (99.9 kg)  10/14/23 223 lb 12.8 oz (101.5 kg)  07/04/23 214 lb (97.1 kg)    Increase contrave  to 2tabs in AM and 1tab in PM x 2weeks, then 2tabs in AM and PM continuously F/up in 2months      Relevant Medications   Naltrexone -buPROPion  HCl ER (CONTRAVE ) 8-90 MG TB12   Prediabetes   Relevant Medications   Naltrexone -buPROPion  HCl ER (CONTRAVE ) 8-90 MG TB12   Return in about 2 months (around 01/11/2024) for Weight management, CPE (fasting).     [name], NP

## 2023-11-11 NOTE — Assessment & Plan Note (Signed)
 Reports increased anxiety with school load. Request to increase zoloft  dose to 100mg  daily  New rx sent F/up in 2months

## 2023-11-11 NOTE — Patient Instructions (Signed)
 Maintain Heart healthy diet and daily exercise. Make contrave  dose change as discussed

## 2023-11-11 NOTE — Assessment & Plan Note (Addendum)
 3lbs weight loss in last 90month with contrave  1tab BID. No adverse effects No ALCOHOL consumption in last 90month. Has maintained a low fat/low carb diet Exercise-weight training exercise 3-4xweek Wt Readings from Last 3 Encounters:  11/11/23 220 lb 3.2 oz (99.9 kg)  10/14/23 223 lb 12.8 oz (101.5 kg)  07/04/23 214 lb (97.1 kg)    Increase contrave  to 2tabs in AM and 1tab in PM x 2weeks, then 2tabs in AM and PM continuously F/up in 2months

## 2023-11-18 ENCOUNTER — Encounter: Admitting: Nurse Practitioner

## 2023-11-18 DIAGNOSIS — F4323 Adjustment disorder with mixed anxiety and depressed mood: Secondary | ICD-10-CM | POA: Diagnosis not present

## 2023-11-20 ENCOUNTER — Other Ambulatory Visit (HOSPITAL_COMMUNITY): Payer: Self-pay

## 2023-11-29 DIAGNOSIS — F4323 Adjustment disorder with mixed anxiety and depressed mood: Secondary | ICD-10-CM | POA: Diagnosis not present

## 2023-12-23 ENCOUNTER — Other Ambulatory Visit (HOSPITAL_COMMUNITY): Payer: Self-pay

## 2023-12-24 ENCOUNTER — Encounter: Admitting: Nurse Practitioner

## 2024-01-13 ENCOUNTER — Ambulatory Visit: Payer: Self-pay | Admitting: Nurse Practitioner

## 2024-01-13 ENCOUNTER — Ambulatory Visit: Admitting: Nurse Practitioner

## 2024-01-13 ENCOUNTER — Encounter: Payer: Self-pay | Admitting: Nurse Practitioner

## 2024-01-13 ENCOUNTER — Other Ambulatory Visit (HOSPITAL_COMMUNITY): Payer: Self-pay

## 2024-01-13 VITALS — BP 124/72 | HR 80 | Temp 98.1°F | Ht 64.0 in | Wt 219.6 lb

## 2024-01-13 DIAGNOSIS — E559 Vitamin D deficiency, unspecified: Secondary | ICD-10-CM

## 2024-01-13 DIAGNOSIS — Z6837 Body mass index (BMI) 37.0-37.9, adult: Secondary | ICD-10-CM

## 2024-01-13 DIAGNOSIS — E66812 Obesity, class 2: Secondary | ICD-10-CM | POA: Diagnosis not present

## 2024-01-13 DIAGNOSIS — E782 Mixed hyperlipidemia: Secondary | ICD-10-CM

## 2024-01-13 DIAGNOSIS — R7303 Prediabetes: Secondary | ICD-10-CM | POA: Diagnosis not present

## 2024-01-13 DIAGNOSIS — Z0000 Encounter for general adult medical examination without abnormal findings: Secondary | ICD-10-CM | POA: Diagnosis not present

## 2024-01-13 DIAGNOSIS — Z0001 Encounter for general adult medical examination with abnormal findings: Secondary | ICD-10-CM

## 2024-01-13 DIAGNOSIS — F411 Generalized anxiety disorder: Secondary | ICD-10-CM

## 2024-01-13 LAB — VITAMIN D 25 HYDROXY (VIT D DEFICIENCY, FRACTURES): VITD: 10.13 ng/mL — ABNORMAL LOW (ref 30.00–100.00)

## 2024-01-13 LAB — LIPID PANEL
Cholesterol: 234 mg/dL — ABNORMAL HIGH (ref 0–200)
HDL: 52.5 mg/dL (ref >39.00)
LDL Cholesterol: 165 mg/dL — ABNORMAL HIGH (ref 0–99)
NonHDL: 181.04
Total CHOL/HDL Ratio: 4
Triglycerides: 80 mg/dL (ref 0.0–149.0)
VLDL: 16 mg/dL (ref 0.0–40.0)

## 2024-01-13 LAB — COMPREHENSIVE METABOLIC PANEL WITH GFR
ALT: 20 U/L (ref 0–35)
AST: 21 U/L (ref 0–37)
Albumin: 4.3 g/dL (ref 3.5–5.2)
Alkaline Phosphatase: 66 U/L (ref 39–117)
BUN: 13 mg/dL (ref 6–23)
CO2: 30 meq/L (ref 19–32)
Calcium: 9.1 mg/dL (ref 8.4–10.5)
Chloride: 101 meq/L (ref 96–112)
Creatinine, Ser: 0.84 mg/dL (ref 0.40–1.20)
GFR: 89.29 mL/min (ref >60.00)
Glucose, Bld: 87 mg/dL (ref 70–99)
Potassium: 4.5 meq/L (ref 3.5–5.1)
Sodium: 136 meq/L (ref 135–145)
Total Bilirubin: 0.4 mg/dL (ref 0.2–1.2)
Total Protein: 8 g/dL (ref 6.0–8.3)

## 2024-01-13 LAB — TSH: TSH: 1.73 u[IU]/mL (ref 0.35–5.50)

## 2024-01-13 LAB — HEMOGLOBIN A1C: Hgb A1c MFr Bld: 5.6 % (ref 4.6–6.5)

## 2024-01-13 MED ORDER — VITAMIN D (ERGOCALCIFEROL) 1.25 MG (50000 UNIT) PO CAPS
50000.0000 [IU] | ORAL_CAPSULE | ORAL | 0 refills | Status: AC
  Filled 2024-01-13: qty 12, 84d supply, fill #0

## 2024-01-13 MED ORDER — HYDROXYZINE PAMOATE 25 MG PO CAPS: 25.0000 mg | ORAL_CAPSULE | Freq: Every evening | ORAL | 2 refills | Status: DC | PRN

## 2024-01-13 MED ORDER — CONTRAVE 8-90 MG PO TB12: 2.0000 | ORAL_TABLET | Freq: Two times a day (BID) | ORAL | 1 refills | Status: AC

## 2024-01-13 NOTE — Assessment & Plan Note (Addendum)
 Repeat lipid panel hgbA1c at 5.6% normal Normal CMP and Tsh Abnormal lipid panel: It is important to maintain a mediterranean diet, daily exercise, avoid ALCOHOL use; to prevent development of heart disease and fatty liver.

## 2024-01-13 NOTE — Assessment & Plan Note (Signed)
 Improved anxious sensation with use of zoloft  100mg  Reports insomnia due to racing thought at bedtime. Onset with return to school.  Maintain zoloft  dose  Sent hydroxyzine  25mg  at [facility] F/up in 3months

## 2024-01-13 NOTE — Assessment & Plan Note (Signed)
 Lost 1lb in last 2months Current use of contrave  2tabs BID with no adverse effects. She admits to decreased meal portions, but poor meal choices Exercise: weight training and cardio 4x/week Wt Readings from Last 3 Encounters:  01/13/24 219 lb 9.6 oz (99.6 kg)  11/11/23 220 lb 3.2 oz (99.9 kg)  10/14/23 223 lb 12.8 oz (101.5 kg)    Maintain med dose Advised about the importance of heart healthy diet with daily exercise. F/up in 2months

## 2024-01-13 NOTE — Progress Notes (Signed)
 Complete physical exam  Patient: Amy [name]   DOB: [birth year]   38 y.o. Female  MRN: [id number] Visit Date: 01/13/2024  Subjective:    Chief Complaint  Patient presents with   Annual Exam    FASTING  Requesting refill for Hydroxyzine     Amy [name] is a 38 y.o. female who presents today for a complete physical exam. She reports consuming a general diet. Weight trianing and cardio 4x/week She generally feels well. She reports sleeping well. She does have additional problems to discuss today.  Vision:Yes Dental:No STD Screen:No  BP Readings from Last 3 Encounters:  01/13/24 124/72  11/11/23 122/76  10/14/23 122/72   Wt Readings from Last 3 Encounters:  01/13/24 219 lb 9.6 oz (99.6 kg)  11/11/23 220 lb 3.2 oz (99.9 kg)  10/14/23 223 lb 12.8 oz (101.5 kg)   Most recent fall risk assessment:    01/13/2024   10:33 AM  Fall Risk   Falls in the past year? 0  Number falls in past yr: 0  Injury with Fall? 0  Risk for fall due to : No Fall Risks  Follow up Falls evaluation completed   Depression screen:Yes - Depression  Most recent depression screenings:    01/13/2024   10:33 AM 10/14/2023    8:49 AM  PHQ 2/9 Scores  PHQ - 2 Score 0 1  PHQ- 9 Score 3 4      Data saved with a previous flowsheet row definition   HPI  Vitamin D  deficiency Repeat vit. D Very low vit. D: Sent high dose vit. D 50,000IU weekly x 12weeks, then switch to OVER THE COUNTER dose 5000IU daily continuously.   Prediabetes Repeat hgbA1c: normal  Mixed hyperlipidemia Repeat lipid panel hgbA1c at 5.6% normal Normal CMP and Tsh Abnormal lipid panel: It is important to maintain a mediterranean diet, daily exercise, avoid ALCOHOL use; to prevent development of heart disease and fatty liver.   GAD (generalized anxiety disorder) Improved anxious sensation with use of zoloft  100mg  Reports insomnia due to racing thought at bedtime. Onset with return to school.  Maintain zoloft  dose   Sent hydroxyzine  25mg  at [facility] F/up in 3months  Obesity Lost 1lb in last 2months Current use of contrave  2tabs BID with no adverse effects. She admits to decreased meal portions, but poor meal choices Exercise: weight training and cardio 4x/week Wt Readings from Last 3 Encounters:  01/13/24 219 lb 9.6 oz (99.6 kg)  11/11/23 220 lb 3.2 oz (99.9 kg)  10/14/23 223 lb 12.8 oz (101.5 kg)    Maintain med dose Advised about the importance of heart healthy diet with daily exercise. F/up in 2months   Past Medical History:  Diagnosis Date   Anxiety    with flying only   Depression    GERD (gastroesophageal reflux disease)    Herpes    Vaginal Pap smear, abnormal    Past Surgical History:  Procedure Laterality Date   CERVICAL BIOPSY  W/ LOOP ELECTRODE EXCISION     CESAREAN SECTION N/A 07/20/2018   Procedure: CESAREAN SECTION;  Surgeon: Bettina Muskrat, MD;  Location: [facility];  Service: Obstetrics;  Laterality: N/A;   DILATION AND EVACUATION N/A 10/31/2022   Procedure: DILATATION AND EVACUATION UNDER ULTRASOUND GUIDANCE  WITH TOWANA;  Surgeon: Storm Setter, DO;  Location: [facility];  Service: Gynecology;  Laterality: N/A;   WISDOM TOOTH EXTRACTION     Social History   Socioeconomic History   Marital status: Married  Spouse name: Not on file   Number of children: 1   Years of education: Not on file   Highest education level: Not on file  Occupational History   Occupation: Teacher, Adult Education: [employer]    Comment: GI/endo at [employer]  Tobacco Use   Smoking status: Never   Smokeless tobacco: Never  Vaping Use   Vaping status: Never Used  Substance and Sexual Activity   Alcohol use: Not Currently    Comment: occ   Drug use: No   Sexual activity: Yes    Birth control/protection: None  Other Topics Concern   Not on file  Social History Narrative   Not on file   Social Drivers of Health   Financial Resource Strain: Low Risk  (07/08/2018)   Overall Financial Resource Strain  (CARDIA)    Difficulty of Paying Living Expenses: Not hard at all  Food Insecurity: No Food Insecurity (02/04/2023)   Hunger Vital Sign    Worried About Running Out of Food in the Last Year: Never true    Ran Out of Food in the Last Year: Never true  Transportation Needs: Unknown (07/08/2018)   PRAPARE - Administrator, Civil Service (Medical): No    Lack of Transportation (Non-Medical): Not on file  Physical Activity: Not on file  Stress: No Stress Concern Present (07/08/2018)   [employer] of Occupational Health - Occupational Stress Questionnaire    Feeling of Stress : Only a little  Social Connections: Not on file  Intimate Partner Violence: Not At Risk (07/08/2018)   Humiliation, Afraid, Rape, and Kick questionnaire    Fear of Current or Ex-Partner: No    Emotionally Abused: No    Physically Abused: No    Sexually Abused: No   Family Status  Relation Name Status   Mother  Alive   Father  Alive   Sister 2 Alive   Brother 2 Alive   Youth Worker  (Not Specified)   PGM  (Not Specified)       death in 16s due to MI  No partnership data on file   Family History  Problem Relation Age of Onset   Hypertension Mother    Hyperlipidemia Mother    Hyperlipidemia Father    Alcohol abuse Father    Cirrhosis Father    Hypertension Maternal Aunt    Heart disease Paternal Grandmother    No Known Allergies  Patient Care Team: Abdur Hoglund, Roselie Rockford, NP as PCP - General (Internal Medicine) Gynecology, [facility] (Obstetrics and Gynecology)   Medications: Outpatient Medications Prior to Visit  Medication Sig   acetaminophen  (TYLENOL ) 500 MG tablet Take 1,000 mg by mouth every 6 (six) hours as needed for mild pain, moderate pain or headache.   methocarbamol  (ROBAXIN ) 500 MG tablet Take 1 tablet (500 mg total) by mouth every 8 (eight) hours as needed for muscle spasms.   metroNIDAZOLE (FLAGYL) 500 MG tablet Take 500 mg by mouth 2 (two) times daily.   NUVARING  0.12-0.015 MG/24HR vaginal ring Place vaginally.   pantoprazole  (PROTONIX ) 40 MG tablet TAKE 1 TABLET BY MOUTH EVERY DAY IN THE MORNING   sertraline  (ZOLOFT ) 100 MG tablet Take 1 tablet (100 mg total) by mouth daily.   sodium chloride  (OCEAN) 0.65 % SOLN nasal spray Place 1 spray into both nostrils as needed for congestion.   valACYclovir  (VALTREX ) 500 MG tablet Take 1 tablet (500 mg total) by mouth daily.   [DISCONTINUED] hydrOXYzine  (VISTARIL ) 25  MG capsule Take 25 mg by mouth every 8 (eight) hours as needed for anxiety.   [DISCONTINUED] LORazepam  (ATIVAN ) 2 MG tablet Take 1 tablet (2 mg total) by mouth daily as needed for anxiety.   [DISCONTINUED] Naltrexone -buPROPion  HCl ER (CONTRAVE ) 8-90 MG TB12 Take 2 tablets by mouth in morning and take 1 tablet in the evening for 2 weeks.  Then take 2 tablets in the morning and 2 tablets in the evening thereafter.   No facility-administered medications prior to visit.    Review of Systems  Constitutional:  Negative for activity change, appetite change and unexpected weight change.  Respiratory: Negative.    Cardiovascular: Negative.   Gastrointestinal: Negative.   Endocrine: Negative for cold intolerance and heat intolerance.  Genitourinary: Negative.   Musculoskeletal: Negative.   Skin: Negative.   Neurological: Negative.   Hematological: Negative.   Psychiatric/Behavioral:  Negative for behavioral problems, decreased concentration, dysphoric mood, hallucinations, self-injury, sleep disturbance and suicidal ideas. The patient is not nervous/anxious.         Objective:  BP 124/72 (BP Location: Left Arm, Patient Position: Sitting, Cuff Size: Large)   Pulse 80   Temp 98.1 F (36.7 C) (Oral)   Ht 5' 4 (1.626 m)   Wt 219 lb 9.6 oz (99.6 kg)   LMP 01/09/2024   SpO2 97%   BMI 37.69 kg/m     Physical Exam Vitals and nursing note reviewed.  Constitutional:      General: She is not in acute distress.    Appearance: She is obese.  HENT:      Right Ear: Tympanic membrane, ear canal and external ear normal.     Left Ear: Tympanic membrane, ear canal and external ear normal.     Nose: Nose normal.  Eyes:     Extraocular Movements: Extraocular movements intact.     Conjunctiva/sclera: Conjunctivae normal.     Pupils: Pupils are equal, round, and reactive to light.  Neck:     Thyroid : No thyroid  mass, thyromegaly or thyroid  tenderness.  Cardiovascular:     Rate and Rhythm: Normal rate and regular rhythm.     Pulses: Normal pulses.     Heart sounds: Normal heart sounds.  Pulmonary:     Effort: Pulmonary effort is normal.     Breath sounds: Normal breath sounds.  Abdominal:     General: Bowel sounds are normal.     Palpations: Abdomen is soft.  Musculoskeletal:        General: Normal range of motion.     Cervical back: Normal range of motion and neck supple.     Right lower leg: No edema.     Left lower leg: No edema.  Lymphadenopathy:     Cervical: No cervical adenopathy.  Skin:    General: Skin is warm and dry.  Neurological:     Mental Status: She is alert and oriented to person, place, and time.     Cranial Nerves: No cranial nerve deficit.  Psychiatric:        Mood and Affect: Mood normal.        Behavior: Behavior normal.        Thought Content: Thought content normal.      Results for orders placed or performed in visit on 01/13/24  Hemoglobin A1c  Result Value Ref Range   Hgb A1c MFr Bld 5.6 4.6 - 6.5 %  Lipid panel  Result Value Ref Range   Cholesterol 234 (H) 0 - 200 mg/dL  Triglycerides 80.0 0.0 - 149.0 mg/dL   HDL 47.49 >60.99 mg/dL   VLDL 83.9 0.0 - 59.9 mg/dL   LDL Cholesterol 834 (H) 0 - 99 mg/dL   Total CHOL/HDL Ratio 4    NonHDL 181.04   Comprehensive metabolic panel with GFR  Result Value Ref Range   Sodium 136 135 - 145 mEq/L   Potassium 4.5 3.5 - 5.1 mEq/L   Chloride 101 96 - 112 mEq/L   CO2 30 19 - 32 mEq/L   Glucose, Bld 87 70 - 99 mg/dL   BUN 13 6 - 23 mg/dL   Creatinine,  Ser 9.15 0.40 - 1.20 mg/dL   Total Bilirubin 0.4 0.2 - 1.2 mg/dL   Alkaline Phosphatase 66 39 - 117 U/L   AST 21 0 - 37 U/L   ALT 20 0 - 35 U/L   Total Protein 8.0 6.0 - 8.3 g/dL   Albumin 4.3 3.5 - 5.2 g/dL   GFR 10.70 >39.99 mL/min   Calcium 9.1 8.4 - 10.5 mg/dL  VITAMIN D  25 Hydroxy (Vit-D Deficiency, Fractures)  Result Value Ref Range   VITD 10.13 (L) 30.00 - 100.00 ng/mL  TSH  Result Value Ref Range   TSH 1.73 0.35 - 5.50 uIU/mL      Assessment & Plan:    Routine Health Maintenance and Physical Exam  Immunization History  Administered Date(s) Administered   Hep B, Unspecified 10/02/2012   Influenza Inj Mdck Quad Pf 03/23/2022   Influenza, Seasonal, Injecte, Preservative Fre 12/19/2022, 12/09/2023   Influenza,inj,Quad PF,6+ Mos 11/18/2020   Influenza-Unspecified 12/25/2012, 11/24/2013, 11/19/2014, 11/07/2016, 11/19/2016, 11/18/2017, 11/04/2018, 11/18/2018, 11/05/2019, 11/23/2019, 11/18/2020, 12/28/2022   PFIZER(Purple Top)SARS-COV-2 Vaccination 02/09/2019, 02/18/2019, 03/09/2019, 12/15/2019   PPD Test 09/10/2023   Tdap 08/08/2012, 04/22/2018, 08/22/2023    Health Maintenance  Topic Date Due   Hepatitis B Vaccines 19-59 Average Risk (1 of 3 - 19+ 3-dose series) Never done   COVID-19 Vaccine (5 - 2025-26 season) 02/19/2024 (Originally 10/21/2023)   HPV VACCINES (1 - 3-dose SCDM series) 01/12/2025 (Originally 06/10/2014)   Hepatitis C Screening  01/12/2025 (Originally 06/09/2005)   Cervical Cancer Screening (HPV/Pap Cotest)  05/06/2028   DTaP/Tdap/Td (4 - Td or Tdap) 08/21/2033   Influenza Vaccine  Completed   HIV Screening  Completed   Pneumococcal Vaccine  Aged Out   Meningococcal B Vaccine  Aged Out    Discussed health benefits of physical activity, and encouraged her to engage in regular exercise appropriate for her age and condition.  Problem List Items Addressed This Visit     GAD (generalized anxiety disorder)   Improved anxious sensation with use of zoloft   100mg  Reports insomnia due to racing thought at bedtime. Onset with return to school.  Maintain zoloft  dose  Sent hydroxyzine  25mg  at [facility] F/up in 3months      Relevant Medications   hydrOXYzine  (VISTARIL ) 25 MG capsule   Mixed hyperlipidemia   Repeat lipid panel hgbA1c at 5.6% normal Normal CMP and Tsh Abnormal lipid panel: It is important to maintain a mediterranean diet, daily exercise, avoid ALCOHOL use; to prevent development of heart disease and fatty liver.       Relevant Medications   Naltrexone -buPROPion  HCl ER (CONTRAVE ) 8-90 MG TB12   Other Relevant Orders   Lipid panel (Completed)   TSH (Completed)   Obesity   Lost 1lb in last 2months Current use of contrave  2tabs BID with no adverse effects. She admits to decreased meal portions, but poor meal choices Exercise: weight training  and cardio 4x/week Wt Readings from Last 3 Encounters:  01/13/24 219 lb 9.6 oz (99.6 kg)  11/11/23 220 lb 3.2 oz (99.9 kg)  10/14/23 223 lb 12.8 oz (101.5 kg)    Maintain med dose Advised about the importance of heart healthy diet with daily exercise. F/up in 2months      Relevant Medications   Naltrexone -buPROPion  HCl ER (CONTRAVE ) 8-90 MG TB12   Other Relevant Orders   TSH (Completed)   Prediabetes   Repeat hgbA1c: normal      Relevant Medications   Naltrexone -buPROPion  HCl ER (CONTRAVE ) 8-90 MG TB12   Other Relevant Orders   Hemoglobin A1c (Completed)   Vitamin D  deficiency   Repeat vit. D Very low vit. D: Sent high dose vit. D 50,000IU weekly x 12weeks, then switch to OVER THE COUNTER dose 5000IU daily continuously.       Relevant Orders   VITAMIN D  25 Hydroxy (Vit-D Deficiency, Fractures) (Completed)   Other Visit Diagnoses       Encounter for preventative adult health care exam with abnormal findings    -  Primary   Relevant Orders   Comprehensive metabolic panel with GFR (Completed)      Return in about 2 months (around 03/14/2024) for Weight management,  depression and anxiety.     [name], NP

## 2024-01-13 NOTE — Assessment & Plan Note (Addendum)
 Repeat hgbA1c: normal

## 2024-01-13 NOTE — Assessment & Plan Note (Addendum)
 Repeat vit. D Very low vit. D: Sent high dose vit. D 50,000IU weekly x 12weeks, then switch to OVER THE COUNTER dose 5000IU daily continuously.

## 2024-01-13 NOTE — Patient Instructions (Signed)
 Go to lab Maintain Heart healthy diet and daily exercise. Maintain current medications.

## 2024-01-21 ENCOUNTER — Encounter: Payer: Self-pay | Admitting: Nurse Practitioner

## 2024-01-30 ENCOUNTER — Encounter: Payer: Self-pay | Admitting: Nurse Practitioner

## 2024-01-30 ENCOUNTER — Other Ambulatory Visit (HOSPITAL_COMMUNITY): Payer: Self-pay

## 2024-01-30 DIAGNOSIS — F40243 Fear of flying: Secondary | ICD-10-CM

## 2024-01-30 MED ORDER — LORAZEPAM 2 MG PO TABS: 2.0000 mg | ORAL_TABLET | Freq: Every day | ORAL | 0 refills | Status: AC | PRN

## 2024-01-30 MED ORDER — LORAZEPAM 2 MG PO TABS
2.0000 mg | ORAL_TABLET | Freq: Every day | ORAL | 0 refills | Status: DC | PRN
  Filled 2024-01-30: qty 2, 2d supply, fill #0

## 2024-02-11 ENCOUNTER — Other Ambulatory Visit: Payer: Self-pay | Admitting: Nurse Practitioner

## 2024-02-11 DIAGNOSIS — F411 Generalized anxiety disorder: Secondary | ICD-10-CM

## 2024-03-27 ENCOUNTER — Ambulatory Visit: Admitting: Nurse Practitioner

## 2024-06-05 ENCOUNTER — Ambulatory Visit: Admitting: Nurse Practitioner
